# Patient Record
Sex: Female | Born: 1980
Health system: Southern US, Community
[De-identification: ages and names within clinical notes are randomized; demographics above are authoritative.]

## PROBLEM LIST (undated history)

## (undated) ENCOUNTER — Ambulatory Visit: Admission: EM

## (undated) ENCOUNTER — Emergency Department (HOSPITAL_COMMUNITY): Payer: Medicaid Other

## (undated) DIAGNOSIS — Z9889 Other specified postprocedural states: Secondary | ICD-10-CM

## (undated) DIAGNOSIS — F99 Mental disorder, not otherwise specified: Secondary | ICD-10-CM

## (undated) DIAGNOSIS — R002 Palpitations: Secondary | ICD-10-CM

## (undated) DIAGNOSIS — F419 Anxiety disorder, unspecified: Secondary | ICD-10-CM

## (undated) DIAGNOSIS — O139 Gestational [pregnancy-induced] hypertension without significant proteinuria, unspecified trimester: Secondary | ICD-10-CM

## (undated) DIAGNOSIS — R011 Cardiac murmur, unspecified: Secondary | ICD-10-CM

## (undated) DIAGNOSIS — Z9289 Personal history of other medical treatment: Secondary | ICD-10-CM

## (undated) DIAGNOSIS — K219 Gastro-esophageal reflux disease without esophagitis: Secondary | ICD-10-CM

## (undated) HISTORY — DX: Cardiac murmur, unspecified: R01.1

## (undated) HISTORY — DX: Gestational (pregnancy-induced) hypertension without significant proteinuria, unspecified trimester: O13.9

## (undated) HISTORY — DX: Palpitations: R00.2

## (undated) HISTORY — PX: DENTAL SURGERY: SHX609

## (undated) HISTORY — PX: INDUCED ABORTION: SHX677

---

## 2001-10-02 ENCOUNTER — Emergency Department (HOSPITAL_COMMUNITY): Admission: EM | Admit: 2001-10-02 | Discharge: 2001-10-02 | Payer: Self-pay | Admitting: Emergency Medicine

## 2001-10-02 ENCOUNTER — Encounter: Payer: Self-pay | Admitting: Emergency Medicine

## 2003-06-15 ENCOUNTER — Emergency Department (HOSPITAL_COMMUNITY): Admission: EM | Admit: 2003-06-15 | Discharge: 2003-06-15 | Payer: Self-pay | Admitting: *Deleted

## 2004-05-29 ENCOUNTER — Emergency Department (HOSPITAL_COMMUNITY): Admission: EM | Admit: 2004-05-29 | Discharge: 2004-05-29 | Payer: Self-pay | Admitting: *Deleted

## 2006-07-19 ENCOUNTER — Inpatient Hospital Stay (HOSPITAL_COMMUNITY): Admission: EM | Admit: 2006-07-19 | Discharge: 2006-07-23 | Payer: Self-pay | Admitting: Obstetrics and Gynecology

## 2006-10-06 ENCOUNTER — Emergency Department (HOSPITAL_COMMUNITY): Admission: EM | Admit: 2006-10-06 | Discharge: 2006-10-06 | Payer: Self-pay | Admitting: Emergency Medicine

## 2007-05-31 ENCOUNTER — Emergency Department (HOSPITAL_COMMUNITY): Admission: EM | Admit: 2007-05-31 | Discharge: 2007-05-31 | Payer: Self-pay | Admitting: Emergency Medicine

## 2007-12-13 ENCOUNTER — Emergency Department (HOSPITAL_COMMUNITY): Admission: EM | Admit: 2007-12-13 | Discharge: 2007-12-13 | Payer: Self-pay | Admitting: Emergency Medicine

## 2008-02-04 ENCOUNTER — Other Ambulatory Visit: Admission: RE | Admit: 2008-02-04 | Discharge: 2008-02-04 | Payer: Self-pay | Admitting: Obstetrics and Gynecology

## 2008-03-13 ENCOUNTER — Emergency Department (HOSPITAL_COMMUNITY): Admission: EM | Admit: 2008-03-13 | Discharge: 2008-03-13 | Payer: Self-pay | Admitting: Emergency Medicine

## 2008-11-30 ENCOUNTER — Emergency Department (HOSPITAL_COMMUNITY): Admission: EM | Admit: 2008-11-30 | Discharge: 2008-11-30 | Payer: Self-pay | Admitting: Emergency Medicine

## 2009-06-18 ENCOUNTER — Other Ambulatory Visit: Admission: RE | Admit: 2009-06-18 | Discharge: 2009-06-18 | Payer: Self-pay | Admitting: Obstetrics and Gynecology

## 2010-04-10 ENCOUNTER — Emergency Department (HOSPITAL_COMMUNITY): Admission: EM | Admit: 2010-04-10 | Discharge: 2010-04-10 | Payer: Self-pay | Admitting: Emergency Medicine

## 2010-04-12 ENCOUNTER — Emergency Department (HOSPITAL_COMMUNITY): Admission: EM | Admit: 2010-04-12 | Discharge: 2010-04-12 | Payer: Self-pay | Admitting: Emergency Medicine

## 2010-04-23 ENCOUNTER — Emergency Department (HOSPITAL_COMMUNITY): Admission: EM | Admit: 2010-04-23 | Discharge: 2010-04-23 | Payer: Self-pay | Admitting: Emergency Medicine

## 2010-06-15 DIAGNOSIS — Z9289 Personal history of other medical treatment: Secondary | ICD-10-CM

## 2010-06-15 HISTORY — DX: Personal history of other medical treatment: Z92.89

## 2010-06-20 ENCOUNTER — Ambulatory Visit: Payer: Self-pay | Admitting: Cardiology

## 2010-06-27 ENCOUNTER — Encounter: Payer: Self-pay | Admitting: Cardiology

## 2010-06-27 ENCOUNTER — Ambulatory Visit (HOSPITAL_COMMUNITY): Admission: RE | Admit: 2010-06-27 | Discharge: 2010-06-27 | Payer: Self-pay | Admitting: Cardiology

## 2010-06-27 ENCOUNTER — Ambulatory Visit: Payer: Self-pay | Admitting: Cardiology

## 2010-06-27 ENCOUNTER — Ambulatory Visit: Payer: Self-pay

## 2010-07-01 ENCOUNTER — Emergency Department (HOSPITAL_COMMUNITY): Admission: EM | Admit: 2010-07-01 | Discharge: 2010-07-01 | Payer: Self-pay | Admitting: Emergency Medicine

## 2010-07-08 ENCOUNTER — Ambulatory Visit: Payer: Self-pay | Admitting: Cardiology

## 2010-08-09 ENCOUNTER — Emergency Department (HOSPITAL_COMMUNITY): Admission: EM | Admit: 2010-08-09 | Discharge: 2010-08-10 | Payer: Self-pay | Admitting: Emergency Medicine

## 2010-10-12 ENCOUNTER — Inpatient Hospital Stay (HOSPITAL_COMMUNITY)
Admission: AD | Admit: 2010-10-12 | Discharge: 2010-10-12 | Payer: Self-pay | Source: Home / Self Care | Attending: Obstetrics & Gynecology | Admitting: Obstetrics & Gynecology

## 2010-10-12 LAB — URINALYSIS, ROUTINE W REFLEX MICROSCOPIC
Bilirubin Urine: NEGATIVE
Ketones, ur: NEGATIVE mg/dL
Nitrite: NEGATIVE
Protein, ur: NEGATIVE mg/dL
Specific Gravity, Urine: 1.015 (ref 1.005–1.030)
Urine Glucose, Fasting: NEGATIVE mg/dL
Urobilinogen, UA: 0.2 mg/dL (ref 0.0–1.0)
pH: 7 (ref 5.0–8.0)

## 2010-10-12 LAB — WET PREP, GENITAL
Clue Cells Wet Prep HPF POC: NONE SEEN
Trich, Wet Prep: NONE SEEN
Yeast Wet Prep HPF POC: NONE SEEN

## 2010-10-12 LAB — URINE MICROSCOPIC-ADD ON

## 2010-10-13 LAB — URINE CULTURE
Colony Count: 25000
Culture  Setup Time: 201201281740

## 2010-10-14 LAB — GC/CHLAMYDIA PROBE AMP, GENITAL
Chlamydia, DNA Probe: NEGATIVE
GC Probe Amp, Genital: NEGATIVE

## 2010-10-15 ENCOUNTER — Ambulatory Visit (HOSPITAL_COMMUNITY)
Admission: RE | Admit: 2010-10-15 | Discharge: 2010-10-15 | Payer: Self-pay | Source: Home / Self Care | Attending: Obstetrics and Gynecology | Admitting: Obstetrics and Gynecology

## 2010-11-26 LAB — CBC
HCT: 39.8 % (ref 36.0–46.0)
Hemoglobin: 14.1 g/dL (ref 12.0–15.0)
MCH: 32.1 pg (ref 26.0–34.0)
MCHC: 35.4 g/dL (ref 30.0–36.0)
MCV: 90.7 fL (ref 78.0–100.0)
Platelets: 273 10*3/uL (ref 150–400)
RBC: 4.39 MIL/uL (ref 3.87–5.11)
RDW: 12.5 % (ref 11.5–15.5)
WBC: 6.9 10*3/uL (ref 4.0–10.5)

## 2010-11-26 LAB — DIFFERENTIAL
Basophils Absolute: 0 10*3/uL (ref 0.0–0.1)
Basophils Relative: 0 % (ref 0–1)
Eosinophils Absolute: 0 10*3/uL (ref 0.0–0.7)
Eosinophils Relative: 0 % (ref 0–5)
Lymphocytes Relative: 33 % (ref 12–46)
Lymphs Abs: 2.3 10*3/uL (ref 0.7–4.0)
Monocytes Absolute: 0.4 10*3/uL (ref 0.1–1.0)
Monocytes Relative: 6 % (ref 3–12)
Neutro Abs: 4.2 10*3/uL (ref 1.7–7.7)
Neutrophils Relative %: 61 % (ref 43–77)

## 2010-11-26 LAB — GC/CHLAMYDIA PROBE AMP, GENITAL
Chlamydia, DNA Probe: NEGATIVE
GC Probe Amp, Genital: NEGATIVE

## 2010-11-26 LAB — HCG, QUANTITATIVE, PREGNANCY: hCG, Beta Chain, Quant, S: 95713 m[IU]/mL — ABNORMAL HIGH (ref <5)

## 2010-11-26 LAB — RPR: RPR Ser Ql: NONREACTIVE

## 2010-11-26 LAB — WET PREP, GENITAL
Clue Cells Wet Prep HPF POC: NONE SEEN
Trich, Wet Prep: NONE SEEN
Yeast Wet Prep HPF POC: NONE SEEN

## 2010-11-26 LAB — POCT PREGNANCY, URINE: Preg Test, Ur: POSITIVE

## 2010-11-26 LAB — ABO/RH: ABO/RH(D): A POS

## 2010-11-27 LAB — T4, FREE: Free T4: 1.27 ng/dL (ref 0.80–1.80)

## 2010-11-27 LAB — CBC
HCT: 38.7 % (ref 36.0–46.0)
Hemoglobin: 13.3 g/dL (ref 12.0–15.0)
MCH: 32.2 pg (ref 26.0–34.0)
MCHC: 34.3 g/dL (ref 30.0–36.0)
MCV: 93.9 fL (ref 78.0–100.0)
Platelets: 197 10*3/uL (ref 150–400)
RBC: 4.12 MIL/uL (ref 3.87–5.11)
RDW: 13.1 % (ref 11.5–15.5)
WBC: 4.3 10*3/uL (ref 4.0–10.5)

## 2010-11-27 LAB — DIFFERENTIAL
Basophils Absolute: 0 10*3/uL (ref 0.0–0.1)
Basophils Relative: 1 % (ref 0–1)
Eosinophils Absolute: 0 10*3/uL (ref 0.0–0.7)
Eosinophils Relative: 0 % (ref 0–5)
Lymphocytes Relative: 32 % (ref 12–46)
Lymphs Abs: 1.4 10*3/uL (ref 0.7–4.0)
Monocytes Absolute: 0.3 10*3/uL (ref 0.1–1.0)
Monocytes Relative: 7 % (ref 3–12)
Neutro Abs: 2.6 10*3/uL (ref 1.7–7.7)
Neutrophils Relative %: 60 % (ref 43–77)

## 2010-11-27 LAB — TSH: TSH: 1.804 u[IU]/mL (ref 0.350–4.500)

## 2010-11-27 LAB — D-DIMER, QUANTITATIVE: D-Dimer, Quant: 0.27 ug/mL-FEU (ref 0.00–0.48)

## 2010-11-30 LAB — BASIC METABOLIC PANEL
BUN: 7 mg/dL (ref 6–23)
CO2: 23 mEq/L (ref 19–32)
Calcium: 9 mg/dL (ref 8.4–10.5)
Chloride: 104 mEq/L (ref 96–112)
Creatinine, Ser: 0.72 mg/dL (ref 0.4–1.2)
GFR calc Af Amer: >60 mL/min (ref >60)
GFR calc non Af Amer: >60 mL/min (ref >60)
Glucose, Bld: 88 mg/dL (ref 70–99)
Potassium: 3.7 mEq/L (ref 3.5–5.1)
Sodium: 135 mEq/L (ref 135–145)

## 2010-11-30 LAB — DIFFERENTIAL
Basophils Absolute: 0 10*3/uL (ref 0.0–0.1)
Basophils Relative: 0 % (ref 0–1)
Eosinophils Absolute: 0 10*3/uL (ref 0.0–0.7)
Eosinophils Relative: 0 % (ref 0–5)
Lymphocytes Relative: 35 % (ref 12–46)
Lymphs Abs: 1.3 10*3/uL (ref 0.7–4.0)
Monocytes Absolute: 0.2 10*3/uL (ref 0.1–1.0)
Monocytes Relative: 5 % (ref 3–12)
Neutro Abs: 2.1 10*3/uL (ref 1.7–7.7)
Neutrophils Relative %: 59 % (ref 43–77)

## 2010-11-30 LAB — CBC
HCT: 41.8 % (ref 36.0–46.0)
Hemoglobin: 14.1 g/dL (ref 12.0–15.0)
MCH: 32 pg (ref 26.0–34.0)
MCHC: 33.7 g/dL (ref 30.0–36.0)
MCV: 95 fL (ref 78.0–100.0)
Platelets: 224 10*3/uL (ref 150–400)
RBC: 4.4 MIL/uL (ref 3.87–5.11)
RDW: 12.5 % (ref 11.5–15.5)
WBC: 3.6 10*3/uL — ABNORMAL LOW (ref 4.0–10.5)

## 2010-11-30 LAB — URINE CULTURE
Colony Count: NO GROWTH
Culture: NO GROWTH

## 2010-11-30 LAB — URINALYSIS, ROUTINE W REFLEX MICROSCOPIC
Bilirubin Urine: NEGATIVE
Bilirubin Urine: NEGATIVE
Glucose, UA: NEGATIVE mg/dL
Glucose, UA: NEGATIVE mg/dL
Ketones, ur: NEGATIVE mg/dL
Ketones, ur: NEGATIVE mg/dL
Leukocytes, UA: NEGATIVE
Leukocytes, UA: NEGATIVE
Nitrite: NEGATIVE
Nitrite: NEGATIVE
Protein, ur: NEGATIVE mg/dL
Protein, ur: NEGATIVE mg/dL
Specific Gravity, Urine: 1.02 (ref 1.005–1.030)
Specific Gravity, Urine: <1.005 — ABNORMAL LOW (ref 1.005–1.030)
Urobilinogen, UA: 0.2 mg/dL (ref 0.0–1.0)
Urobilinogen, UA: 0.2 mg/dL (ref 0.0–1.0)
pH: 6 (ref 5.0–8.0)
pH: 6 (ref 5.0–8.0)

## 2010-11-30 LAB — URINE MICROSCOPIC-ADD ON

## 2010-11-30 LAB — GC/CHLAMYDIA PROBE AMP, GENITAL
Chlamydia, DNA Probe: NEGATIVE
GC Probe Amp, Genital: NEGATIVE

## 2010-11-30 LAB — WET PREP, GENITAL
Trich, Wet Prep: NONE SEEN
Yeast Wet Prep HPF POC: NONE SEEN

## 2010-11-30 LAB — POCT PREGNANCY, URINE
Preg Test, Ur: NEGATIVE
Preg Test, Ur: NEGATIVE

## 2011-01-20 ENCOUNTER — Emergency Department (HOSPITAL_COMMUNITY)
Admission: EM | Admit: 2011-01-20 | Discharge: 2011-01-20 | Disposition: A | Discharge status: Home / Self Care | Payer: Medicaid Other | Attending: Emergency Medicine | Admitting: Emergency Medicine

## 2011-01-20 DIAGNOSIS — O210 Mild hyperemesis gravidarum: Secondary | ICD-10-CM | POA: Insufficient documentation

## 2011-01-20 LAB — CBC
HCT: 32.1 % — ABNORMAL LOW (ref 36.0–46.0)
Hemoglobin: 10.8 g/dL — ABNORMAL LOW (ref 12.0–15.0)
MCH: 31 pg (ref 26.0–34.0)
MCHC: 33.6 g/dL (ref 30.0–36.0)
MCV: 92.2 fL (ref 78.0–100.0)
Platelets: 223 10*3/uL (ref 150–400)
RBC: 3.48 MIL/uL — ABNORMAL LOW (ref 3.87–5.11)
RDW: 13.4 % (ref 11.5–15.5)
WBC: 6.5 10*3/uL (ref 4.0–10.5)

## 2011-01-20 LAB — DIFFERENTIAL
Band Neutrophils: 0 % (ref 0–10)
Basophils Absolute: 0 10*3/uL (ref 0.0–0.1)
Basophils Relative: 0 % (ref 0–1)
Blasts: 0 %
Eosinophils Absolute: 0 10*3/uL (ref 0.0–0.7)
Eosinophils Relative: 0 % (ref 0–5)
Lymphocytes Relative: 13 % (ref 12–46)
Lymphs Abs: 0.8 10*3/uL (ref 0.7–4.0)
Metamyelocytes Relative: 0 %
Monocytes Absolute: 0.4 10*3/uL (ref 0.1–1.0)
Monocytes Relative: 6 % (ref 3–12)
Myelocytes: 0 %
Neutro Abs: 5.3 10*3/uL (ref 1.7–7.7)
Neutrophils Relative %: 81 % — ABNORMAL HIGH (ref 43–77)
Promyelocytes Absolute: 0 %

## 2011-01-20 LAB — URINALYSIS, ROUTINE W REFLEX MICROSCOPIC
Glucose, UA: 100 mg/dL — AB
Hgb urine dipstick: NEGATIVE
Ketones, ur: >80 mg/dL — AB
Nitrite: NEGATIVE
Specific Gravity, Urine: 1.02 (ref 1.005–1.030)
Urobilinogen, UA: 2 mg/dL — ABNORMAL HIGH (ref 0.0–1.0)
pH: 6.5 (ref 5.0–8.0)

## 2011-01-20 LAB — BASIC METABOLIC PANEL
BUN: 7 mg/dL (ref 6–23)
CO2: 25 mEq/L (ref 19–32)
Calcium: 9.3 mg/dL (ref 8.4–10.5)
Chloride: 100 mEq/L (ref 96–112)
Creatinine, Ser: <0.47 mg/dL (ref 0.4–1.2)
Glucose, Bld: 77 mg/dL (ref 70–99)
Potassium: 3.4 mEq/L — ABNORMAL LOW (ref 3.5–5.1)
Sodium: 136 mEq/L (ref 135–145)

## 2011-01-20 LAB — URINE MICROSCOPIC-ADD ON

## 2011-01-31 NOTE — Op Note (Signed)
NAMECAILAH, REACH NO.:  [id number]   MEDICAL RECORD NO.:  [id number]          PATIENT TYPE:  INP   LOCATION:  LDR1                          FACILITY:  [facility]   PHYSICIAN:  Tilda Burrow, M.D. DATE OF BIRTH:  03-21-1981   DATE OF PROCEDURE:  DATE OF DISCHARGE:                                 OPERATIVE REPORT   ONSET OF LABOR:  07/20/2006 at 7:00 a.m.   DATE OF DELIVERY:  07/20/2006 at 1330.   LENGTH OF FIRST STAGE LABOR:  4 hours and 50 minutes.   LENGTH OF SECOND STAGE LABOR:  1 hour and 50 minutes.   LENGTH OF THIRD STAGE LABOR:  10 minutes.   DELIVERY NOTE:  Wynona had a normal spontaneous delivery of a viable female  infant.  Upon delivery of head, there was an occult arm which was reduced  and posterior shoulder delivered first with spontaneous delivery of rest of  infant without difficulty (under epidural anesthesia).  Third stage of labor  was actively managed with 20 units of Pitocin and 7 mL of D5LR at a rapid  rate.  Placenta was delivered spontaneously via Tomasa Blase mechanism.  A three-  vessel cord is noted.  Cord blood gas and cord blood was obtained. There was  a 1 degree right labial laceration that did not repair a first degree  perineal laceration which required several stitches to tie off the bleeder.  After that, good  hemostasis was obtained.  Epidural catheter was removed with blue tip intact  and infant and mother transferred up to the postpartum unit in stable  condition.  Estimated blood loss approximately 450 mL.   Epidural Catheter removed , tip  intact.      Zerita Boers, Lanier Clam      Tilda Burrow, M.D.  Electronically Signed    DL/MEDQ  D:  25/[id number]/[id number]  T:  07/20/2006  Job:  [id number]   cc:   Francoise Schaumann. Raynelle Highland  Fax: [phone]   Tilda Burrow, M.D.  Fax: [phone]

## 2011-01-31 NOTE — H&P (Signed)
NAMEJONNIE, KUBLY NO.:  [id number]   MEDICAL RECORD NO.:  [id number]            PATIENT TYPE:   LOCATION:                                 FACILITY:   PHYSICIAN:  Tilda Burrow, M.D. DATE OF BIRTH:  Nov 20, 1980   DATE OF ADMISSION:  07/19/2006  DATE OF DISCHARGE:  LH                                HISTORY & PHYSICAL   CHIEF COMPLAINT:  Induction of labor secondary to post dates.  She will be a  patient in the hospital on Sunday November 4th, 2007.   HISTORY OF PRESENT ILLNESS:  Chessa is a 30 year old gravida 1, para 0 with  an EDC of 07/14/2006 based on second trimester ultrasound which did not  correlate with a last menstrual period of July 01, 2006, placing her at  40 weeks 5 days gestation.  She began prenatal care at approximately 18  weeks and has had regular visits since then.  Prenatal course has basically  been uneventful.  Blood type is A+, rubella immune.  HB SAG, HIV, RPR,  Gonorrhea, Chlamydia and Group B strep are all negative.  Sickle cell was  negative.  One-hour GTT was normal at 78.  Blood pressures have been 100 to  130s/60s to 80s.  Total weight gain has been 30 pounds with appropriate  fundal height growth.   PAST MEDICAL HISTORY:  Positive for heart murmur as a child.   SURGICAL HISTORY:  None.   ALLERGIES:  NO KNOWN DRUG ALLERGIES.   MEDICATIONS:  Prenatal vitamins.   SOCIAL HISTORY:  Single, lives with mom, works at [employer].  Father of the  baby is estranged and lives in [place] but is supportive of the pregnancy  and plans to be here for the delivery.   FAMILY HISTORY:  Positive for hypertension, diabetes and CAD.   PHYSICAL EXAM:  HEENT:  Within normal limits.  HEART:  Regular rate and rhythm.  No murmur auscultated.  LUNGS:  Were clear.  ABDOMEN:  Soft and nontender.  Fundal height 40 cm.  Estimated fetal weight  7-1/2 pounds.  CERVICAL:  Exam on July 14, 2006 is closed, short, anterior, zero  station, vertex  presentation.  LEGS:  Negative.   IMPRESSION:  IUP at 40 week,s 5 days, post dates, induction of labor.  Risk  and benefits of induction of labor were discussed versus spontaneous labor  including the same possibility for cesarean section, in addition to the  possibility of a C-section due to failed induction.  She readily accepts  this risk and feel the benefits outweigh.   PLAN:  Admit to the hospital on the evening of Sunday November 3.  If her  cervix is still closed, we will do a Cytotec.  However, if it is dilated a  little, will do a Foley bulb pre-induction.      Jacklyn Shell, C.N.M.      Tilda Burrow, M.D.  Electronically Signed    FC/MEDQ  D:  07/14/2006  T:  07/15/2006  Job:  [id number]

## 2011-01-31 NOTE — Op Note (Signed)
NAMEROSANN, GORUM NO.:  [id number]   MEDICAL RECORD NO.:  [id number]          PATIENT TYPE:  INP   LOCATION:  A413                          FACILITY:  [facility]   PHYSICIAN:  Tilda Burrow, M.D. DATE OF BIRTH:  [birth year]   DATE OF PROCEDURE:  07/20/2006  DATE OF DISCHARGE:  07/23/2006                               OPERATIVE REPORT   PROCEDURE:  Continuous lumbar epidural catheter placement performed at  approximately 8:30 a.m. using a loss of resistance technique at the L2-3  interspace.   INFORMED CONSENT:  The patient signed for pre-consents.   SURGEON:  Tilda Burrow, M.D.   DESCRIPTION OF PROCEDURE:  The patient had identification of the  epidural space after prepping and draping by using a loss of resistance  technique.  Xylocaine 1.5% with epinephrine, 5 mL, was infused, followed  by insertion of the epidural catheter 3 cm into the epidural space,  followed by a bolus of 70 mL of 0.125% Marcaine, followed by 14 mL per  hour of continuous infusion.  The catheter was taped to the back and  functioning appropriately upon completion.      Tilda Burrow, M.D.  Electronically Signed     JVF/MEDQ  D:  09/01/2006  T:  09/02/2006  Job:  [id number]

## 2011-03-10 ENCOUNTER — Inpatient Hospital Stay (HOSPITAL_COMMUNITY)
Admission: EM | Admit: 2011-03-10 | Discharge: 2011-03-12 | DRG: 775 | Disposition: A | Discharge status: Home / Self Care | Payer: Medicaid Other | Source: Ambulatory Visit | Attending: Obstetrics & Gynecology | Admitting: Obstetrics & Gynecology

## 2011-03-10 ENCOUNTER — Emergency Department (HOSPITAL_COMMUNITY)
Admission: EM | Admit: 2011-03-10 | Discharge: 2011-03-10 | Disposition: A | Discharge status: Nursing Home (Includes ICF) | Payer: Medicaid Other | Source: Home / Self Care | Attending: Emergency Medicine | Admitting: Emergency Medicine

## 2011-03-10 LAB — URINALYSIS, ROUTINE W REFLEX MICROSCOPIC
Bilirubin Urine: NEGATIVE
Glucose, UA: NEGATIVE mg/dL
Hgb urine dipstick: NEGATIVE
Ketones, ur: NEGATIVE mg/dL
Leukocytes, UA: NEGATIVE
Nitrite: NEGATIVE
Protein, ur: NEGATIVE mg/dL
Specific Gravity, Urine: 1.015 (ref 1.005–1.030)
Urobilinogen, UA: 0.2 mg/dL (ref 0.0–1.0)
pH: 7 (ref 5.0–8.0)

## 2011-03-10 LAB — RAPID URINE DRUG SCREEN, HOSP PERFORMED
Amphetamines: NOT DETECTED
Barbiturates: NOT DETECTED
Benzodiazepines: NOT DETECTED
Cocaine: NOT DETECTED
Opiates: NOT DETECTED
Tetrahydrocannabinol: NOT DETECTED

## 2011-03-10 LAB — CBC
HCT: 33.2 % — ABNORMAL LOW (ref 36.0–46.0)
Hemoglobin: 10.6 g/dL — ABNORMAL LOW (ref 12.0–15.0)
MCH: 29.9 pg (ref 26.0–34.0)
MCHC: 31.9 g/dL (ref 30.0–36.0)
MCV: 93.8 fL (ref 78.0–100.0)
Platelets: 242 10*3/uL (ref 150–400)
RBC: 3.54 MIL/uL — ABNORMAL LOW (ref 3.87–5.11)
RDW: 15.1 % (ref 11.5–15.5)
WBC: 11.4 10*3/uL — ABNORMAL HIGH (ref 4.0–10.5)

## 2011-03-10 LAB — RPR: RPR Ser Ql: NONREACTIVE

## 2011-03-10 LAB — PREGNANCY, URINE: Preg Test, Ur: POSITIVE

## 2011-03-11 LAB — CBC
HCT: 27.2 % — ABNORMAL LOW (ref 36.0–46.0)
Hemoglobin: 8.7 g/dL — ABNORMAL LOW (ref 12.0–15.0)
MCH: 30.2 pg (ref 26.0–34.0)
MCHC: 32 g/dL (ref 30.0–36.0)
MCV: 94.4 fL (ref 78.0–100.0)
Platelets: 237 10*3/uL (ref 150–400)
RBC: 2.88 MIL/uL — ABNORMAL LOW (ref 3.87–5.11)
RDW: 15.3 % (ref 11.5–15.5)
WBC: 13.4 10*3/uL — ABNORMAL HIGH (ref 4.0–10.5)

## 2011-06-12 LAB — DIFFERENTIAL
Basophils Absolute: 0
Basophils Relative: 1
Eosinophils Absolute: 0
Eosinophils Relative: 0
Lymphocytes Relative: 41
Lymphs Abs: 2.2
Monocytes Absolute: 0.2
Monocytes Relative: 4
Neutro Abs: 2.9
Neutrophils Relative %: 54

## 2011-06-12 LAB — CBC
HCT: 40.9
Hemoglobin: 14
MCHC: 34.3
MCV: 93.4
Platelets: 299
RBC: 4.38
RDW: 13.1
WBC: 5.3

## 2011-06-12 LAB — BASIC METABOLIC PANEL
BUN: 13
CO2: 27
Calcium: 9.5
Chloride: 105
Creatinine, Ser: 0.78
GFR calc Af Amer: >60
GFR calc non Af Amer: >60
Glucose, Bld: 84
Potassium: 3.4 — ABNORMAL LOW
Sodium: 138

## 2011-07-07 ENCOUNTER — Emergency Department (HOSPITAL_COMMUNITY)
Admission: EM | Admit: 2011-07-07 | Discharge: 2011-07-07 | Disposition: A | Discharge status: Home / Self Care | Payer: Medicaid Other | Attending: Emergency Medicine | Admitting: Emergency Medicine

## 2011-07-07 ENCOUNTER — Encounter: Payer: Self-pay | Admitting: *Deleted

## 2011-07-07 DIAGNOSIS — R1013 Epigastric pain: Secondary | ICD-10-CM | POA: Insufficient documentation

## 2011-07-07 DIAGNOSIS — R112 Nausea with vomiting, unspecified: Secondary | ICD-10-CM | POA: Insufficient documentation

## 2011-07-07 DIAGNOSIS — R10816 Epigastric abdominal tenderness: Secondary | ICD-10-CM | POA: Insufficient documentation

## 2011-07-07 DIAGNOSIS — R000 Tachycardia, unspecified: Secondary | ICD-10-CM | POA: Insufficient documentation

## 2011-07-07 DIAGNOSIS — R1110 Vomiting, unspecified: Secondary | ICD-10-CM | POA: Insufficient documentation

## 2011-07-07 LAB — DIFFERENTIAL
Basophils Absolute: 0 10*3/uL (ref 0.0–0.1)
Basophils Relative: 0 % (ref 0–1)
Eosinophils Absolute: 0 10*3/uL (ref 0.0–0.7)
Eosinophils Relative: 0 % (ref 0–5)
Lymphocytes Relative: 22 % (ref 12–46)
Lymphs Abs: 1.8 10*3/uL (ref 0.7–4.0)
Monocytes Absolute: 0.4 10*3/uL (ref 0.1–1.0)
Monocytes Relative: 5 % (ref 3–12)
Neutro Abs: 6.1 10*3/uL (ref 1.7–7.7)
Neutrophils Relative %: 73 % (ref 43–77)

## 2011-07-07 LAB — URINALYSIS, ROUTINE W REFLEX MICROSCOPIC
Bilirubin Urine: NEGATIVE
Glucose, UA: NEGATIVE mg/dL
Ketones, ur: NEGATIVE mg/dL
Nitrite: NEGATIVE
Protein, ur: NEGATIVE mg/dL
Specific Gravity, Urine: 1.02 (ref 1.005–1.030)
Urobilinogen, UA: 0.2 mg/dL (ref 0.0–1.0)
pH: 5.5 (ref 5.0–8.0)

## 2011-07-07 LAB — CBC
HCT: 42.6 % (ref 36.0–46.0)
Hemoglobin: 14 g/dL (ref 12.0–15.0)
MCH: 31.5 pg (ref 26.0–34.0)
MCHC: 32.9 g/dL (ref 30.0–36.0)
MCV: 95.7 fL (ref 78.0–100.0)
Platelets: 271 10*3/uL (ref 150–400)
RBC: 4.45 MIL/uL (ref 3.87–5.11)
RDW: 13.9 % (ref 11.5–15.5)
WBC: 8.4 10*3/uL (ref 4.0–10.5)

## 2011-07-07 LAB — BASIC METABOLIC PANEL
BUN: 9 mg/dL (ref 6–23)
CO2: 25 mEq/L (ref 19–32)
Calcium: 9.8 mg/dL (ref 8.4–10.5)
Chloride: 102 mEq/L (ref 96–112)
Creatinine, Ser: 0.71 mg/dL (ref 0.50–1.10)
GFR calc Af Amer: >90 mL/min (ref >90)
GFR calc non Af Amer: >90 mL/min (ref >90)
Glucose, Bld: 88 mg/dL (ref 70–99)
Potassium: 3.8 mEq/L (ref 3.5–5.1)
Sodium: 137 mEq/L (ref 135–145)

## 2011-07-07 LAB — PREGNANCY, URINE: Preg Test, Ur: NEGATIVE

## 2011-07-07 LAB — URINE MICROSCOPIC-ADD ON

## 2011-07-07 MED ORDER — PROMETHAZINE HCL 25 MG PO TABS: 25.0000 mg | ORAL_TABLET | Freq: Four times a day (QID) | ORAL | Status: DC | PRN

## 2011-07-07 MED ORDER — ONDANSETRON HCL 4 MG/2ML IJ SOLN: 4.0000 mg | Freq: Once | INTRAMUSCULAR | Status: DC

## 2011-07-07 MED ORDER — PANTOPRAZOLE SODIUM 40 MG IV SOLR: 40.0000 mg | Freq: Once | INTRAVENOUS | Status: DC

## 2011-07-07 MED ORDER — RANITIDINE HCL 150 MG PO CAPS: 150.0000 mg | ORAL_CAPSULE | Freq: Two times a day (BID) | ORAL | Status: DC

## 2011-07-07 MED ORDER — SODIUM CHLORIDE 0.9 % IV BOLUS (SEPSIS): 1000.0000 mL | Freq: Once | INTRAVENOUS | Status: DC

## 2011-07-07 NOTE — ED Notes (Signed)
Pt states nausea and vomiting. Burning sensation to upper abdomen at times.

## 2011-07-07 NOTE — ED Provider Notes (Signed)
History   This chart was scribed for Benny Lennert, MD by Clarita Crane. The patient was seen in room [facility] and the patient's care was started at 1:41PM.   CSN: [id number] Arrival date & time: 07/07/2011 12:54 PM   First MD Initiated Contact with Patient 07/07/11 1325      Chief Complaint  Patient presents with  . Emesis   HPI Paula Riley is a 30 y.o. female who presents to the Emergency Department complaining of constant moderate nausea and vomiting with associated epigastric abdominal pain described as burning and decreased appetite onset several days ago and persistent since. Patient reports having 1 episode of vomiting today. Denies diarrhea, fever, chest pain, HA.   HPI ELEMENTS:   Onset: several days ago Duration: persistent since onset  Timing: constant   Severity: moderate  Context:  as above  Associated symptoms: +abdominal pain, decreased appetite.   Denies diarrhea, fever, chest pain, HA.   History reviewed. No pertinent past medical history.  Past Surgical History  Procedure Date  . Dental surgery     No family history on file.  History  Substance Use Topics  . Smoking status: Never Smoker   . Smokeless tobacco: Not on file  . Alcohol Use: No    OB History    Grav Para Term Preterm Abortions TAB SAB Ect Mult Living                  Review of Systems  Constitutional: Negative for fatigue.  HENT: Negative for congestion, sinus pressure and ear discharge.   Eyes: Negative for discharge.  Respiratory: Negative for cough.   Cardiovascular: Negative for chest pain.  Gastrointestinal: Positive for nausea, vomiting and abdominal pain. Negative for diarrhea and blood in stool.  Genitourinary: Negative for frequency and hematuria.  Musculoskeletal: Negative for back pain.  Skin: Negative for rash.  Neurological: Negative for seizures and headaches.  Hematological: Negative.   Psychiatric/Behavioral: Negative for hallucinations.    Allergies    Review of patient's allergies indicates no known allergies.  Home Medications   Current Outpatient Rx  Name Route Sig Dispense Refill  . MAGNESIUM 30 MG PO TABS Oral Take 30 mg by mouth daily.      . MULTIVITAMINS PO TABS Oral Take 1 tablet by mouth daily.      Marland Kitchen PROMETHAZINE HCL 25 MG PO TABS Oral Take 1 tablet (25 mg total) by mouth every 6 (six) hours as needed for nausea. 15 tablet 0  . RANITIDINE HCL 150 MG PO CAPS Oral Take 1 capsule (150 mg total) by mouth 2 (two) times daily. 30 capsule 0    BP 149/82  Pulse 107  Temp(Src) 97.7 F (36.5 C) (Oral)  Resp 18  Ht 5\' 5"  (1.651 m)  Wt 118 lb (53.524 kg)  BMI 19.64 kg/m2  SpO2 100%  Physical Exam  Nursing note and vitals reviewed. Constitutional: She is oriented to person, place, and time. She appears well-developed and well-nourished. No distress.  HENT:  Head: Normocephalic and atraumatic.  Eyes: EOM are normal. No scleral icterus.  Neck: Neck supple.  Cardiovascular: Regular rhythm and intact distal pulses.  Tachycardia present.  Exam reveals no gallop and no friction rub.   No murmur heard. Pulmonary/Chest: Effort normal. No stridor. No respiratory distress. She has no wheezes. She has no rales.  Abdominal: Soft. She exhibits no distension. There is tenderness in the epigastric area. There is no rebound.  Musculoskeletal: Normal range of motion. She exhibits  no edema.  Neurological: She is alert and oriented to person, place, and time. Coordination normal.  Skin: Skin is warm and dry.  Psychiatric: She has a normal mood and affect. Her behavior is normal.    ED Course  Procedures (including critical care time)  DIAGNOSTIC STUDIES: Oxygen Saturation is 100% on room air, normal by my interpretation.    COORDINATION OF CARE: 3:09Pm- Patient informed of lab results and intent to d/c home. Recommend follow up with PCP.   Results for orders placed during the hospital encounter of 07/07/11  CBC      Component Value  Range   WBC 8.4  4.0 - 10.5 (K/uL)   RBC 4.45  3.87 - 5.11 (MIL/uL)   Hemoglobin 14.0  12.0 - 15.0 (g/dL)   HCT 40.9  81.1 - 91.4 (%)   MCV 95.7  78.0 - 100.0 (fL)   MCH 31.5  26.0 - 34.0 (pg)   MCHC 32.9  30.0 - 36.0 (g/dL)   RDW 78.2  95.6 - 21.3 (%)   Platelets 271  150 - 400 (K/uL)  DIFFERENTIAL      Component Value Range   Neutrophils Relative 73  43 - 77 (%)   Neutro Abs 6.1  1.7 - 7.7 (K/uL)   Lymphocytes Relative 22  12 - 46 (%)   Lymphs Abs 1.8  0.7 - 4.0 (K/uL)   Monocytes Relative 5  3 - 12 (%)   Monocytes Absolute 0.4  0.1 - 1.0 (K/uL)   Eosinophils Relative 0  0 - 5 (%)   Eosinophils Absolute 0.0  0.0 - 0.7 (K/uL)   Basophils Relative 0  0 - 1 (%)   Basophils Absolute 0.0  0.0 - 0.1 (K/uL)  BASIC METABOLIC PANEL      Component Value Range   Sodium 137  135 - 145 (mEq/L)   Potassium 3.8  3.5 - 5.1 (mEq/L)   Chloride 102  96 - 112 (mEq/L)   CO2 25  19 - 32 (mEq/L)   Glucose, Bld 88  70 - 99 (mg/dL)   BUN 9  6 - 23 (mg/dL)   Creatinine, Ser 0.86  0.50 - 1.10 (mg/dL)   Calcium 9.8  8.4 - 57.8 (mg/dL)   GFR calc non Af Amer >90  >90 (mL/min)   GFR calc Af Amer >90  >90 (mL/min)  URINALYSIS, ROUTINE W REFLEX MICROSCOPIC      Component Value Range   Color, Urine YELLOW  YELLOW    Appearance HAZY (*) CLEAR    Specific Gravity, Urine 1.020  1.005 - 1.030    pH 5.5  5.0 - 8.0    Glucose, UA NEGATIVE  NEGATIVE (mg/dL)   Hgb urine dipstick MODERATE (*) NEGATIVE    Bilirubin Urine NEGATIVE  NEGATIVE    Ketones, ur NEGATIVE  NEGATIVE (mg/dL)   Protein, ur NEGATIVE  NEGATIVE (mg/dL)   Urobilinogen, UA 0.2  0.0 - 1.0 (mg/dL)   Nitrite NEGATIVE  NEGATIVE    Leukocytes, UA TRACE (*) NEGATIVE   PREGNANCY, URINE      Component Value Range   Preg Test, Ur NEGATIVE    URINE MICROSCOPIC-ADD ON      Component Value Range   Squamous Epithelial / LPF FEW (*) RARE    WBC, UA 0-2  <3 (WBC/hpf)   RBC / HPF 0-2  <3 (RBC/hpf)   Bacteria, UA RARE  RARE    No results  found.   1. Vomiting  MDM  Vomiting.  Gastritis,  gastroenteritis   The chart was scribed for me under my direct supervision.  I personally performed the history, physical, and medical decision making and all procedures in the evaluation of this patient.Benny Lennert, MD 07/07/11 [phone]

## 2011-07-12 ENCOUNTER — Encounter (HOSPITAL_COMMUNITY): Payer: Self-pay | Admitting: *Deleted

## 2011-07-12 ENCOUNTER — Emergency Department (HOSPITAL_COMMUNITY)
Admission: EM | Admit: 2011-07-12 | Discharge: 2011-07-12 | Disposition: A | Discharge status: Home / Self Care | Payer: Medicaid Other | Attending: Emergency Medicine | Admitting: Emergency Medicine

## 2011-07-12 DIAGNOSIS — R109 Unspecified abdominal pain: Secondary | ICD-10-CM | POA: Insufficient documentation

## 2011-07-12 DIAGNOSIS — K219 Gastro-esophageal reflux disease without esophagitis: Secondary | ICD-10-CM

## 2011-07-12 DIAGNOSIS — R110 Nausea: Secondary | ICD-10-CM | POA: Insufficient documentation

## 2011-07-12 DIAGNOSIS — R10816 Epigastric abdominal tenderness: Secondary | ICD-10-CM | POA: Insufficient documentation

## 2011-07-12 LAB — URINALYSIS, ROUTINE W REFLEX MICROSCOPIC
Bilirubin Urine: NEGATIVE
Glucose, UA: NEGATIVE mg/dL
Ketones, ur: NEGATIVE mg/dL
Leukocytes, UA: NEGATIVE
Nitrite: NEGATIVE
Protein, ur: NEGATIVE mg/dL
Specific Gravity, Urine: 1.02 (ref 1.005–1.030)
Urobilinogen, UA: 0.2 mg/dL (ref 0.0–1.0)
pH: 6 (ref 5.0–8.0)

## 2011-07-12 LAB — URINE MICROSCOPIC-ADD ON

## 2011-07-12 LAB — POCT PREGNANCY, URINE: Preg Test, Ur: NEGATIVE

## 2011-07-12 MED ORDER — METOCLOPRAMIDE HCL 10 MG PO TABS
10.0000 mg | ORAL_TABLET | Freq: Once | ORAL | Status: AC
  Administered 2011-07-12: 10 mg via ORAL
  Filled 2011-07-12: qty 1

## 2011-07-12 MED ORDER — PANTOPRAZOLE SODIUM 40 MG PO TBEC: 40.0000 mg | DELAYED_RELEASE_TABLET | Freq: Every day | ORAL | Status: DC

## 2011-07-12 MED ORDER — METOCLOPRAMIDE HCL 10 MG PO TABS: 10.0000 mg | ORAL_TABLET | Freq: Four times a day (QID) | ORAL | Status: AC

## 2011-07-12 NOTE — ED Provider Notes (Signed)
Medical screening examination/treatment/procedure(s) were performed by non-physician practitioner and as supervising physician I was immediately available for consultation/collaboration.  Doug Sou, MD 07/12/11 [phone]

## 2011-07-12 NOTE — ED Provider Notes (Signed)
History     CSN: [id number] Arrival date & time: 07/12/2011  8:55 AM   First MD Initiated Contact with Patient 07/12/11 [phone]      Chief Complaint  Patient presents with  . Nausea  . Flank Pain  . Gastrophageal Reflux    (Consider location/radiation/quality/duration/timing/severity/associated sxs/prior treatment) HPI Comments: Patient c/o persistent nausea this week.  She was seen in this ED 5 days ago with similar symptoms.  Had vomiting then that has since resolved but still c/o nausea and burning epigastric pain that radiates from her stomach to her chest.  She also c/o left flank pain intermittently.  She denies fever, diarrhea, dysuria, vaginal bleeding or discharge.    Patient is a 30 y.o. female presenting with GERD. The history is provided by the patient.  Gastrophageal Reflux This is a recurrent problem. The current episode started in the past 7 days. The problem occurs constantly. The problem has been unchanged. Associated symptoms include abdominal pain and nausea. Pertinent negatives include no anorexia, arthralgias, chest pain, chills, congestion, coughing, fatigue, fever, headaches, myalgias, neck pain, numbness, rash, sore throat, swollen glands, urinary symptoms, vomiting or weakness. The symptoms are aggravated by nothing. Treatments tried: antiacids. The treatment provided no relief.    History reviewed. No pertinent past medical history.  Past Surgical History  Procedure Date  . Dental surgery     History reviewed. No pertinent family history.  History  Substance Use Topics  . Smoking status: Never Smoker   . Smokeless tobacco: Not on file  . Alcohol Use: No    OB History    Grav Para Term Preterm Abortions TAB SAB Ect Mult Living                  Review of Systems  Constitutional: Negative for fever, chills, activity change, appetite change and fatigue.  HENT: Negative for congestion, sore throat, trouble swallowing, neck pain and neck stiffness.     Respiratory: Negative for cough, shortness of breath and wheezing.   Cardiovascular: Negative for chest pain and palpitations.  Gastrointestinal: Positive for nausea and abdominal pain. Negative for vomiting, diarrhea, constipation, blood in stool and anorexia.  Genitourinary: Negative for dysuria, frequency, hematuria, flank pain, decreased urine volume, vaginal bleeding, vaginal discharge and difficulty urinating.  Musculoskeletal: Negative for myalgias, back pain and arthralgias.  Skin: Negative for rash.  Neurological: Negative for dizziness, weakness, numbness and headaches.  Hematological: Does not bruise/bleed easily.  All other systems reviewed and are negative.    Allergies  Review of patient's allergies indicates no known allergies.  Home Medications   Current Outpatient Rx  Name Route Sig Dispense Refill  . MAGNESIUM 30 MG PO TABS Oral Take 30 mg by mouth daily.      . MULTIVITAMINS PO TABS Oral Take 1 tablet by mouth daily.      Marland Kitchen PROMETHAZINE HCL 25 MG PO TABS Oral Take 1 tablet (25 mg total) by mouth every 6 (six) hours as needed for nausea. 15 tablet 0  . RANITIDINE HCL 150 MG PO CAPS Oral Take 1 capsule (150 mg total) by mouth 2 (two) times daily. 30 capsule 0    BP 135/83  Pulse 110  Temp(Src) 98.5 F (36.9 C) (Oral)  Resp 18  Ht 5\' 5"  (1.651 m)  Wt 118 lb (53.524 kg)  BMI 19.64 kg/m2  SpO2 100%  Physical Exam  Nursing note and vitals reviewed. Constitutional: She is oriented to person, place, and time. She appears well-developed and well-nourished.  Non-toxic appearance. She does not have a sickly appearance. She does not appear ill. No distress.  HENT:  Head: Normocephalic and atraumatic.  Mouth/Throat: Oropharynx is clear and moist.  Neck: Normal range of motion. Neck supple.  Cardiovascular: Normal rate, regular rhythm and normal heart sounds.   Pulmonary/Chest: Effort normal and breath sounds normal. No respiratory distress. She exhibits no  tenderness.  Abdominal: Soft. Normal appearance and bowel sounds are normal. She exhibits no distension and no mass. There is no hepatosplenomegaly. There is tenderness in the epigastric area. There is no rigidity, no rebound, no guarding, no CVA tenderness and no tenderness at McBurney's point.  Musculoskeletal: Normal range of motion. She exhibits no tenderness.  Lymphadenopathy:    She has no cervical adenopathy.  Neurological: She is alert and oriented to person, place, and time. No cranial nerve deficit. She exhibits normal muscle tone. Coordination normal.  Skin: Skin is warm and dry.    ED Course  Procedures (including critical care time)  Results for orders placed during the hospital encounter of 07/12/11  URINALYSIS, ROUTINE W REFLEX MICROSCOPIC      Component Value Range   Color, Urine YELLOW  YELLOW    Appearance CLEAR  CLEAR    Specific Gravity, Urine 1.020  1.005 - 1.030    pH 6.0  5.0 - 8.0    Glucose, UA NEGATIVE  NEGATIVE (mg/dL)   Hgb urine dipstick SMALL (*) NEGATIVE    Bilirubin Urine NEGATIVE  NEGATIVE    Ketones, ur NEGATIVE  NEGATIVE (mg/dL)   Protein, ur NEGATIVE  NEGATIVE (mg/dL)   Urobilinogen, UA 0.2  0.0 - 1.0 (mg/dL)   Nitrite NEGATIVE  NEGATIVE    Leukocytes, UA NEGATIVE  NEGATIVE   POCT PREGNANCY, URINE      Component Value Range   Preg Test, Ur NEGATIVE    URINE MICROSCOPIC-ADD ON      Component Value Range   RBC / HPF 0-2  <3 (RBC/hpf)       MDM     9:16 AM patient is sitting on the stretcher, NAD.  Vitals are stable, non-toxic appearing.  Abd is soft, NT.  No peritoneal signs.  No CVA tenderness or McBurney's pt tenderness.  I have reviewed patient's previous ED chart and labs.  Sx's are likely related to GERD.   10:47 AM patient resting, sx's have resolved.  Patient is feeling better.  I will d/c her zantac and start a PPI and short course of reglan for the nausea.  I have advised her to f/u with GI if the sx's persist and also given her  recommendations for diet modifications.  She verbalized understanding and agrees to the care plan.    Pt feels improved after observation and/or treatment in ED.    OUTPATIENT MEDICATIONS PRESCRIBED FROM THE ED:   New Prescriptions   METOCLOPRAMIDE (REGLAN) 10 MG TABLET    Take 1 tablet (10 mg total) by mouth every 6 (six) hours. Prn nausea   PANTOPRAZOLE (PROTONIX) 40 MG TABLET    Take 1 tablet (40 mg total) by mouth daily.        Yoseline Andersson L. Brayson Livesey, [place] 07/12/11 1056

## 2011-07-12 NOTE — ED Notes (Signed)
Pt c/o nausea w/o vomiting, left side flank pain, and acid reflux. Pt states she was seen in ed earlier this week and treated with no relief.

## 2011-08-22 ENCOUNTER — Other Ambulatory Visit: Payer: Self-pay

## 2011-08-22 ENCOUNTER — Emergency Department (HOSPITAL_COMMUNITY)
Admission: EM | Admit: 2011-08-22 | Discharge: 2011-08-22 | Disposition: A | Discharge status: Home / Self Care | Payer: Medicaid Other | Attending: Emergency Medicine | Admitting: Emergency Medicine

## 2011-08-22 ENCOUNTER — Encounter (HOSPITAL_COMMUNITY): Payer: Self-pay | Admitting: Emergency Medicine

## 2011-08-22 DIAGNOSIS — R05 Cough: Secondary | ICD-10-CM | POA: Insufficient documentation

## 2011-08-22 DIAGNOSIS — R079 Chest pain, unspecified: Secondary | ICD-10-CM | POA: Insufficient documentation

## 2011-08-22 DIAGNOSIS — R55 Syncope and collapse: Secondary | ICD-10-CM | POA: Insufficient documentation

## 2011-08-22 DIAGNOSIS — R059 Cough, unspecified: Secondary | ICD-10-CM | POA: Insufficient documentation

## 2011-08-22 LAB — URINALYSIS, ROUTINE W REFLEX MICROSCOPIC
Bilirubin Urine: NEGATIVE
Glucose, UA: NEGATIVE mg/dL
Hgb urine dipstick: NEGATIVE
Ketones, ur: NEGATIVE mg/dL
Nitrite: NEGATIVE
Protein, ur: NEGATIVE mg/dL
Specific Gravity, Urine: 1.01 (ref 1.005–1.030)
Urobilinogen, UA: 0.2 mg/dL (ref 0.0–1.0)
pH: 6 (ref 5.0–8.0)

## 2011-08-22 LAB — CBC
HCT: 40.5 % (ref 36.0–46.0)
Hemoglobin: 13.8 g/dL (ref 12.0–15.0)
MCH: 31.8 pg (ref 26.0–34.0)
MCHC: 34.1 g/dL (ref 30.0–36.0)
MCV: 93.3 fL (ref 78.0–100.0)
Platelets: 343 10*3/uL (ref 150–400)
RBC: 4.34 MIL/uL (ref 3.87–5.11)
RDW: 12.9 % (ref 11.5–15.5)
WBC: 5.9 10*3/uL (ref 4.0–10.5)

## 2011-08-22 LAB — DIFFERENTIAL
Basophils Absolute: 0 10*3/uL (ref 0.0–0.1)
Basophils Relative: 0 % (ref 0–1)
Eosinophils Absolute: 0 10*3/uL (ref 0.0–0.7)
Eosinophils Relative: 0 % (ref 0–5)
Lymphocytes Relative: 22 % (ref 12–46)
Lymphs Abs: 1.3 10*3/uL (ref 0.7–4.0)
Monocytes Absolute: 0.2 10*3/uL (ref 0.1–1.0)
Monocytes Relative: 4 % (ref 3–12)
Neutro Abs: 4.3 10*3/uL (ref 1.7–7.7)
Neutrophils Relative %: 74 % (ref 43–77)

## 2011-08-22 LAB — BASIC METABOLIC PANEL
BUN: 11 mg/dL (ref 6–23)
CO2: 24 mEq/L (ref 19–32)
Calcium: 9.6 mg/dL (ref 8.4–10.5)
Chloride: 107 mEq/L (ref 96–112)
Creatinine, Ser: 0.67 mg/dL (ref 0.50–1.10)
GFR calc Af Amer: >90 mL/min (ref >90)
GFR calc non Af Amer: >90 mL/min (ref >90)
Glucose, Bld: 84 mg/dL (ref 70–99)
Potassium: 3.7 mEq/L (ref 3.5–5.1)
Sodium: 141 mEq/L (ref 135–145)

## 2011-08-22 LAB — PREGNANCY, URINE: Preg Test, Ur: NEGATIVE

## 2011-08-22 LAB — URINE MICROSCOPIC-ADD ON

## 2011-08-22 LAB — POCT PREGNANCY, URINE: Preg Test, Ur: NEGATIVE

## 2011-08-22 NOTE — ED Provider Notes (Addendum)
History    Scribed for Donnetta Hutching, MD, the patient was seen in room APA04/APA04. This chart was scribed by Katha Cabal.   CSN: [id number] Arrival date & time: 08/22/2011 10:39 AM   First MD Initiated Contact with Patient 08/22/11 1105      Chief Complaint  Patient presents with  . Chest Pain  . Near Syncope    (Consider location/radiation/quality/duration/timing/severity/associated sxs/prior treatment) Patient is a 30 y.o. female presenting with chest pain. The history is provided by the patient. No language interpreter was used.  Chest Pain The chest pain began less than 1 hour ago. Chest pain occurs intermittently. The pain is associated with breathing. The severity of the pain is moderate. The quality of the pain is described as pleuritic. Chest pain is worsened by deep breathing. Primary symptoms include cough. Primary symptoms comment: near syncope   Associated symptoms include near-syncope.  Pertinent negatives for associated symptoms include no lower extremity edema.    Patient was walking around in [place] and felt like she was going to pass out and sudden onset of upper chest pain. Patient reports similar sx previously while at work.  Patient was baseline this AM when she woke up.  Patient has no hisotry of chronic medical conditions.  Pateint using Depo Provera.    History reviewed. No pertinent past medical history.  Past Surgical History  Procedure Date  . Dental surgery     History reviewed. No pertinent family history.  History  Substance Use Topics  . Smoking status: Never Smoker   . Smokeless tobacco: Not on file  . Alcohol Use: No    OB History    Grav Para Term Preterm Abortions TAB SAB Ect Mult Living                  Review of Systems  Respiratory: Positive for cough.   Cardiovascular: Positive for chest pain and near-syncope.  All other systems reviewed and are negative.    Allergies  Review of patient's allergies indicates no known  allergies.  Home Medications   Current Outpatient Rx  Name Route Sig Dispense Refill  . ACETAMINOPHEN 500 MG PO TABS Oral Take 1,000 mg by mouth every 6 (six) hours as needed. For pain     . MEDROXYPROGESTERONE ACETATE 150 MG/ML IM SUSP Intramuscular Inject 150 mg into the muscle every 3 (three) months. Next dose is due on 09/14/11    . MULTIVITAMINS PO TABS Oral Take 1 tablet by mouth daily.     Marland Kitchen PANTOPRAZOLE SODIUM 40 MG PO TBEC Oral Take 1 tablet (40 mg total) by mouth daily. 20 tablet 0    BP 130/74  Pulse 110  Temp(Src) 98.7 F (37.1 C) (Oral)  Resp 18  Ht 5\' 5"  (1.651 m)  Wt 110 lb (49.896 kg)  BMI 18.31 kg/m2  SpO2 100%  Physical Exam  Constitutional: She is oriented to person, place, and time. She appears well-developed and well-nourished.  Non-toxic appearance. She does not have a sickly appearance.  HENT:  Head: Normocephalic and atraumatic.  Eyes: Conjunctivae, EOM and lids are normal. Pupils are equal, round, and reactive to light. No scleral icterus.  Neck: Trachea normal and normal range of motion. Neck supple.  Cardiovascular: Normal rate, regular rhythm and normal heart sounds.   Pulmonary/Chest: Effort normal and breath sounds normal.  Abdominal: Soft. Normal appearance. There is no tenderness. There is no rebound, no guarding and no CVA tenderness.  Musculoskeletal: Normal range of motion. She exhibits no  edema.  Neurological: She is alert and oriented to person, place, and time. She has normal strength.  Skin: Skin is warm, dry and intact. No rash noted.  Psychiatric: She has a normal mood and affect. Her behavior is normal.    ED Course  Procedures (including critical care time)   DIAGNOSTIC STUDIES: Oxygen Saturation is 100% on room air, normal by my interpretation.    EKG:  Date: 08/22/2011  Rate:96  Rhythm: normal sinus rhythm  QRS Axis: normal  Intervals: normal  ST/T Wave abnormalities: normal  Conduction Disutrbances:none  Narrative  Interpretation:  Old EKG Reviewed: none available   COORDINATION OF CARE: 11:33 AM  Physical exam complete.  Will order labs.    1:54 PM  Discussed lab results with patient. Plan to discharge patient.  Patient agrees with plan.     LABS / RADIOLOGY:    Labs Reviewed  URINALYSIS, ROUTINE W REFLEX MICROSCOPIC - Abnormal; Notable for the following:    Leukocytes, UA SMALL (*)    All other components within normal limits  URINE MICROSCOPIC-ADD ON - Abnormal; Notable for the following:    Squamous Epithelial / LPF FEW (*)    Bacteria, UA FEW (*)    All other components within normal limits  CBC  DIFFERENTIAL  BASIC METABOLIC PANEL  PREGNANCY, URINE  POCT PREGNANCY, URINE   Results for orders placed during the hospital encounter of 08/22/11  CBC      Component Value Range   WBC 5.9  4.0 - 10.5 (K/uL)   RBC 4.34  3.87 - 5.11 (MIL/uL)   Hemoglobin 13.8  12.0 - 15.0 (g/dL)   HCT 69.6  29.5 - 28.4 (%)   MCV 93.3  78.0 - 100.0 (fL)   MCH 31.8  26.0 - 34.0 (pg)   MCHC 34.1  30.0 - 36.0 (g/dL)   RDW 13.2  44.0 - 10.2 (%)   Platelets 343  150 - 400 (K/uL)  DIFFERENTIAL      Component Value Range   Neutrophils Relative 74  43 - 77 (%)   Neutro Abs 4.3  1.7 - 7.7 (K/uL)   Lymphocytes Relative 22  12 - 46 (%)   Lymphs Abs 1.3  0.7 - 4.0 (K/uL)   Monocytes Relative 4  3 - 12 (%)   Monocytes Absolute 0.2  0.1 - 1.0 (K/uL)   Eosinophils Relative 0  0 - 5 (%)   Eosinophils Absolute 0.0  0.0 - 0.7 (K/uL)   Basophils Relative 0  0 - 1 (%)   Basophils Absolute 0.0  0.0 - 0.1 (K/uL)  BASIC METABOLIC PANEL      Component Value Range   Sodium 141  135 - 145 (mEq/L)   Potassium 3.7  3.5 - 5.1 (mEq/L)   Chloride 107  96 - 112 (mEq/L)   CO2 24  19 - 32 (mEq/L)   Glucose, Bld 84  70 - 99 (mg/dL)   BUN 11  6 - 23 (mg/dL)   Creatinine, Ser 7.25  0.50 - 1.10 (mg/dL)   Calcium 9.6  8.4 - 36.6 (mg/dL)   GFR calc non Af Amer >90  >90 (mL/min)   GFR calc Af Amer >90  >90 (mL/min)  URINALYSIS,  ROUTINE W REFLEX MICROSCOPIC      Component Value Range   Color, Urine YELLOW  YELLOW    APPearance CLEAR  CLEAR    Specific Gravity, Urine 1.010  1.005 - 1.030    pH 6.0  5.0 - 8.0  Glucose, UA NEGATIVE  NEGATIVE (mg/dL)   Hgb urine dipstick NEGATIVE  NEGATIVE    Bilirubin Urine NEGATIVE  NEGATIVE    Ketones, ur NEGATIVE  NEGATIVE (mg/dL)   Protein, ur NEGATIVE  NEGATIVE (mg/dL)   Urobilinogen, UA 0.2  0.0 - 1.0 (mg/dL)   Nitrite NEGATIVE  NEGATIVE    Leukocytes, UA SMALL (*) NEGATIVE   PREGNANCY, URINE      Component Value Range   Preg Test, Ur NEGATIVE    POCT PREGNANCY, URINE      Component Value Range   Preg Test, Ur NEGATIVE    URINE MICROSCOPIC-ADD ON      Component Value Range   Squamous Epithelial / LPF FEW (*) RARE    WBC, UA 3-6  <3 (WBC/hpf)   Bacteria, UA FEW (*) RARE     No results found.    IMPRESSION: No diagnosis found.    MDM  Patient had near syncopal event at [facility].  Normal physical exam. Screening labs and EKG normal.      I personally performed the services described in this documentation, which was scribed in my presence. The recorded information has been reviewed and considered.         Donnetta Hutching, MD 08/22/11 1428  Donnetta Hutching, MD 08/22/11 [phone]

## 2011-08-22 NOTE — ED Notes (Signed)
Per ems pt blood sugar was 88 on there arrival.

## 2011-08-22 NOTE — ED Notes (Signed)
Pt was at [employer] and had an almost syncopal moment. Pt states she has been having chest pain intermittently the last couple of days. Pt c/o upper chest pain that gets worse with a deep breath. Pt also c/o cough.

## 2011-09-26 ENCOUNTER — Emergency Department (HOSPITAL_COMMUNITY): Payer: Medicaid Other

## 2011-09-26 ENCOUNTER — Emergency Department (HOSPITAL_COMMUNITY)
Admission: EM | Admit: 2011-09-26 | Discharge: 2011-09-26 | Disposition: A | Discharge status: Home / Self Care | Payer: Medicaid Other | Attending: Emergency Medicine | Admitting: Emergency Medicine

## 2011-09-26 ENCOUNTER — Encounter (HOSPITAL_COMMUNITY): Payer: Self-pay | Admitting: *Deleted

## 2011-09-26 DIAGNOSIS — R110 Nausea: Secondary | ICD-10-CM | POA: Insufficient documentation

## 2011-09-26 DIAGNOSIS — R197 Diarrhea, unspecified: Secondary | ICD-10-CM | POA: Insufficient documentation

## 2011-09-26 DIAGNOSIS — R630 Anorexia: Secondary | ICD-10-CM | POA: Insufficient documentation

## 2011-09-26 DIAGNOSIS — K219 Gastro-esophageal reflux disease without esophagitis: Secondary | ICD-10-CM | POA: Insufficient documentation

## 2011-09-26 DIAGNOSIS — R10815 Periumbilic abdominal tenderness: Secondary | ICD-10-CM | POA: Insufficient documentation

## 2011-09-26 DIAGNOSIS — R109 Unspecified abdominal pain: Secondary | ICD-10-CM | POA: Insufficient documentation

## 2011-09-26 HISTORY — DX: Gastro-esophageal reflux disease without esophagitis: K21.9

## 2011-09-26 LAB — URINALYSIS, ROUTINE W REFLEX MICROSCOPIC
Bilirubin Urine: NEGATIVE
Glucose, UA: NEGATIVE mg/dL
Ketones, ur: NEGATIVE mg/dL
Leukocytes, UA: NEGATIVE
Nitrite: NEGATIVE
Protein, ur: NEGATIVE mg/dL
Specific Gravity, Urine: 1.015 (ref 1.005–1.030)
Urobilinogen, UA: 0.2 mg/dL (ref 0.0–1.0)
pH: 6.5 (ref 5.0–8.0)

## 2011-09-26 LAB — URINE MICROSCOPIC-ADD ON

## 2011-09-26 LAB — POCT PREGNANCY, URINE: Preg Test, Ur: NEGATIVE

## 2011-09-26 MED ORDER — GI COCKTAIL ~~LOC~~
30.0000 mL | Freq: Once | ORAL | Status: AC
  Administered 2011-09-26: 30 mL via ORAL
  Filled 2011-09-26: qty 30

## 2011-09-26 MED ORDER — ONDANSETRON 4 MG PO TBDP
4.0000 mg | ORAL_TABLET | Freq: Once | ORAL | Status: AC
  Administered 2011-09-26: 4 mg via ORAL
  Filled 2011-09-26: qty 1

## 2011-09-26 MED ORDER — PANTOPRAZOLE SODIUM 40 MG PO TBEC
40.0000 mg | DELAYED_RELEASE_TABLET | Freq: Every day | ORAL | Status: DC
  Administered 2011-09-26: 40 mg via ORAL
  Filled 2011-09-26: qty 1

## 2011-09-26 NOTE — ED Notes (Signed)
Pt c/o left flank pain since yesterday. Pt also c/o nausea. Denies vomiting, diarrhea, fever, or urinary symptoms.

## 2011-09-26 NOTE — ED Provider Notes (Signed)
History     CSN: [id number]  Arrival date & time 09/26/11  4098   First MD Initiated Contact with Patient 09/26/11 [phone]      Chief Complaint  Patient presents with  . Flank Pain    (Consider location/radiation/quality/duration/timing/severity/associated sxs/prior treatment) HPI Comments: No trauma.  No abdominal surgeries.  No vaginal d/c.  No UTI sxs.  Nausea but no vomiting.  One last stool last PM.  Decreased appetite.  Has known GERD.  Patient is a 31 y.o. female presenting with flank pain. The history is provided by the patient. No language interpreter was used.  Flank Pain This is a new problem. The current episode started yesterday. The problem occurs intermittently. The problem has been unchanged. Associated symptoms include abdominal pain, anorexia and nausea. Pertinent negatives include no fever or vomiting. The symptoms are aggravated by eating. Treatments tried: OTC prilosec 20 mg. The treatment provided mild relief.    Past Medical History  Diagnosis Date  . GERD (gastroesophageal reflux disease)     Past Surgical History  Procedure Date  . Dental surgery     History reviewed. No pertinent family history.  History  Substance Use Topics  . Smoking status: Never Smoker   . Smokeless tobacco: Not on file  . Alcohol Use: No    OB History    Grav Para Term Preterm Abortions TAB SAB Ect Mult Living                  Review of Systems  Constitutional: Positive for appetite change. Negative for fever.  Gastrointestinal: Positive for nausea, abdominal pain, diarrhea and anorexia. Negative for vomiting.  Genitourinary: Positive for flank pain. Negative for dysuria, urgency, frequency, hematuria, vaginal bleeding, vaginal discharge, vaginal pain, menstrual problem and pelvic pain.  All other systems reviewed and are negative.    Allergies  Review of patient's allergies indicates no known allergies.  Home Medications   Current Outpatient Rx  Name Route Sig  Dispense Refill  . ACETAMINOPHEN 500 MG PO TABS Oral Take 1,000 mg by mouth every 6 (six) hours as needed. For pain     . MEDROXYPROGESTERONE ACETATE 150 MG/ML IM SUSP Intramuscular Inject 150 mg into the muscle every 3 (three) months. Next dose is due on 09/14/11    . MULTIVITAMINS PO TABS Oral Take 1 tablet by mouth daily.     Marland Kitchen PANTOPRAZOLE SODIUM 40 MG PO TBEC Oral Take 1 tablet (40 mg total) by mouth daily. 20 tablet 0    BP 130/74  Pulse 125  Temp(Src) 98.6 F (37 C) (Oral)  Resp 18  Ht 5\' 5"  (1.651 m)  Wt 110 lb (49.896 kg)  BMI 18.31 kg/m2  SpO2 100%  Physical Exam  Nursing note and vitals reviewed. Constitutional: She is oriented to person, place, and time. She appears well-developed and well-nourished. No distress.  HENT:  Head: Normocephalic and atraumatic.  Eyes: EOM are normal.  Neck: Normal range of motion.  Cardiovascular: Normal rate, regular rhythm and normal heart sounds.   Pulmonary/Chest: Effort normal and breath sounds normal.  Abdominal: Soft. She exhibits no distension and no mass. There is no hepatosplenomegaly. There is tenderness in the periumbilical area. There is no rigidity, no rebound, no guarding, no CVA tenderness, no tenderness at McBurney's point and negative Murphy's sign.    Musculoskeletal: Normal range of motion.  Neurological: She is alert and oriented to person, place, and time.  Skin: Skin is warm and dry.  Psychiatric: She has a  normal mood and affect. Judgment normal.    ED Course  Procedures (including critical care time)   Labs Reviewed  POCT PREGNANCY, URINE  URINALYSIS, ROUTINE W REFLEX MICROSCOPIC  POCT PREGNANCY, URINE  PREGNANCY, URINE   No results found.   No diagnosis found.    MDM  Discussed lab and x-ray finding with the pt.  We  Discussed the possibility of this being an early appendicitis.  The pt is OK with a wait and see approach and will return if she develops worsening pain or  fever.        Worthy Rancher, PA 09/26/11 1150

## 2011-09-27 NOTE — ED Provider Notes (Signed)
Medical screening examination/treatment/procedure(s) were performed by non-physician practitioner and as supervising physician I was immediately available for consultation/collaboration.  Sady Monaco S. Woodard Perrell, MD 09/27/11 2232 

## 2011-10-20 ENCOUNTER — Other Ambulatory Visit (HOSPITAL_COMMUNITY): Payer: Self-pay | Admitting: Family Medicine

## 2011-10-20 DIAGNOSIS — R1013 Epigastric pain: Secondary | ICD-10-CM

## 2011-10-23 ENCOUNTER — Ambulatory Visit (HOSPITAL_COMMUNITY): Payer: Medicaid Other

## 2011-10-23 ENCOUNTER — Ambulatory Visit (HOSPITAL_COMMUNITY)
Admission: RE | Admit: 2011-10-23 | Discharge: 2011-10-23 | Disposition: A | Discharge status: Home / Self Care | Payer: Medicaid Other | Source: Ambulatory Visit | Attending: Family Medicine | Admitting: Family Medicine

## 2011-10-23 DIAGNOSIS — R109 Unspecified abdominal pain: Secondary | ICD-10-CM | POA: Insufficient documentation

## 2011-10-23 DIAGNOSIS — R1013 Epigastric pain: Secondary | ICD-10-CM

## 2011-10-31 ENCOUNTER — Encounter (HOSPITAL_COMMUNITY): Payer: Self-pay

## 2011-10-31 ENCOUNTER — Emergency Department (HOSPITAL_COMMUNITY)
Admission: EM | Admit: 2011-10-31 | Discharge: 2011-11-01 | Disposition: A | Discharge status: Home / Self Care | Payer: Medicaid Other | Attending: Emergency Medicine | Admitting: Emergency Medicine

## 2011-10-31 DIAGNOSIS — R011 Cardiac murmur, unspecified: Secondary | ICD-10-CM | POA: Insufficient documentation

## 2011-10-31 DIAGNOSIS — K219 Gastro-esophageal reflux disease without esophagitis: Secondary | ICD-10-CM

## 2011-10-31 DIAGNOSIS — R109 Unspecified abdominal pain: Secondary | ICD-10-CM | POA: Insufficient documentation

## 2011-10-31 DIAGNOSIS — K529 Noninfective gastroenteritis and colitis, unspecified: Secondary | ICD-10-CM

## 2011-10-31 DIAGNOSIS — K5289 Other specified noninfective gastroenteritis and colitis: Secondary | ICD-10-CM | POA: Insufficient documentation

## 2011-10-31 LAB — BASIC METABOLIC PANEL
BUN: 11 mg/dL (ref 6–23)
CO2: 24 mEq/L (ref 19–32)
Calcium: 9.9 mg/dL (ref 8.4–10.5)
Chloride: 102 mEq/L (ref 96–112)
Creatinine, Ser: 0.73 mg/dL (ref 0.50–1.10)
GFR calc Af Amer: >90 mL/min (ref >90)
GFR calc non Af Amer: >90 mL/min (ref >90)
Glucose, Bld: 79 mg/dL (ref 70–99)
Potassium: 3.3 mEq/L — ABNORMAL LOW (ref 3.5–5.1)
Sodium: 138 mEq/L (ref 135–145)

## 2011-10-31 LAB — URINALYSIS, ROUTINE W REFLEX MICROSCOPIC
Bilirubin Urine: NEGATIVE
Glucose, UA: NEGATIVE mg/dL
Leukocytes, UA: NEGATIVE
Nitrite: NEGATIVE
Protein, ur: NEGATIVE mg/dL
Specific Gravity, Urine: <1.005 — ABNORMAL LOW (ref 1.005–1.030)
Urobilinogen, UA: 0.2 mg/dL (ref 0.0–1.0)
pH: 5 (ref 5.0–8.0)

## 2011-10-31 LAB — DIFFERENTIAL
Basophils Absolute: 0 10*3/uL (ref 0.0–0.1)
Basophils Relative: 0 % (ref 0–1)
Eosinophils Absolute: 0 10*3/uL (ref 0.0–0.7)
Eosinophils Relative: 1 % (ref 0–5)
Lymphocytes Relative: 34 % (ref 12–46)
Lymphs Abs: 1.3 10*3/uL (ref 0.7–4.0)
Monocytes Absolute: 0.4 10*3/uL (ref 0.1–1.0)
Monocytes Relative: 10 % (ref 3–12)
Neutro Abs: 2.1 10*3/uL (ref 1.7–7.7)
Neutrophils Relative %: 55 % (ref 43–77)

## 2011-10-31 LAB — CBC
HCT: 42.6 % (ref 36.0–46.0)
Hemoglobin: 14.6 g/dL (ref 12.0–15.0)
MCH: 31.4 pg (ref 26.0–34.0)
MCHC: 34.3 g/dL (ref 30.0–36.0)
MCV: 91.6 fL (ref 78.0–100.0)
Platelets: 186 10*3/uL (ref 150–400)
RBC: 4.65 MIL/uL (ref 3.87–5.11)
RDW: 12.7 % (ref 11.5–15.5)
WBC: 3.7 10*3/uL — ABNORMAL LOW (ref 4.0–10.5)

## 2011-10-31 LAB — HEPATIC FUNCTION PANEL
ALT: 24 U/L (ref 0–35)
AST: 28 U/L (ref 0–37)
Albumin: 3.8 g/dL (ref 3.5–5.2)
Alkaline Phosphatase: 40 U/L (ref 39–117)
Bilirubin, Direct: 0.1 mg/dL (ref 0.0–0.3)
Indirect Bilirubin: 0.4 mg/dL (ref 0.3–0.9)
Total Bilirubin: 0.5 mg/dL (ref 0.3–1.2)
Total Protein: 7 g/dL (ref 6.0–8.3)

## 2011-10-31 LAB — URINE MICROSCOPIC-ADD ON

## 2011-10-31 LAB — LIPASE, BLOOD: Lipase: 35 U/L (ref 11–59)

## 2011-10-31 LAB — POCT PREGNANCY, URINE: Preg Test, Ur: NEGATIVE

## 2011-10-31 MED ORDER — ONDANSETRON HCL 4 MG/2ML IJ SOLN
4.0000 mg | Freq: Once | INTRAMUSCULAR | Status: AC
  Administered 2011-10-31: 4 mg via INTRAVENOUS
  Filled 2011-10-31: qty 2

## 2011-10-31 MED ORDER — SODIUM CHLORIDE 0.9 % IV BOLUS (SEPSIS)
1000.0000 mL | Freq: Once | INTRAVENOUS | Status: AC
  Administered 2011-10-31: 1000 mL via INTRAVENOUS

## 2011-10-31 MED ORDER — SODIUM CHLORIDE 0.9 % IV SOLN: Freq: Once | INTRAVENOUS | Status: DC

## 2011-10-31 MED ORDER — MORPHINE SULFATE 2 MG/ML IJ SOLN
2.0000 mg | Freq: Once | INTRAMUSCULAR | Status: AC
  Administered 2011-10-31: 2 mg via INTRAVENOUS
  Filled 2011-10-31: qty 1

## 2011-10-31 NOTE — ED Notes (Signed)
edpa in with pt 

## 2011-10-31 NOTE — ED Provider Notes (Signed)
History     CSN: [id number]  Arrival date & time 10/31/11  1952   None     Chief Complaint  Patient presents with  . Emesis  . Diarrhea  . Sore Throat    (Consider location/radiation/quality/duration/timing/severity/associated sxs/prior treatment) HPI Comments: Patient states she has had 2-3 days of nausea, vomiting, and diarrhea. The patient states she is having 2-3 episodes of vomiting and 2-3 episodes of diarrhea per day. She has had chills, she has not measured a temperature at home. Her appetite has significantly decreased. She complains of feeling weak and tired. She has not had any change in medications, diet, she has not been out of the country. No hematemesis.  Patient is a 31 y.o. female presenting with vomiting, diarrhea, and pharyngitis. The history is provided by the patient.  Emesis  Associated symptoms include chills and diarrhea. Pertinent negatives include no abdominal pain, no arthralgias and no cough.  Diarrhea The primary symptoms include nausea, vomiting and diarrhea. Primary symptoms do not include abdominal pain, dysuria or arthralgias.  The illness is also significant for chills. The illness does not include back pain.  Sore Throat Associated symptoms include chills, nausea, a sore throat and vomiting. Pertinent negatives include no abdominal pain, arthralgias, chest pain, coughing or neck pain.    Past Medical History  Diagnosis Date  . GERD (gastroesophageal reflux disease)     Past Surgical History  Procedure Date  . Dental surgery     No family history on file.  History  Substance Use Topics  . Smoking status: Never Smoker   . Smokeless tobacco: Not on file  . Alcohol Use: No    OB History    Grav Para Term Preterm Abortions TAB SAB Ect Mult Living                  Review of Systems  Constitutional: Positive for chills. Negative for activity change.       All ROS Neg except as noted in HPI  HENT: Positive for sore throat. Negative  for nosebleeds and neck pain.   Eyes: Negative for photophobia and discharge.  Respiratory: Negative for cough, shortness of breath and wheezing.   Cardiovascular: Negative for chest pain and palpitations.  Gastrointestinal: Positive for nausea, vomiting and diarrhea. Negative for abdominal pain and blood in stool.  Genitourinary: Negative for dysuria, frequency and hematuria.  Musculoskeletal: Negative for back pain and arthralgias.  Skin: Negative.   Neurological: Negative for dizziness, seizures and speech difficulty.  Psychiatric/Behavioral: Negative for hallucinations and confusion.    Allergies  Review of patient's allergies indicates no known allergies.  Home Medications   Current Outpatient Rx  Name Route Sig Dispense Refill  . ACETAMINOPHEN 500 MG PO TABS Oral Take 1,000 mg by mouth every 6 (six) hours as needed. For pain     . CALCIUM CARBONATE ANTACID 500 MG PO CHEW Oral Chew 1 tablet by mouth as needed.    . ADULT MULTIVITAMIN W/MINERALS CH Oral Take 1 tablet by mouth daily.    Marland Kitchen MEDROXYPROGESTERONE ACETATE 150 MG/ML IM SUSP Intramuscular Inject 150 mg into the muscle every 3 (three) months. Next dose is due on 09/14/11      BP 121/80  Pulse 97  Temp(Src) 98.8 F (37.1 C) (Oral)  Resp 20  Ht 5\' 5"  (1.651 m)  Wt 110 lb (49.896 kg)  BMI 18.31 kg/m2  SpO2 100%  Physical Exam  Nursing note and vitals reviewed. Constitutional: She is oriented to person,  place, and time. She appears well-developed and well-nourished.  Non-toxic appearance.  HENT:  Head: Normocephalic.  Right Ear: Tympanic membrane and external ear normal.  Left Ear: Tympanic membrane and external ear normal.  Eyes: EOM and lids are normal. Pupils are equal, round, and reactive to light.  Neck: Normal range of motion. Neck supple. Carotid bruit is not present.  Cardiovascular: Normal rate, regular rhythm, intact distal pulses and normal pulses.   Murmur heard. Pulmonary/Chest: Breath sounds normal.  No respiratory distress.  Abdominal: Soft. Bowel sounds are normal. There is no tenderness. There is no guarding.  Musculoskeletal: Normal range of motion.  Lymphadenopathy:       Head (right side): No submandibular adenopathy present.       Head (left side): No submandibular adenopathy present.    She has no cervical adenopathy.  Neurological: She is alert and oriented to person, place, and time. She has normal strength. No cranial nerve deficit or sensory deficit.  Skin: Skin is warm and dry.  Psychiatric: She has a normal mood and affect. Her speech is normal.    ED Course  Procedures (including critical care time)   Labs Reviewed  URINALYSIS, ROUTINE W REFLEX MICROSCOPIC  CBC  DIFFERENTIAL  BASIC METABOLIC PANEL  HEPATIC FUNCTION PANEL  LIPASE, BLOOD   No results found.   Dx: 1. Gastroenteritis 2. GERD   MDM  I have reviewed nursing notes, vital signs, and all appropriate lab and imaging results for this patient. Patient presents to the emergency department with 2-3 days of nausea, vomiting, diarrhea, and sore throat. She is unsure of temperature elevations, but has been having problems with chills. She denies any hematemesis or hematochezia. No nausea or vomiting after IV fluids and anti-emetics. No diarrhea reported while being in the emergency department. Patient continues to have sensation of having to" burp" she states that this has been going on for the last 3-4 days accompanied by" sore throat" there's been no shortness of breath, jaw pain, or loss of consciousness associated with this pressure and burp sensation. Suspect that this is related to esophageal reflux. The plan at this time is to treat the patient with Pepcid twice a day, Nexium daily, prescription for Zofran every 6 hours given for nausea. Patient is patient is an invited to return to the emergency department if any changes, problems, or concerns. I will Results for orders placed during the hospital encounter  of 10/31/11  URINALYSIS, ROUTINE W REFLEX MICROSCOPIC      Component Value Range   Color, Urine YELLOW  YELLOW    APPearance CLEAR  CLEAR    Specific Gravity, Urine <1.005 (*) 1.005 - 1.030    pH 5.0  5.0 - 8.0    Glucose, UA NEGATIVE  NEGATIVE (mg/dL)   Hgb urine dipstick SMALL (*) NEGATIVE    Bilirubin Urine NEGATIVE  NEGATIVE    Ketones, ur TRACE (*) NEGATIVE (mg/dL)   Protein, ur NEGATIVE  NEGATIVE (mg/dL)   Urobilinogen, UA 0.2  0.0 - 1.0 (mg/dL)   Nitrite NEGATIVE  NEGATIVE    Leukocytes, UA NEGATIVE  NEGATIVE   CBC      Component Value Range   WBC 3.7 (*) 4.0 - 10.5 (K/uL)   RBC 4.65  3.87 - 5.11 (MIL/uL)   Hemoglobin 14.6  12.0 - 15.0 (g/dL)   HCT 16.1  09.6 - 04.5 (%)   MCV 91.6  78.0 - 100.0 (fL)   MCH 31.4  26.0 - 34.0 (pg)   MCHC  34.3  30.0 - 36.0 (g/dL)   RDW 64.3  32.9 - 51.8 (%)   Platelets 186  150 - 400 (K/uL)  DIFFERENTIAL      Component Value Range   Neutrophils Relative 55  43 - 77 (%)   Neutro Abs 2.1  1.7 - 7.7 (K/uL)   Lymphocytes Relative 34  12 - 46 (%)   Lymphs Abs 1.3  0.7 - 4.0 (K/uL)   Monocytes Relative 10  3 - 12 (%)   Monocytes Absolute 0.4  0.1 - 1.0 (K/uL)   Eosinophils Relative 1  0 - 5 (%)   Eosinophils Absolute 0.0  0.0 - 0.7 (K/uL)   Basophils Relative 0  0 - 1 (%)   Basophils Absolute 0.0  0.0 - 0.1 (K/uL)  BASIC METABOLIC PANEL      Component Value Range   Sodium 138  135 - 145 (mEq/L)   Potassium 3.3 (*) 3.5 - 5.1 (mEq/L)   Chloride 102  96 - 112 (mEq/L)   CO2 24  19 - 32 (mEq/L)   Glucose, Bld 79  70 - 99 (mg/dL)   BUN 11  6 - 23 (mg/dL)   Creatinine, Ser 8.41  0.50 - 1.10 (mg/dL)   Calcium 9.9  8.4 - 66.0 (mg/dL)   GFR calc non Af Amer >90  >90 (mL/min)   GFR calc Af Amer >90  >90 (mL/min)  HEPATIC FUNCTION PANEL      Component Value Range   Total Protein 7.0  6.0 - 8.3 (g/dL)   Albumin 3.8  3.5 - 5.2 (g/dL)   AST 28  0 - 37 (U/L)   ALT 24  0 - 35 (U/L)   Alkaline Phosphatase 40  39 - 117 (U/L)   Total Bilirubin 0.5   0.3 - 1.2 (mg/dL)   Bilirubin, Direct 0.1  0.0 - 0.3 (mg/dL)   Indirect Bilirubin 0.4  0.3 - 0.9 (mg/dL)  LIPASE, BLOOD      Component Value Range   Lipase 35  11 - 59 (U/L)  POCT PREGNANCY, URINE      Component Value Range   Preg Test, Ur NEGATIVE  NEGATIVE   URINE MICROSCOPIC-ADD ON      Component Value Range   Squamous Epithelial / LPF FEW (*) RARE    WBC, UA 3-6  <3 (WBC/hpf)   RBC / HPF 3-6  <3 (RBC/hpf)   Bacteria, UA RARE  RARE    US Abdomen Complete  10/23/2011  *RADIOLOGY REPORT*  Clinical Data:  Abdominal pain  COMPLETE ABDOMINAL ULTRASOUND  Comparison:  CT scan 04/12/2010  Findings:  Gallbladder:  No gallstones, gallbladder wall thickening, or pericholecystic fluid. No sonographic Murphy's sign.  Common bile duct:  Measures 2.4 mm in diameter within normal limits.  Liver:  No focal lesion identified.  Within normal limits in parenchymal echogenicity.  IVC:  Appears normal.  Pancreas:  No focal abnormality seen.  Spleen:  Measures 5.5 cm in length.  Normal echogenicity.  Right Kidney:  Measures 10.9 cm in length.  No mass, hydronephrosis or diagnostic renal calculus  Left Kidney:  Measures 9.6 cm in length.  No mass, hydronephrosis or diagnostic renal calculus  Abdominal aorta:  No aneurysm identified. Measures up to 1.8 cm in diameter.  IMPRESSION: Negative abdominal ultrasound.  Original Report Authenticated By: Natasha Mead, M.D.         Kathie Dike, [place] 11/01/11 [phone]

## 2011-10-31 NOTE — ED Notes (Signed)
Pt presents with n/v/d and sore throat since Wednesday.

## 2011-11-01 MED ORDER — FAMOTIDINE 20 MG PO TABS: 20.0000 mg | ORAL_TABLET | Freq: Two times a day (BID) | ORAL | Status: DC

## 2011-11-01 MED ORDER — ONDANSETRON HCL 4 MG PO TABS: 4.0000 mg | ORAL_TABLET | Freq: Four times a day (QID) | ORAL | Status: AC

## 2011-11-01 MED ORDER — FAMOTIDINE 20 MG PO TABS
20.0000 mg | ORAL_TABLET | Freq: Once | ORAL | Status: AC
  Administered 2011-11-01: 20 mg via ORAL
  Filled 2011-11-01: qty 1

## 2011-11-01 MED ORDER — POTASSIUM CHLORIDE CRYS ER 20 MEQ PO TBCR
40.0000 meq | EXTENDED_RELEASE_TABLET | Freq: Once | ORAL | Status: AC
  Administered 2011-11-01: 40 meq via ORAL
  Filled 2011-11-01: qty 2

## 2011-11-01 MED ORDER — ESOMEPRAZOLE MAGNESIUM 40 MG PO CPDR: 40.0000 mg | DELAYED_RELEASE_CAPSULE | Freq: Every day | ORAL | Status: DC

## 2011-11-01 MED ORDER — GI COCKTAIL ~~LOC~~
30.0000 mL | Freq: Once | ORAL | Status: AC
  Administered 2011-11-01: 30 mL via ORAL
  Filled 2011-11-01: qty 30

## 2011-11-01 NOTE — Discharge Instructions (Signed)
You tested negative for acute changes tonight. Please use Pepcid 20 mg 2 times daily, and Nexium 40 mg daily for the heartburn and belching sensation. Please use Zofran 4 mg every 6 hours as needed for nausea. Please return if any changes, problems, or concerns.Gastroesophageal Reflux Disease, Adult Gastroesophageal reflux disease (GERD) happens when acid from your stomach goes into your food pipe (esophagus). The acid can cause a burning feeling in your chest. Over time, the acid can make small holes (ulcers) in your food pipe.  HOME CARE  Ask your doctor for advice about:   Losing weight.   Quitting smoking.   Alcohol use.   Avoid foods and drinks that make your problems worse. You may want to avoid:   Caffeine and alcohol.   Chocolate.   Mints.   Garlic and onions.   Spicy foods.   Citrus fruits, such as oranges, lemons, or limes.   Foods that contain tomato, such as sauce, chili, salsa, and pizza.   Fried and fatty foods.   Avoid lying down for 3 hours before you go to bed or before you take a nap.   Eat small meals often, instead of large meals.   Wear loose-fitting clothing. Do not wear anything tight around your waist.   Raise (elevate) the head of your bed 6 to 8 inches with wood blocks. Using extra pillows does not help.   Only take medicines as told by your doctor.   Do not take aspirin or ibuprofen.  GET HELP RIGHT AWAY IF:   You have pain in your arms, neck, jaw, teeth, or back.   Your pain gets worse or changes.   You feel sick to your stomach (nauseous), throw up (vomit), or sweat (diaphoresis).   You feel short of breath, or you pass out (faint).   Your throw up is green, yellow, black, or looks like coffee grounds or blood.   Your poop (stool) is red, bloody, or black.  MAKE SURE YOU:   Understand these instructions.   Will watch your condition.   Will get help right away if you are not doing well or get worse.  Document Released:  02/18/2008 Document Revised: 05/14/2011 Document Reviewed: 03/21/2011 [facility] Patient Information 2012 [place], [place].Viral Gastroenteritis Viral gastroenteritis is also known as stomach flu. This condition affects the stomach and intestinal tract. The illness typically lasts 3 to 8 days. Most people develop an immune response. This eventually gets rid of the virus. While this natural response develops, the virus can make you quite ill.  CAUSES  Diarrhea and vomiting are often caused by a virus. Medicines (antibiotics) that kill germs will not help unless there is also a germ (bacterial) infection. SYMPTOMS  The most common symptom is diarrhea. This can cause severe loss of fluids (dehydration) and body salt (electrolyte) imbalance. TREATMENT  Treatments for this illness are aimed at rehydration. Antidiarrheal medicines are not recommended. They do not decrease diarrhea volume and may be harmful. Usually, home treatment is all that is needed. The most serious cases involve vomiting so severely that you are not able to keep down fluids taken by mouth (orally). In these cases, intravenous (IV) fluids are needed. Vomiting with viral gastroenteritis is common, but it will usually go away with treatment. HOME CARE INSTRUCTIONS  Small amounts of fluids should be taken frequently. Large amounts at one time may not be tolerated. Plain water may be harmful in infants and the elderly. Oral rehydration solutions (ORS) are available at pharmacies and  grocery stores. ORS replace water and important electrolytes in proper proportions. Sports drinks are not as effective as ORS and may be harmful due to sugars worsening diarrhea.  As a general guideline for children, replace any new fluid losses from diarrhea or vomiting with ORS as follows:   If your child weighs 22 pounds or under (10 kg or less), give 60-120 mL (1/4 - 1/2 cup or 2 - 4 ounces) of ORS for each diarrheal stool or vomiting episode.   If your child  weighs more than 22 pounds (more than 10 kgs), give 120-240 mL (1/2 - 1 cup or 4 - 8 ounces) of ORS for each diarrheal stool or vomiting episode.   In a child with vomiting, it may be helpful to give the above ORS replacement in 5 mL (1 teaspoon) amounts every 5 minutes, then increase as tolerated.   While correcting for dehydration, children should eat normally. However, foods high in sugar should be avoided because this may worsen diarrhea. Large amounts of carbonated soft drinks, juice, gelatin desserts, and other highly sugared drinks should be avoided.   After correction of dehydration, other liquids that are appealing to the child may be added. Children should drink small amounts of fluids frequently and fluids should be increased as tolerated.   Adults should eat normally while drinking more fluids than usual. Drink small amounts of fluids frequently and increase as tolerated. Drink enough water and fluids to keep your urine clear or pale yellow. Broths, weak decaffeinated tea, lemon-lime soft drinks (allowed to go flat), and ORS replace fluids and electrolytes.   Avoid:   Carbonated drinks.   Juice.   Extremely hot or cold fluids.   Caffeine drinks.   Fatty, greasy foods.   Alcohol.   Tobacco.   Too much intake of anything at one time.   Gelatin desserts.   Probiotics are active cultures of beneficial bacteria. They may lessen the amount and number of diarrheal stools in adults. Probiotics can be found in yogurt with active cultures and in supplements.   Wash your hands well to avoid spreading bacteria and viruses.   Antidiarrheal medicines are not recommended for infants and children.   Only take over-the-counter or prescription medicines for pain, discomfort, or fever as directed by your caregiver. Do not give aspirin to children.   For adults with dehydration, ask your caregiver if you should continue all prescribed and over-the-counter medicines.   If your  caregiver has given you a follow-up appointment, it is very important to keep that appointment. Not keeping the appointment could result in a lasting (chronic) or permanent injury and disability. If there is any problem keeping the appointment, you must call to reschedule.  SEEK IMMEDIATE MEDICAL CARE IF:   You are unable to keep fluids down.   There is no urine output in 6 to 8 hours or there is only a small amount of very dark urine.   You develop shortness of breath.   There is blood in the vomit (may look like coffee grounds) or stool.   Belly (abdominal) pain develops, increases, or localizes.   There is persistent vomiting or diarrhea.   You have a fever.   Your baby is older than 3 months with a rectal temperature of 102 F (38.9 C) or higher.   Your baby is 67 months old or younger with a rectal temperature of 100.4 F (38 C) or higher.  MAKE SURE YOU:   Understand these instructions.  Will watch your condition.   Will get help right away if you are not doing well or get worse.  Document Released: 09/01/2005 Document Revised: 05/14/2011 Document Reviewed: 01/13/2007 [facility] Patient Information 2012 [place], [place].

## 2011-11-01 NOTE — ED Provider Notes (Signed)
Medical screening examination/treatment/procedure(s) were performed by non-physician practitioner and as supervising physician I was immediately available for consultation/collaboration.   Joya Gaskins, MD 11/01/11 [phone]

## 2011-11-04 ENCOUNTER — Encounter (INDEPENDENT_AMBULATORY_CARE_PROVIDER_SITE_OTHER): Payer: Self-pay | Admitting: *Deleted

## 2011-11-10 ENCOUNTER — Ambulatory Visit (INDEPENDENT_AMBULATORY_CARE_PROVIDER_SITE_OTHER): Payer: Medicaid Other | Admitting: Internal Medicine

## 2011-11-13 ENCOUNTER — Ambulatory Visit (INDEPENDENT_AMBULATORY_CARE_PROVIDER_SITE_OTHER): Payer: Medicaid Other | Admitting: Internal Medicine

## 2011-11-13 ENCOUNTER — Encounter (INDEPENDENT_AMBULATORY_CARE_PROVIDER_SITE_OTHER): Payer: Self-pay | Admitting: Internal Medicine

## 2011-11-13 VITALS — BP 112/68 | HR 92 | Temp 98.6°F | Ht 65.0 in | Wt 107.0 lb

## 2011-11-13 DIAGNOSIS — K219 Gastro-esophageal reflux disease without esophagitis: Secondary | ICD-10-CM

## 2011-11-13 DIAGNOSIS — R1013 Epigastric pain: Secondary | ICD-10-CM

## 2011-11-13 DIAGNOSIS — R109 Unspecified abdominal pain: Secondary | ICD-10-CM | POA: Insufficient documentation

## 2011-11-13 NOTE — Patient Instructions (Signed)
Avoid spicy foods. OV 3 months. PR in 2 weeks.  If not better will discuss with Dr. Karilyn Cota

## 2011-11-13 NOTE — Progress Notes (Signed)
Subjective:     Patient ID: Paula Riley, female   DOB: [birth year], 31 y.o.   MRN: [id number]  HPI  Referred by Terie Purser, PAC. She is c/o of a lot of reflux problems.  She has a lot of burping. She has epigastric burning. She is having acid reflux every day. The Pepcid helps with her acid reflux.  She still has the burning with the Pepcid. Appetite is good.  Hot, spicy foods bother her.  No unintentional weight loss. She has a BM every day or every other day. No melena or bright red rectal bleeding. She rates her pain at a 5/10.  10/16/2011 H. Pylori negative. H and H 13.8 and 40.5 10/23/2011 US abdomen:Negative  Review of Systems see hpi Current Outpatient Prescriptions  Medication Sig Dispense Refill  . acetaminophen (TYLENOL) 500 MG tablet Take 1,000 mg by mouth every 6 (six) hours as needed. For pain       . calcium carbonate (TUMS - DOSED IN MG ELEMENTAL CALCIUM) 500 MG chewable tablet Chew 1 tablet by mouth as needed.      . famotidine (PEPCID) 20 MG tablet Take 1 tablet (20 mg total) by mouth 2 (two) times daily.  30 tablet  0  . medroxyPROGESTERone (DEPO-PROVERA) 150 MG/ML injection Inject 150 mg into the muscle every 3 (three) months. Next dose is due on 09/14/11      . Multiple Vitamin (MULITIVITAMIN WITH MINERALS) TABS Take 1 tablet by mouth daily.       Past Medical History  Diagnosis Date  . GERD (gastroesophageal reflux disease)   . Heart murmur    Past Surgical History  Procedure Date  . Dental surgery    History   Social History  . Marital Status: Single    Spouse Name: N/A    Number of Children: N/A  . Years of Education: N/A   Occupational History  . Not on file.   Social History Main Topics  . Smoking status: Never Smoker   . Smokeless tobacco: Not on file  . Alcohol Use: No  . Drug Use: No  . Sexually Active: Yes    Birth Control/ Protection: Injection   Other Topics Concern  . Not on file   Social History Narrative  . No narrative on file    Family Status  Relation Status Death Age  . Mother Alive     good health  . Father Alive     good health  . Sister Alive     good health  . Brother Alive     good health   No Known Allergies     Objective:   Physical Exam  Filed Vitals:   11/13/11 0957  Height: 5\' 5"  (1.651 m)  Weight: 107 lb (48.535 kg)   Alert and oriented. Skin warm and dry. Oral mucosa is moist.   . Sclera anicteric, conjunctivae is pink. Thyroid not enlarged. No cervical lymphadenopathy. Lungs clear. Heart regular rate and rhythm.  Abdomen is soft. Bowel sounds are positive. No hepatomegaly. No abdominal masses felt. No tenderness.  No edema to lower extremities. Patient is alert and oriented.      Assessment:    GERD, Some relief with Pepcid    Plan:   Protonix 40mg  po daily.  OV 3 months.  PR  In 2 weeks

## 2011-11-22 ENCOUNTER — Encounter (HOSPITAL_COMMUNITY): Payer: Self-pay

## 2011-11-22 ENCOUNTER — Emergency Department (HOSPITAL_COMMUNITY)
Admission: EM | Admit: 2011-11-22 | Discharge: 2011-11-22 | Disposition: A | Discharge status: Home Health | Payer: Medicaid Other | Attending: Emergency Medicine | Admitting: Emergency Medicine

## 2011-11-22 DIAGNOSIS — R112 Nausea with vomiting, unspecified: Secondary | ICD-10-CM | POA: Insufficient documentation

## 2011-11-22 DIAGNOSIS — K219 Gastro-esophageal reflux disease without esophagitis: Secondary | ICD-10-CM | POA: Insufficient documentation

## 2011-11-22 DIAGNOSIS — Z79899 Other long term (current) drug therapy: Secondary | ICD-10-CM | POA: Insufficient documentation

## 2011-11-22 DIAGNOSIS — R109 Unspecified abdominal pain: Secondary | ICD-10-CM | POA: Insufficient documentation

## 2011-11-22 LAB — URINALYSIS, ROUTINE W REFLEX MICROSCOPIC
Bilirubin Urine: NEGATIVE
Glucose, UA: NEGATIVE mg/dL
Leukocytes, UA: NEGATIVE
Nitrite: NEGATIVE
Protein, ur: NEGATIVE mg/dL
Specific Gravity, Urine: 1.025 (ref 1.005–1.030)
Urobilinogen, UA: 0.2 mg/dL (ref 0.0–1.0)
pH: 6 (ref 5.0–8.0)

## 2011-11-22 LAB — URINE MICROSCOPIC-ADD ON

## 2011-11-22 LAB — POCT PREGNANCY, URINE: Preg Test, Ur: NEGATIVE

## 2011-11-22 MED ORDER — MORPHINE SULFATE 2 MG/ML IJ SOLN
2.0000 mg | Freq: Once | INTRAMUSCULAR | Status: AC
  Administered 2011-11-22: 2 mg via INTRAVENOUS
  Filled 2011-11-22: qty 1

## 2011-11-22 MED ORDER — SODIUM CHLORIDE 0.9 % IV SOLN
Freq: Once | INTRAVENOUS | Status: AC
  Administered 2011-11-22: 21:00:00 via INTRAVENOUS

## 2011-11-22 MED ORDER — PROMETHAZINE HCL 25 MG PO TABS: 12.5000 mg | ORAL_TABLET | Freq: Four times a day (QID) | ORAL | Status: DC | PRN

## 2011-11-22 MED ORDER — ONDANSETRON HCL 4 MG/2ML IJ SOLN
4.0000 mg | Freq: Once | INTRAMUSCULAR | Status: AC
  Administered 2011-11-22: 4 mg via INTRAVENOUS
  Filled 2011-11-22: qty 2

## 2011-11-22 MED ORDER — SODIUM CHLORIDE 0.9 % IV BOLUS (SEPSIS)
1000.0000 mL | Freq: Once | INTRAVENOUS | Status: AC
  Administered 2011-11-22: 1000 mL via INTRAVENOUS

## 2011-11-22 MED ORDER — PANTOPRAZOLE SODIUM 40 MG IV SOLR
40.0000 mg | Freq: Once | INTRAVENOUS | Status: AC
  Administered 2011-11-22: 40 mg via INTRAVENOUS
  Filled 2011-11-22: qty 40

## 2011-11-22 NOTE — ED Notes (Signed)
Pt presents with n/v that started today. Pt tried taking Zofran but was unable to keep it down. Pt denies pain and all other symptoms.

## 2011-11-22 NOTE — ED Notes (Signed)
Patient reports feeling a little bit better, but that she is still kind of queasy.

## 2011-11-22 NOTE — Discharge Instructions (Signed)
Drink fluids. Bland diet for the next 6-8 hours then progress as you can tolerate. Use the nausea medicine as directed. Use BRAT if you develop diarrhea.   B.R.A.T. Diet Your doctor has recommended the B.R.A.T. diet for you or your child until the condition improves. This is often used to help control diarrhea and vomiting symptoms. If you or your child can tolerate clear liquids, you may have:  Bananas.   Rice.   Applesauce.   Toast (and other simple starches such as crackers, potatoes, noodles).  Be sure to avoid dairy products, meats, and fatty foods until symptoms are better. Fruit juices such as apple, grape, and prune juice can make diarrhea worse. Avoid these. Continue this diet for 2 days or as instructed by your caregiver. Document Released: 09/01/2005 Document Revised: 08/21/2011 Document Reviewed: 02/18/2007 [name] Patient Information 2012 [place], [place].

## 2011-11-22 NOTE — ED Provider Notes (Signed)
History     CSN: [id number]  Arrival date & time 11/22/11  1948   First MD Initiated Contact with Patient 11/22/11 2001      Chief Complaint  Patient presents with  . Emesis    (Consider location/radiation/quality/duration/timing/severity/associated sxs/prior treatment) HPI Paula Riley is a 31 y.o. female with a h/o of GERD, who presents to the Emergency Department complaining of nausea and vomiting that began this morning. It is not associated with diarrhea. She has had mild abdominal pain. Denies fever, chills, chest pain, shortness of breath.  Past Medical History  Diagnosis Date  . GERD (gastroesophageal reflux disease)   . Heart murmur     Past Surgical History  Procedure Date  . Dental surgery     History reviewed. No pertinent family history.  History  Substance Use Topics  . Smoking status: Never Smoker   . Smokeless tobacco: Not on file  . Alcohol Use: No    OB History    Grav Para Term Preterm Abortions TAB SAB Ect Mult Living                  Review of Systems A 10 review of systems reviewed and are negative for acute change except as noted in the HPI. Allergies  Review of patient's allergies indicates no known allergies.  Home Medications   Current Outpatient Rx  Name Route Sig Dispense Refill  . ACETAMINOPHEN 500 MG PO TABS Oral Take 1,000 mg by mouth every 6 (six) hours as needed. For pain     . CALCIUM CARBONATE ANTACID 500 MG PO CHEW Oral Chew 1 tablet by mouth as needed.    Marland Kitchen FAMOTIDINE 20 MG PO TABS Oral Take 1 tablet (20 mg total) by mouth 2 (two) times daily. 30 tablet 0  . MEDROXYPROGESTERONE ACETATE 150 MG/ML IM SUSP Intramuscular Inject 150 mg into the muscle every 3 (three) months. Next dose is due on 09/14/11    . ADULT MULTIVITAMIN W/MINERALS CH Oral Take 1 tablet by mouth daily.      BP 125/69  Pulse 129  Temp(Src) 97.8 F (36.6 C) (Oral)  Resp 18  Ht 5\' 5"  (1.651 m)  Wt 110 lb (49.896 kg)  BMI 18.31 kg/m2  SpO2  98%  Physical Exam Physical examination:  Nursing notes reviewed; Vital signs and O2 SAT reviewed;  Constitutional: Well developed, Well nourished, Well hydrated, In no acute distress; Head:  Normocephalic, atraumatic; Eyes: EOMI, PERRL, No scleral icterus; ENMT: Mouth and pharynx normal, Mucous membranes moist; Neck: Supple, Full range of motion, No lymphadenopathy; Cardiovascular: tachycardic,  murmur,  No rub, or gallop; Respiratory: Breath sounds clear & equal bilaterally, No rales, rhonchi, wheezes, or rub, Normal respiratory effort/excursion; Chest: Nontender, Movement normal; Abdomen: Soft, Nontender, Nondistended, Normal bowel sounds; Genitourinary: No CVA tenderness; Extremities: Pulses normal, No tenderness, No edema, No calf edema or asymmetry.; Neuro: AA&Ox3, Major CN grossly intact.  No gross focal motor or sensory deficits in extremities.; Skin: Color normal, Warm, Dry  ED Course  Procedures (including critical care time)   Labs Reviewed  URINALYSIS, ROUTINE W REFLEX MICROSCOPIC     MDM  Patient with nausea, vomiting illness than began this morning. Given IVF, antiemetic, analgesic, PPI with improvement. Patient able to take PO fluids.  Pt feels improved after observation and/or treatment in ED.Pt stable in ED with no significant deterioration in condition.The patient appears reasonably screened and/or stabilized for discharge and I doubt any other medical condition or other [facility] requiring further  screening, evaluation, or treatment in the ED at this time prior to discharge.  MDM Reviewed: nursing note and vitals Interpretation: labs           Nicoletta Dress. Colon Branch, MD 11/22/11 2146

## 2011-11-26 ENCOUNTER — Ambulatory Visit (INDEPENDENT_AMBULATORY_CARE_PROVIDER_SITE_OTHER): Payer: Medicaid Other | Admitting: Internal Medicine

## 2011-11-26 ENCOUNTER — Encounter (INDEPENDENT_AMBULATORY_CARE_PROVIDER_SITE_OTHER): Payer: Self-pay | Admitting: Internal Medicine

## 2011-11-26 VITALS — BP 98/60 | HR 80 | Temp 98.2°F | Ht 65.0 in | Wt 107.2 lb

## 2011-11-26 DIAGNOSIS — B349 Viral infection, unspecified: Secondary | ICD-10-CM

## 2011-11-26 DIAGNOSIS — B9789 Other viral agents as the cause of diseases classified elsewhere: Secondary | ICD-10-CM

## 2011-11-26 LAB — COMPREHENSIVE METABOLIC PANEL
ALT: 16 U/L (ref 0–35)
AST: 23 U/L (ref 0–37)
Albumin: 4.4 g/dL (ref 3.5–5.2)
Alkaline Phosphatase: 34 U/L — ABNORMAL LOW (ref 39–117)
BUN: 10 mg/dL (ref 6–23)
CO2: 25 mEq/L (ref 19–32)
Calcium: 9.4 mg/dL (ref 8.4–10.5)
Chloride: 106 mEq/L (ref 96–112)
Creat: 0.71 mg/dL (ref 0.50–1.10)
Glucose, Bld: 86 mg/dL (ref 70–99)
Potassium: 4.2 mEq/L (ref 3.5–5.3)
Sodium: 141 mEq/L (ref 135–145)
Total Bilirubin: 0.5 mg/dL (ref 0.3–1.2)
Total Protein: 6.7 g/dL (ref 6.0–8.3)

## 2011-11-26 LAB — CBC WITH DIFFERENTIAL/PLATELET
Basophils Absolute: 0 10*3/uL (ref 0.0–0.1)
Basophils Relative: 1 % (ref 0–1)
Eosinophils Absolute: 0 10*3/uL (ref 0.0–0.7)
Eosinophils Relative: 0 % (ref 0–5)
HCT: 40.9 % (ref 36.0–46.0)
Hemoglobin: 13.5 g/dL (ref 12.0–15.0)
Lymphocytes Relative: 49 % — ABNORMAL HIGH (ref 12–46)
Lymphs Abs: 1.4 10*3/uL (ref 0.7–4.0)
MCH: 30.7 pg (ref 26.0–34.0)
MCHC: 33 g/dL (ref 30.0–36.0)
MCV: 93 fL (ref 78.0–100.0)
Monocytes Absolute: 0.2 10*3/uL (ref 0.1–1.0)
Monocytes Relative: 8 % (ref 3–12)
Neutro Abs: 1.2 10*3/uL — ABNORMAL LOW (ref 1.7–7.7)
Neutrophils Relative %: 42 % — ABNORMAL LOW (ref 43–77)
Platelets: 252 10*3/uL (ref 150–400)
RBC: 4.4 MIL/uL (ref 3.87–5.11)
RDW: 12.8 % (ref 11.5–15.5)
WBC: 2.8 10*3/uL — ABNORMAL LOW (ref 4.0–10.5)

## 2011-11-26 NOTE — Patient Instructions (Signed)
Bland diet. Take Zofran as needed for nausea. CBC and CMet today.

## 2011-11-26 NOTE — Progress Notes (Signed)
Subjective:     Patient ID: Paula Riley, female   DOB: 1981-04-19, 31 y.o.   MRN: [id number]  HPI  Paula Riley is a 31 yr old female presenting today with c/o that she was seen in the ED for nausea and vomiting.  She was seen in the ED and given IV fluids. She was told she had a virus.  She says she still has nausea. Nausea occurs after eating.  No fever. She says she feels better but still not 100%.  Appetite is okay. No weight loss.  She is having a BM one every other day.  No abdominal pain.    Acid reflux is controlled with the Protonix. No urinary symptoms.  Review of Systems see hpi Current Outpatient Prescriptions  Medication Sig Dispense Refill  . acetaminophen (TYLENOL) 500 MG tablet Take 1,000 mg by mouth every 6 (six) hours as needed. For pain       . medroxyPROGESTERone (DEPO-PROVERA) 150 MG/ML injection Inject 150 mg into the muscle every 3 (three) months. Next dose is due on 09/14/11      . Multiple Vitamin (MULITIVITAMIN WITH MINERALS) TABS Take 1 tablet by mouth daily.      . Ondansetron HCl (ZOFRAN PO) Take 1 tablet by mouth as needed. For nausea      . pantoprazole (PROTONIX) 40 MG tablet Take 40 mg by mouth daily.      . promethazine (PHENERGAN) 25 MG tablet Take 0.5 tablets (12.5 mg total) by mouth every 6 (six) hours as needed for nausea.  10 tablet  0   Past Surgical History  Procedure Date  . Dental surgery    Past Medical History  Diagnosis Date  . GERD (gastroesophageal reflux disease)   . Heart murmur    History   Social History  . Marital Status: Single    Spouse Name: N/A    Number of Children: N/A  . Years of Education: N/A   Occupational History  . Not on file.   Social History Main Topics  . Smoking status: Never Smoker   . Smokeless tobacco: Not on file  . Alcohol Use: No  . Drug Use: No  . Sexually Active: Yes    Birth Control/ Protection: Injection   Other Topics Concern  . Not on file   Social History Narrative  . No narrative on file     Family Status  Relation Status Death Age  . Mother Alive     good health  . Father Alive     good health  . Sister Alive     good health  . Brother Alive     good health   No Known Allergies     Objective:   Physical Exam Filed Vitals:   11/26/11 1104  Height: 5\' 5"  (1.651 m)  Weight: 107 lb 3.2 oz (48.626 kg)  Alert and oriented. Skin warm and dry. Oral mucosa is moist.   . Sclera anicteric, conjunctivae is pink. Thyroid not enlarged. No cervical lymphadenopathy. Lungs clear. Heart regular rate and rhythm.  Abdomen is soft. Bowel sounds are positive. No hepatomegaly. No abdominal masses felt. No tenderness.  No edema to lower extremities. Patient is alert and oriented.       Assessment:    Probable viral syndrome which is better now.     Plan:    CBC, cmet.  Further recommendations once we have the labs back.

## 2011-11-27 ENCOUNTER — Ambulatory Visit (INDEPENDENT_AMBULATORY_CARE_PROVIDER_SITE_OTHER): Payer: Medicaid Other | Admitting: Internal Medicine

## 2011-12-02 ENCOUNTER — Encounter: Payer: Self-pay | Admitting: Cardiology

## 2011-12-02 ENCOUNTER — Ambulatory Visit (INDEPENDENT_AMBULATORY_CARE_PROVIDER_SITE_OTHER): Payer: Medicaid Other | Admitting: Cardiology

## 2011-12-02 ENCOUNTER — Telehealth (INDEPENDENT_AMBULATORY_CARE_PROVIDER_SITE_OTHER): Payer: Self-pay | Admitting: Internal Medicine

## 2011-12-02 VITALS — BP 129/87 | HR 93 | Ht 65.0 in | Wt 109.0 lb

## 2011-12-02 DIAGNOSIS — B349 Viral infection, unspecified: Secondary | ICD-10-CM

## 2011-12-02 DIAGNOSIS — K219 Gastro-esophageal reflux disease without esophagitis: Secondary | ICD-10-CM

## 2011-12-02 DIAGNOSIS — R002 Palpitations: Secondary | ICD-10-CM

## 2011-12-02 DIAGNOSIS — N390 Urinary tract infection, site not specified: Secondary | ICD-10-CM

## 2011-12-02 NOTE — Patient Instructions (Signed)
Your physician recommends that you schedule a follow-up appointment in: 6-8 WEEKS  Your physician has recommended that you wear an event monitor. Event monitors are medical devices that record the heart's electrical activity. Doctors most often [place] these monitors to diagnose arrhythmias. Arrhythmias are problems with the speed or rhythm of the heartbeat. The monitor is a small, portable device. You can wear one while you do your normal daily activities. This is usually used to diagnose what is causing palpitations/syncope (passing out).

## 2011-12-02 NOTE — Progress Notes (Signed)
  HPI: 31 year old female previously seen by Dr. [place] for evaluation of palpitations. Patient had an echocardiogram in October of 2011. Her ejection fraction was 65-70% and there was grade 1 diastolic dysfunction. There was no significant Doppler abnormality. Previous monitor in October of 2011 showed no significant arrhythmias. Palpitations previously attributed anxiety. Patient presents with complaints of palpitations described as a skip followed by her heart racing. No associated symptoms. Occurs approximately weekly. Otherwise no dyspnea, chest pain or syncope. Occasional dizziness.  Current Outpatient Prescriptions  Medication Sig Dispense Refill  . acetaminophen (TYLENOL) 500 MG tablet Take 1,000 mg by mouth every 6 (six) hours as needed. For pain       . medroxyPROGESTERone (DEPO-PROVERA) 150 MG/ML injection Inject 150 mg into the muscle every 3 (three) months. Next dose is due on 09/14/11      . Multiple Vitamin (MULITIVITAMIN WITH MINERALS) TABS Take 1 tablet by mouth daily.      . Ondansetron HCl (ZOFRAN PO) Take 1 tablet by mouth as needed. For nausea      . pantoprazole (PROTONIX) 40 MG tablet Take 40 mg by mouth daily.        No Known Allergies  Past Medical History  Diagnosis Date  . GERD (gastroesophageal reflux disease)   . Palpitations     Past Surgical History  Procedure Date  . Dental surgery     History   Social History  . Marital Status: Single    Spouse Name: N/A    Number of Children: N/A  . Years of Education: N/A   Occupational History  . Not on file.   Social History Main Topics  . Smoking status: Never Smoker   . Smokeless tobacco: Not on file  . Alcohol Use: No  . Drug Use: No  . Sexually Active: Yes    Birth Control/ Protection: Injection   Other Topics Concern  . Not on file   Social History Narrative  . No narrative on file    No family history on file.  ROS:  no fevers or chills, productive cough, hemoptysis, dysphasia,  odynophagia, melena, hematochezia, dysuria, hematuria, rash, seizure activity, orthopnea, PND, pedal edema, claudication. Remaining systems are negative.  Physical Exam:   Blood pressure 129/87, pulse 93, height 5\' 5"  (1.651 m), weight 109 lb (49.442 kg).  General:  Well developed/well nourished in NAD Skin warm/dry Patient not depressed No peripheral clubbing Back-normal HEENT-normal/normal eyelids Neck supple/normal carotid upstroke bilaterally; no bruits; no JVD; no thyromegaly chest - CTA/ normal expansion CV - RRR/normal S1 and S2; no rubs or gallops;  PMI nondisplaced, 2/6 systolic murmur left sternal border Abdomen -NT/ND, no HSM, no mass, + bowel sounds, no bruit 2+ femoral pulses, no bruits Ext-no edema, chords, 2+ DP Neuro-grossly nonfocal  ECG normal sinus rhythm at a rate of 93. Axis normal. No significant ST changes. No delta wave noted.

## 2011-12-02 NOTE — Assessment & Plan Note (Signed)
Plan [employer]. Note she did not have symptoms while wearing her previous monitor. May need to consider a beta blocker in the future. Patient also has a systolic murmur on exam. Previous echocardiogram showed vigorous LV function but no significant valvular abnormality.

## 2011-12-02 NOTE — Telephone Encounter (Signed)
Discussed with Dr. Karilyn Cota. Will repeat a CBC and a Urinalysis. She says she feels much better. Really no urinary symptoms.

## 2011-12-02 NOTE — Assessment & Plan Note (Signed)
Management per primary care. 

## 2011-12-03 ENCOUNTER — Telehealth: Payer: Self-pay

## 2011-12-03 LAB — CBC WITH DIFFERENTIAL/PLATELET
Basophils Absolute: 0 10*3/uL (ref 0.0–0.1)
Basophils Relative: 0 % (ref 0–1)
Eosinophils Absolute: 0.1 10*3/uL (ref 0.0–0.7)
Eosinophils Relative: 1 % (ref 0–5)
HCT: 42.3 % (ref 36.0–46.0)
Hemoglobin: 13.9 g/dL (ref 12.0–15.0)
Lymphocytes Relative: 54 % — ABNORMAL HIGH (ref 12–46)
Lymphs Abs: 3 10*3/uL (ref 0.7–4.0)
MCH: 30.5 pg (ref 26.0–34.0)
MCHC: 32.9 g/dL (ref 30.0–36.0)
MCV: 93 fL (ref 78.0–100.0)
Monocytes Absolute: 0.3 10*3/uL (ref 0.1–1.0)
Monocytes Relative: 6 % (ref 3–12)
Neutro Abs: 2.1 10*3/uL (ref 1.7–7.7)
Neutrophils Relative %: 38 % — ABNORMAL LOW (ref 43–77)
Platelets: 224 10*3/uL (ref 150–400)
RBC: 4.55 MIL/uL (ref 3.87–5.11)
RDW: 13.2 % (ref 11.5–15.5)
WBC: 5.4 10*3/uL (ref 4.0–10.5)

## 2011-12-03 LAB — URINALYSIS
Bilirubin Urine: NEGATIVE
Glucose, UA: NEGATIVE mg/dL
Ketones, ur: NEGATIVE mg/dL
Nitrite: NEGATIVE
Protein, ur: NEGATIVE mg/dL
Specific Gravity, Urine: 1.012 (ref 1.005–1.030)
Urobilinogen, UA: 0.2 mg/dL (ref 0.0–1.0)
pH: 7 (ref 5.0–8.0)

## 2011-12-03 NOTE — Telephone Encounter (Signed)
THE MONITOR WILL BE TO THIS PATIENT BY [employer]

## 2012-01-15 ENCOUNTER — Telehealth: Payer: Self-pay | Admitting: *Deleted

## 2012-01-15 NOTE — Telephone Encounter (Signed)
Left message for pt to call, monitor reviewed by dr [name] shows sinus to sinus tach with PAC's.

## 2012-01-16 NOTE — Telephone Encounter (Signed)
Spoke with pt, .aware of results.  

## 2012-01-21 ENCOUNTER — Emergency Department (HOSPITAL_COMMUNITY)
Admission: EM | Admit: 2012-01-21 | Discharge: 2012-01-21 | Disposition: A | Discharge status: Home / Self Care | Payer: Medicaid Other | Attending: Emergency Medicine | Admitting: Emergency Medicine

## 2012-01-21 ENCOUNTER — Encounter (HOSPITAL_COMMUNITY): Payer: Self-pay | Admitting: *Deleted

## 2012-01-21 DIAGNOSIS — K219 Gastro-esophageal reflux disease without esophagitis: Secondary | ICD-10-CM | POA: Insufficient documentation

## 2012-01-21 DIAGNOSIS — K529 Noninfective gastroenteritis and colitis, unspecified: Secondary | ICD-10-CM

## 2012-01-21 DIAGNOSIS — R002 Palpitations: Secondary | ICD-10-CM | POA: Insufficient documentation

## 2012-01-21 DIAGNOSIS — K5289 Other specified noninfective gastroenteritis and colitis: Secondary | ICD-10-CM | POA: Insufficient documentation

## 2012-01-21 DIAGNOSIS — Z79899 Other long term (current) drug therapy: Secondary | ICD-10-CM | POA: Insufficient documentation

## 2012-01-21 LAB — POCT I-STAT, CHEM 8
BUN: 14 mg/dL (ref 6–23)
Calcium, Ion: 1.13 mmol/L (ref 1.12–1.32)
Chloride: 101 mEq/L (ref 96–112)
Creatinine, Ser: 1 mg/dL (ref 0.50–1.10)
Glucose, Bld: 95 mg/dL (ref 70–99)
HCT: 45 % (ref 36.0–46.0)
Hemoglobin: 15.3 g/dL — ABNORMAL HIGH (ref 12.0–15.0)
Potassium: 3.9 mEq/L (ref 3.5–5.1)
Sodium: 139 mEq/L (ref 135–145)
TCO2: 29 mmol/L (ref 0–100)

## 2012-01-21 LAB — URINALYSIS, MICROSCOPIC ONLY
Glucose, UA: NEGATIVE mg/dL
Hgb urine dipstick: NEGATIVE
Ketones, ur: NEGATIVE mg/dL
Leukocytes, UA: NEGATIVE
Nitrite: NEGATIVE
Protein, ur: NEGATIVE mg/dL
Specific Gravity, Urine: 1.02 (ref 1.005–1.030)
Urobilinogen, UA: 0.2 mg/dL (ref 0.0–1.0)
pH: 6 (ref 5.0–8.0)

## 2012-01-21 LAB — POCT PREGNANCY, URINE: Preg Test, Ur: NEGATIVE

## 2012-01-21 MED ORDER — SODIUM CHLORIDE 0.9 % IV BOLUS (SEPSIS): 1000.0000 mL | Freq: Once | INTRAVENOUS | Status: DC

## 2012-01-21 MED ORDER — PROMETHAZINE HCL 25 MG PO TABS: 25.0000 mg | ORAL_TABLET | Freq: Four times a day (QID) | ORAL | Status: DC | PRN

## 2012-01-21 MED ORDER — DIPHENOXYLATE-ATROPINE 2.5-0.025 MG PO TABS
2.0000 | ORAL_TABLET | ORAL | Status: AC
  Administered 2012-01-21: 2 via ORAL
  Filled 2012-01-21: qty 2

## 2012-01-21 MED ORDER — SODIUM CHLORIDE 0.9 % IV BOLUS (SEPSIS)
1000.0000 mL | Freq: Once | INTRAVENOUS | Status: AC
  Administered 2012-01-21: 1000 mL via INTRAVENOUS

## 2012-01-21 MED ORDER — DIPHENOXYLATE-ATROPINE 2.5-0.025 MG PO TABS: 1.0000 | ORAL_TABLET | Freq: Four times a day (QID) | ORAL | Status: AC | PRN

## 2012-01-21 MED ORDER — ONDANSETRON 4 MG PO TBDP: 4.0000 mg | ORAL_TABLET | Freq: Three times a day (TID) | ORAL | Status: AC | PRN

## 2012-01-21 MED ORDER — ONDANSETRON HCL 4 MG/2ML IJ SOLN
4.0000 mg | Freq: Once | INTRAMUSCULAR | Status: AC
  Administered 2012-01-21: 4 mg via INTRAVENOUS
  Filled 2012-01-21: qty 2

## 2012-01-21 MED ORDER — PROMETHAZINE HCL 25 MG/ML IJ SOLN
25.0000 mg | Freq: Once | INTRAMUSCULAR | Status: AC
  Administered 2012-01-21: 25 mg via INTRAVENOUS
  Filled 2012-01-21: qty 1

## 2012-01-21 NOTE — ED Notes (Signed)
Pt reports n/v/d x 3 days.  

## 2012-01-21 NOTE — ED Notes (Signed)
Into room to attempt iv access. 

## 2012-01-21 NOTE — ED Provider Notes (Addendum)
History     CSN: [id number]  Arrival date & time 01/21/12  0116   First MD Initiated Contact with Patient 01/21/12 0151      Chief Complaint  Patient presents with  . Emesis    (Consider location/radiation/quality/duration/timing/severity/associated sxs/prior treatment) HPI Comments: 3 days of nausea vomiting and watery diarrhea. She states she had 6 episodes of watery diarrhea today, 2 episodes of vomiting and has had a poor appetite. She has been trying to drink fluids but says that every time she has anything to eat or drink she has watery diarrhea and nausea. She denies incontinence, travel, recent antibiotic use, has not been hospitalized setting or around daycare aged children. She has tried oral Zofran which she had left over from a prior illness and states that it has not helped at all. She denies chest pain, shortness of breath, cough, back pain, abdominal pain, rashes, swelling, numbness, tingling, dysuria, pregnancy.  Patient is a 31 y.o. female presenting with vomiting. The history is provided by the patient, medical records and a relative.  Emesis  This is a new problem. Episode onset: 3 days ago. The problem occurs continuously. The problem has not changed since onset.The emesis has an appearance of stomach contents. There has been no fever. Associated symptoms include diarrhea ( watery). Pertinent negatives include no abdominal pain, no chills, no fever and no sweats. Risk factors: no sick contacts, no recent abx use.    Past Medical History  Diagnosis Date  . GERD (gastroesophageal reflux disease)   . Palpitations     Past Surgical History  Procedure Date  . Dental surgery     No family history on file.  History  Substance Use Topics  . Smoking status: Never Smoker   . Smokeless tobacco: Not on file  . Alcohol Use: No    OB History    Grav Para Term Preterm Abortions TAB SAB Ect Mult Living                  Review of Systems  Constitutional: Negative  for fever and chills.  Gastrointestinal: Positive for vomiting and diarrhea ( watery). Negative for abdominal pain.  All other systems reviewed and are negative.    Allergies  Review of patient's allergies indicates no known allergies.  Home Medications   Current Outpatient Rx  Name Route Sig Dispense Refill  . ACETAMINOPHEN 500 MG PO TABS Oral Take 1,000 mg by mouth every 6 (six) hours as needed. For pain     . DIPHENOXYLATE-ATROPINE 2.5-0.025 MG PO TABS Oral Take 1 tablet by mouth 4 (four) times daily as needed for diarrhea or loose stools. 30 tablet 0  . MEDROXYPROGESTERONE ACETATE 150 MG/ML IM SUSP Intramuscular Inject 150 mg into the muscle every 3 (three) months. Next dose is due on 09/14/11    . ADULT MULTIVITAMIN W/MINERALS CH Oral Take 1 tablet by mouth daily.    Marland Kitchen ONDANSETRON 4 MG PO TBDP Oral Take 1 tablet (4 mg total) by mouth every 8 (eight) hours as needed for nausea. 10 tablet 0  . ZOFRAN PO Oral Take 1 tablet by mouth as needed. For nausea    . PANTOPRAZOLE SODIUM 40 MG PO TBEC Oral Take 40 mg by mouth daily.    Marland Kitchen PROMETHAZINE HCL 25 MG PO TABS Oral Take 0.5 tablets (12.5 mg total) by mouth every 6 (six) hours as needed for nausea. 10 tablet 0  . PROMETHAZINE HCL 25 MG PO TABS Oral Take 1 tablet (25  mg total) by mouth every 6 (six) hours as needed for nausea. 12 tablet 0    BP 120/80  Pulse 118  Temp(Src) 98.4 F (36.9 C) (Oral)  Resp 18  Ht 5\' 5"  (1.651 m)  Wt 110 lb (49.896 kg)  BMI 18.31 kg/m2  SpO2 99%  Physical Exam  Nursing note and vitals reviewed. Constitutional: She appears well-developed and well-nourished. No distress.  HENT:  Head: Normocephalic and atraumatic.  Mouth/Throat: Oropharynx is clear and moist. No oropharyngeal exudate.       Mucous membranes are moist  Eyes: Conjunctivae and EOM are normal. Pupils are equal, round, and reactive to light. Right eye exhibits no discharge. Left eye exhibits no discharge. No scleral icterus.  Neck: Normal  range of motion. Neck supple. No JVD present. No thyromegaly present.  Cardiovascular: Regular rhythm, normal heart sounds and intact distal pulses.  Exam reveals no gallop and no friction rub.   No murmur heard.      Borderline tachycardia  Pulmonary/Chest: Effort normal and breath sounds normal. No respiratory distress. She has no wheezes. She has no rales.  Abdominal: Soft. Bowel sounds are normal. She exhibits no distension and no mass. There is no tenderness.       Soft and nontender abdomen with normal bowel sounds  Musculoskeletal: Normal range of motion. She exhibits no edema and no tenderness.  Lymphadenopathy:    She has no cervical adenopathy.  Neurological: She is alert. Coordination normal.  Skin: Skin is warm and dry. No rash noted. No erythema.  Psychiatric: She has a normal mood and affect. Her behavior is normal.    ED Course  Procedures (including critical care time)  Labs Reviewed  URINALYSIS, WITH MICROSCOPIC - Abnormal; Notable for the following:    Bilirubin Urine SMALL (*)    All other components within normal limits  POCT I-STAT, CHEM 8 - Abnormal; Notable for the following:    Hemoglobin 15.3 (*)    All other components within normal limits  POCT PREGNANCY, URINE   No results found.   1. Gastroenteritis       MDM  Overall the patient appears to be mildly dehydrated with a mild tachycardia and a history of nausea vomiting diarrhea for 3 days. She otherwise appears well, is afebrile and has a normal blood pressure of 120/80. Will start treatment with Zofran and IV fluids, check potassium level, urinalysis to gauge dehydration and pregnancy.  Well appearing    Given lomotil, phenergan and nausea - IVF and has been ambulatory without difficulty   Discharge Prescriptions include:  zofran Phenergan Lomotil   Vida Roller, MD 01/21/12 [id number]  Vida Roller, MD 01/21/12 [phone]

## 2012-01-21 NOTE — Discharge Instructions (Signed)
See reading instructions - your tests were normal - take the medicines as prescribed including zofran OR phenergan for nausea - lomotil for diarrhea - drink plenty of fluids.  RESOURCE GUIDE  Dental Problems  Patients with Medicaid: [facility] [id number]                                           [phone] [address] Phone:  [phone]                                                  Phone:  [phone]  If unable to pay or uninsured, contact:  [facility] or [facility]. to become qualified for the adult dental clinic.  Chronic Pain Problems Contact [facility]  [phone] Patients need to be referred by their primary care doctor.  Insufficient Money for Medicine Contact [employer]:  call "211" or [facility] [phone].  No Primary Care Doctor Call [facility]  [phone] Other agencies that provide inexpensive medical care    [facility]  [phone]    [facility]  [phone]    [facility]  [phone]    [facility]  [phone]    [facility]  [phone]    [facility]  [phone]  Psychological Services [facility]  [phone] [facility] Services  [phone] [facility]   [phone] (emergency services [phone])  Substance Abuse Resources [facility]  [phone] [facility] [phone] [facility] [phone] Floydene Flock [phone] Residential & Outpatient Substance Abuse Program  [phone]  Abuse/Neglect [facility] Child Abuse Hotline 418-863-0997 [facility] Child Abuse Hotline 719-262-9348 (After Hours)  Emergency Shelter [facility] [phone]  Maternity Homes [facility] [place] [facility] [phone] [facility] [phone]  MRSA Hotline #:   (551)658-7682    [facility]  Resources  [facility]     [employer]                          [facility] Dept. [address] [place]                       [address]      [address]  [place]                                                Cristobal Goldmann Phone:  [phone]  Phone:  [phone]                 Phone:  [phone]  [facility] Phone:  [phone]  [place] Child Abuse Hotline [phone]) 342-1394 [phone] (After Hours)   

## 2012-01-21 NOTE — ED Notes (Signed)
Into room to assess patient. States she has had nausea, vomiting, diarrhea for the past few days. States she hasn't had any abdominal pain. Last food\ fluid intake was at 2100 this evening and had diarrhea after. Bowel sounds active in all fields. No abdominal tenderness. Abdomen soft to touch. Unknown of last menstrual period due to being on Depo shot. Good skin turgor with moist mucous membranes.

## 2012-01-21 NOTE — ED Notes (Addendum)
States nausea isn't any better. Saline locked to walk to bathroom. Steady gait. MD aware.

## 2012-01-21 NOTE — ED Notes (Signed)
Patient ambulatory to bathroom to obtain urine specimen via clean catch. 

## 2012-01-21 NOTE — ED Notes (Signed)
Ambulatory with steady gait to bathroom. States nausea is better.

## 2012-01-21 NOTE — ED Notes (Signed)
Resting comfortably sitting up in bed. Denies needs. No distress. Call bell within reach. Friend at bedside. Will continue to monitor.

## 2012-01-26 ENCOUNTER — Encounter: Payer: Self-pay | Admitting: *Deleted

## 2012-01-26 ENCOUNTER — Encounter (INDEPENDENT_AMBULATORY_CARE_PROVIDER_SITE_OTHER): Payer: Self-pay

## 2012-01-27 ENCOUNTER — Ambulatory Visit: Payer: Medicaid Other | Admitting: Cardiology

## 2012-02-10 ENCOUNTER — Ambulatory Visit (INDEPENDENT_AMBULATORY_CARE_PROVIDER_SITE_OTHER): Payer: Medicaid Other | Admitting: Internal Medicine

## 2012-02-12 ENCOUNTER — Other Ambulatory Visit: Payer: Self-pay | Admitting: Obstetrics & Gynecology

## 2012-02-12 ENCOUNTER — Encounter (INDEPENDENT_AMBULATORY_CARE_PROVIDER_SITE_OTHER): Payer: Self-pay | Admitting: Internal Medicine

## 2012-02-12 ENCOUNTER — Ambulatory Visit (INDEPENDENT_AMBULATORY_CARE_PROVIDER_SITE_OTHER): Payer: Medicaid Other | Admitting: Internal Medicine

## 2012-02-12 ENCOUNTER — Other Ambulatory Visit (HOSPITAL_COMMUNITY)
Admission: RE | Admit: 2012-02-12 | Discharge: 2012-02-12 | Disposition: A | Discharge status: Home / Self Care | Payer: Medicaid Other | Source: Ambulatory Visit | Attending: Obstetrics & Gynecology | Admitting: Obstetrics & Gynecology

## 2012-02-12 VITALS — BP 100/60 | HR 80 | Temp 98.5°F | Ht 65.0 in | Wt 108.0 lb

## 2012-02-12 DIAGNOSIS — Z01419 Encounter for gynecological examination (general) (routine) without abnormal findings: Secondary | ICD-10-CM | POA: Insufficient documentation

## 2012-02-12 DIAGNOSIS — K219 Gastro-esophageal reflux disease without esophagitis: Secondary | ICD-10-CM

## 2012-02-12 NOTE — Patient Instructions (Signed)
Discharge Instructions  Browse by Alphabet  A B C D E F G H I J K L M N O P Q R S T U V W X Y Z   Browse by Category  All Documents  Allergy and Immunology  Anesthesiology  Bariatrics  Bioterrorism  Cardiology  Critical Care  Dentistry  Dermatology  Diabetes  Dietary  Easy-to-Read  Emergency Medicine  Endocrinology  ENT  Family Medicine  Forms  Gastroenterology  Geriatrics  Hematology  Home Health Care  Infectious Disease  Internal Medicine  Labs and Tests  Neonatology  Nephrology  Neurology  Obstetrics and Gynecology  Oncology  Ophthalmology  Orthopedics  Pediatrics  Pharmacology  Physical Medicine and Rehabilitation  Podiatry  Preventive Medicine  Procedures  Psychiatry  Pulmonary Medicine  Radiology  Rheumatology  Surgery  Urology  Drug Information Sheets  All Drug Information Sheets   Browse by Alphabet  A B C D E F G H I J K L M N O P Q R S T U V W X Y Z

## 2012-02-12 NOTE — Progress Notes (Signed)
Subjective:     Patient ID: Paula Riley, female   DOB: Jun 19, 1981, 31 y.o.   MRN: [id number]  HPI  She tells me she is doing good. She is avoiding spicy foods such a tomatoes. She is avoiding citrus juices. Appetite is good. Small amount of weight loss. She has had no nausea or vomiting. No abdominal pain. She has a BM about 1 a day or every other day. No GI problems.  echocardiogram in October of 2011. Her ejection fraction was 65-70% and there was grade 1 diastolic dysfunction. There was no significant Doppler abnormality. Previous monitor in October of 2011 showed no significant arrhythmias.   Review of Systems see hpi Current Outpatient Prescriptions  Medication Sig Dispense Refill  . acetaminophen (TYLENOL) 500 MG tablet Take 1,000 mg by mouth every 6 (six) hours as needed. For pain       . medroxyPROGESTERone (DEPO-PROVERA) 150 MG/ML injection Inject 150 mg into the muscle every 3 (three) months. Next dose is due on 09/14/11      . Multiple Vitamin (MULITIVITAMIN WITH MINERALS) TABS Take 1 tablet by mouth daily.      . Ondansetron HCl (ZOFRAN PO) Take 1 tablet by mouth as needed. For nausea      . pantoprazole (PROTONIX) 40 MG tablet Take 40 mg by mouth daily.      . promethazine (PHENERGAN) 25 MG tablet Take 0.5 tablets (12.5 mg total) by mouth every 6 (six) hours as needed for nausea.  10 tablet  0  . promethazine (PHENERGAN) 25 MG tablet Take 1 tablet (25 mg total) by mouth every 6 (six) hours as needed for nausea.  12 tablet  0  . DISCONTD: famotidine (PEPCID) 20 MG tablet Take 1 tablet (20 mg total) by mouth 2 (two) times daily.  30 tablet  0   Past Medical History  Diagnosis Date  . GERD (gastroesophageal reflux disease)   . Palpitations   . Abdominal  pain, other specified site 11/13/2011   Past Surgical History  Procedure Date  . Dental surgery    Family Status  Relation Status Death Age  . Mother Alive     good health  . Father Alive     good health  . Sister Alive      good health  . Brother Alive     good health   No Known Allergies      Objective:   Physical Exam Filed Vitals:   02/12/12 1552  Height: 5\' 5"  (1.651 m)  Weight: 108 lb (48.988 kg)   Alert and oriented. Skin warm and dry. Oral mucosa is moist.   . Sclera anicteric, conjunctivae is pink. Thyroid not enlarged. No cervical lymphadenopathy. Lungs clear. Heart regular rate and rhythm.  Abdomen is soft. Bowel sounds are positive. No hepatomegaly. No abdominal masses felt. No tenderness.  No edema to lower extremities. Patient is alert and oriented.  CBC    Component Value Date/Time   WBC 5.4 12/02/2011 1436   RBC 4.55 12/02/2011 1436   HGB 15.3* 01/21/2012 0219   HCT 45.0 01/21/2012 0219   PLT 224 12/02/2011 1436   MCV 93.0 12/02/2011 1436   MCH 30.5 12/02/2011 1436   MCHC 32.9 12/02/2011 1436   RDW 13.2 12/02/2011 1436   LYMPHSABS 3.0 12/02/2011 1436   MONOABS 0.3 12/02/2011 1436   EOSABS 0.1 12/02/2011 1436   BASOSABS 0.0 12/02/2011 1436    CMP     Component Value Date/Time   NA 139 01/21/2012  0219   K 3.9 01/21/2012 0219   CL 101 01/21/2012 0219   CO2 25 11/26/2011 1113   GLUCOSE 95 01/21/2012 0219   BUN 14 01/21/2012 0219   CREATININE 1.00 01/21/2012 0219   CREATININE 0.71 11/26/2011 1113   CALCIUM 9.4 11/26/2011 1113   PROT 6.7 11/26/2011 1113   ALBUMIN 4.4 11/26/2011 1113   AST 23 11/26/2011 1113   ALT 16 11/26/2011 1113   ALKPHOS 34* 11/26/2011 1113   BILITOT 0.5 11/26/2011 1113   GFRNONAA >90 10/31/2011 2230   GFRAA >90 10/31/2011 2230    Urinalysis    Component Value Date/Time   COLORURINE YELLOW 01/21/2012 0205   APPEARANCEUR CLEAR 01/21/2012 0205   LABSPEC 1.020 01/21/2012 0205   PHURINE 6.0 01/21/2012 0205   GLUCOSEU NEGATIVE 01/21/2012 0205   HGBUR NEGATIVE 01/21/2012 0205   BILIRUBINUR SMALL* 01/21/2012 0205   KETONESUR NEGATIVE 01/21/2012 0205   PROTEINUR NEGATIVE 01/21/2012 0205   UROBILINOGEN 0.2 01/21/2012 0205   NITRITE NEGATIVE 01/21/2012 0205   LEUKOCYTESUR NEGATIVE 01/21/2012 0205          Assessment:  GERD.controlled now at this time with diet.    Plan:    Continue GERD diet. If any problems, call our office.  Patient is asymptomatic at t his time.-

## 2012-03-07 IMAGING — CR DG ABDOMEN ACUTE W/ 1V CHEST
3 series · 3 of 3 positions shown · non-contrast
Comparison: 06/18/2011

CLINICAL DATA: Left flank pain.

ACUTE ABDOMEN SERIES (ABDOMEN 2 VIEW & CHEST 1 VIEW)

[view not recorded (1 of 3)]
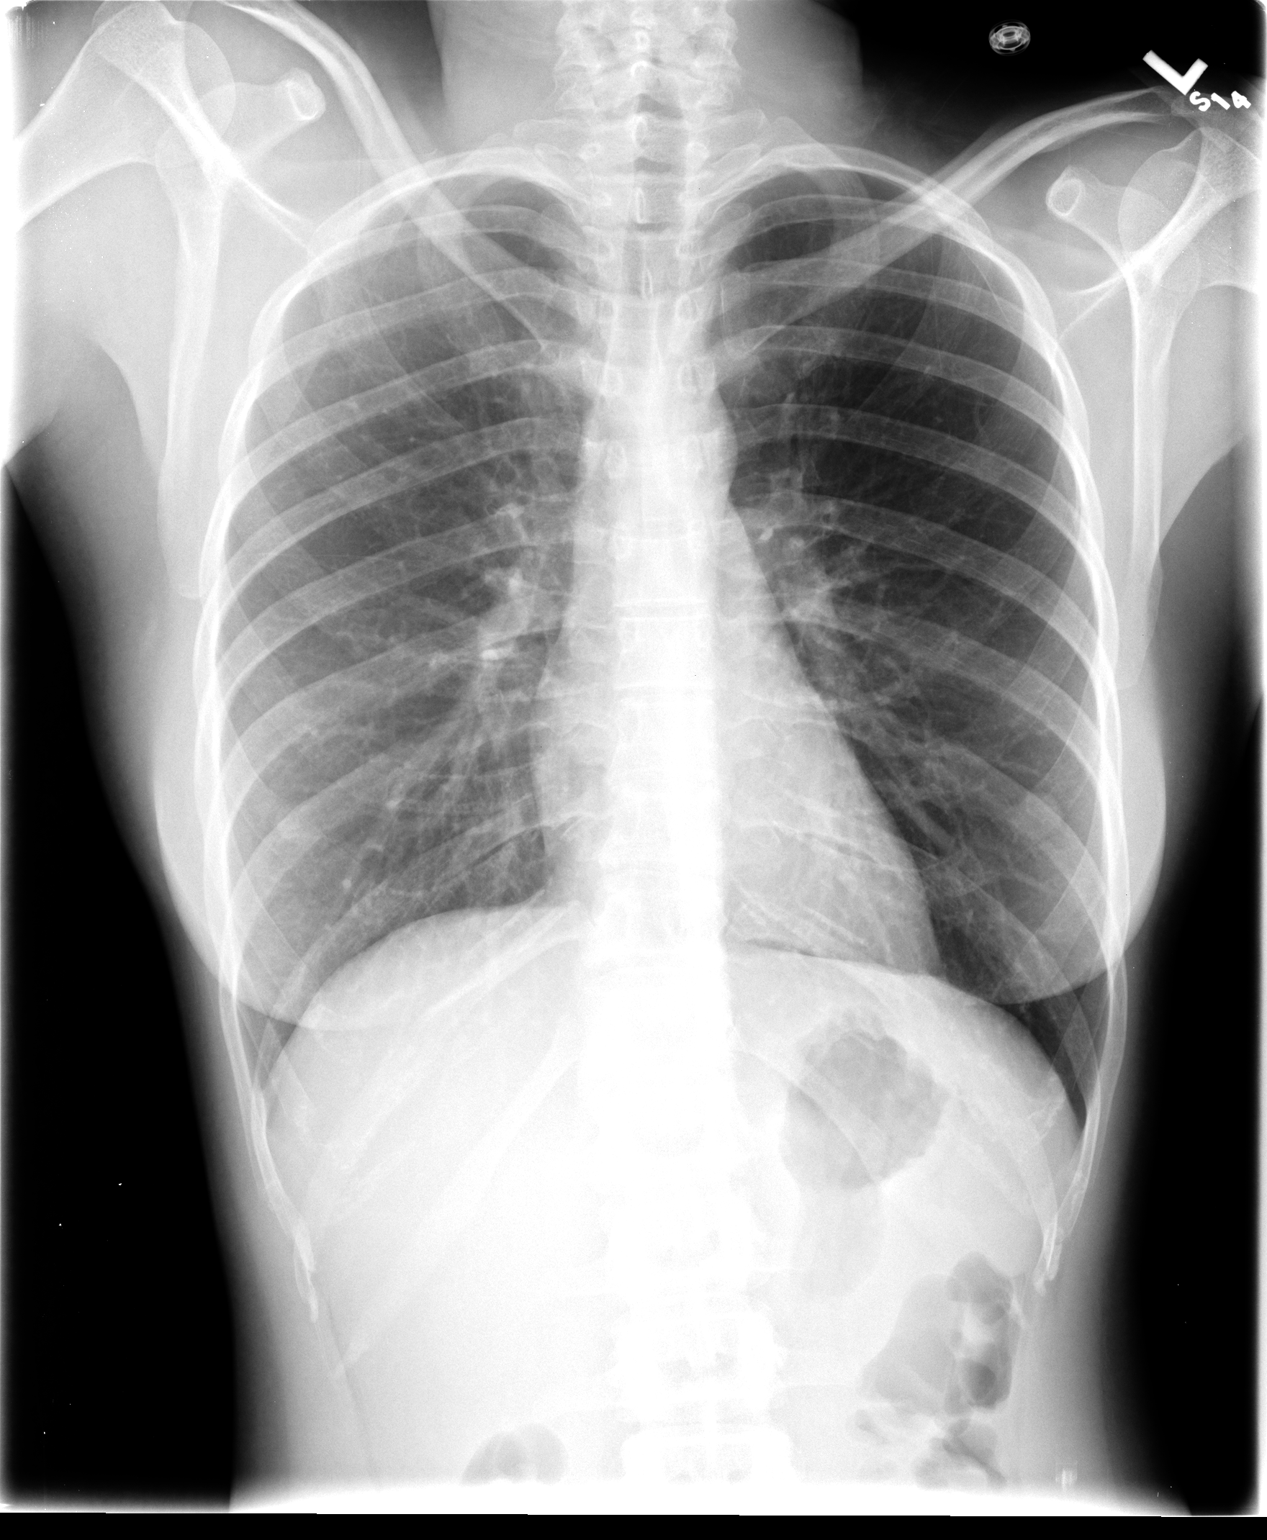

[view not recorded (2 of 3)]
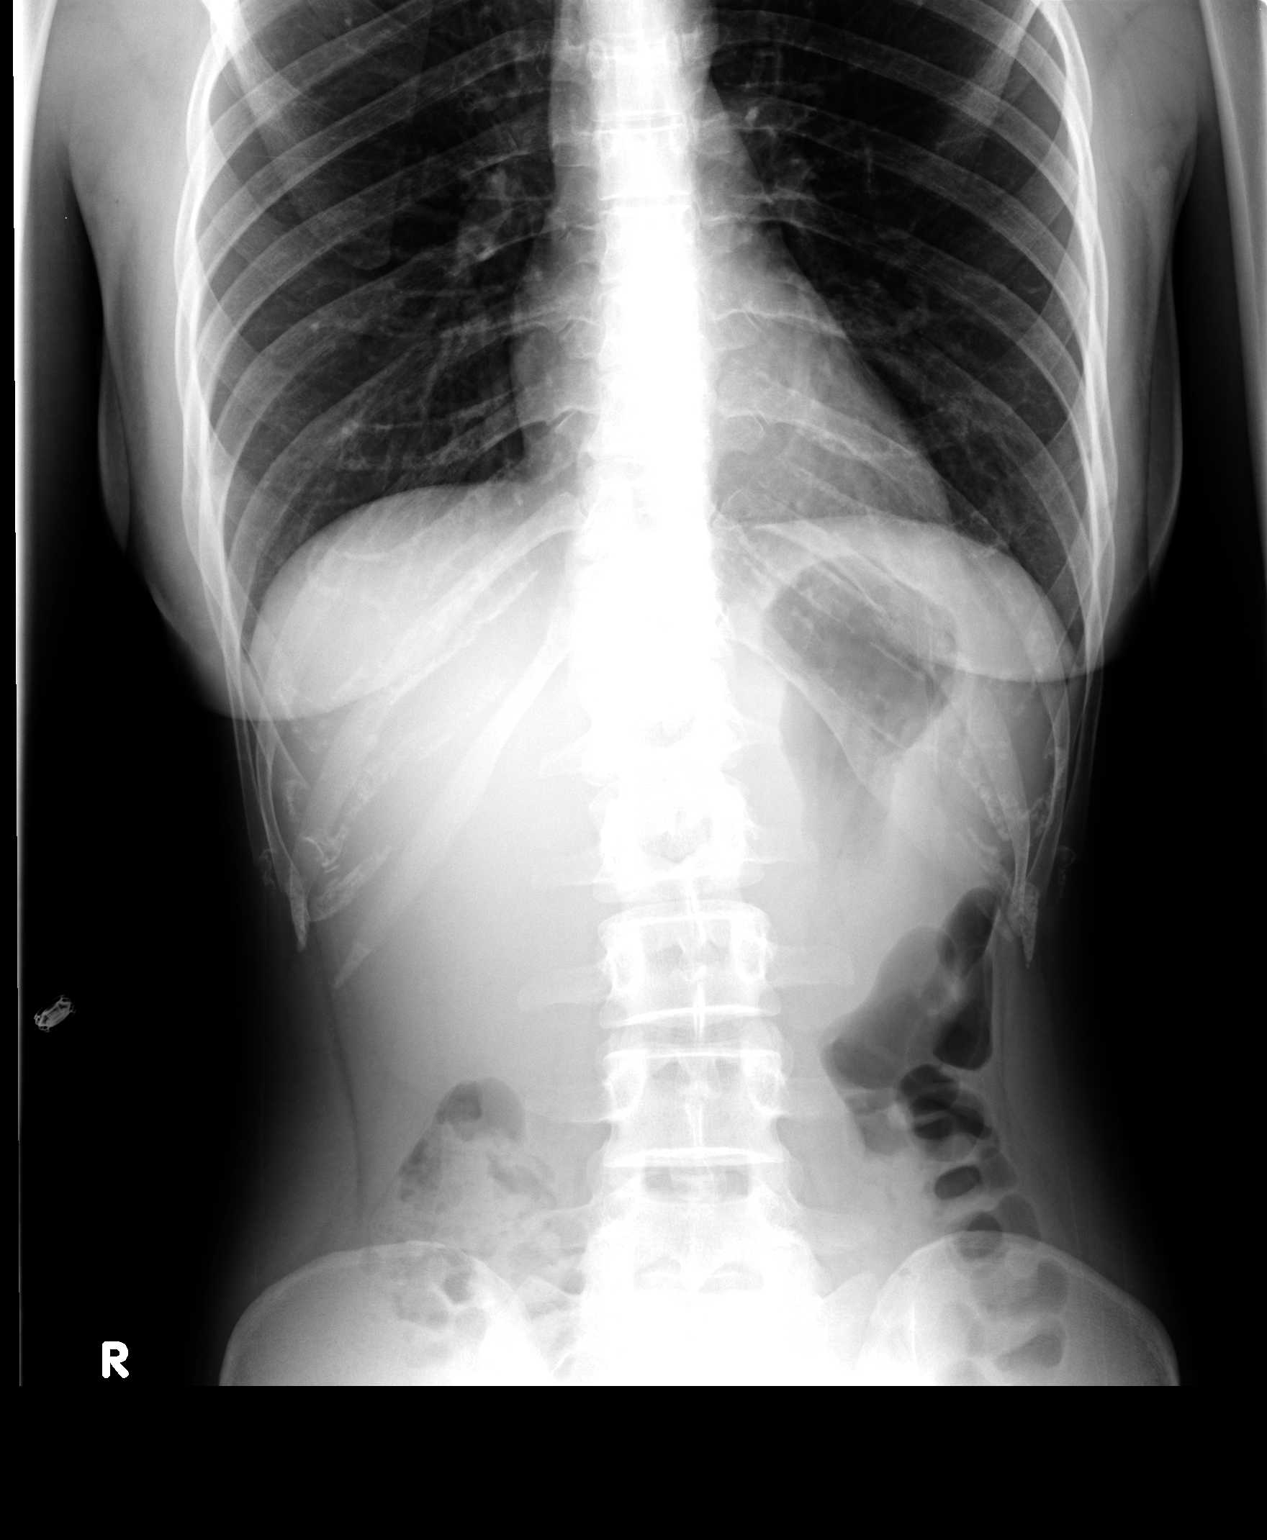

[view not recorded (3 of 3)]
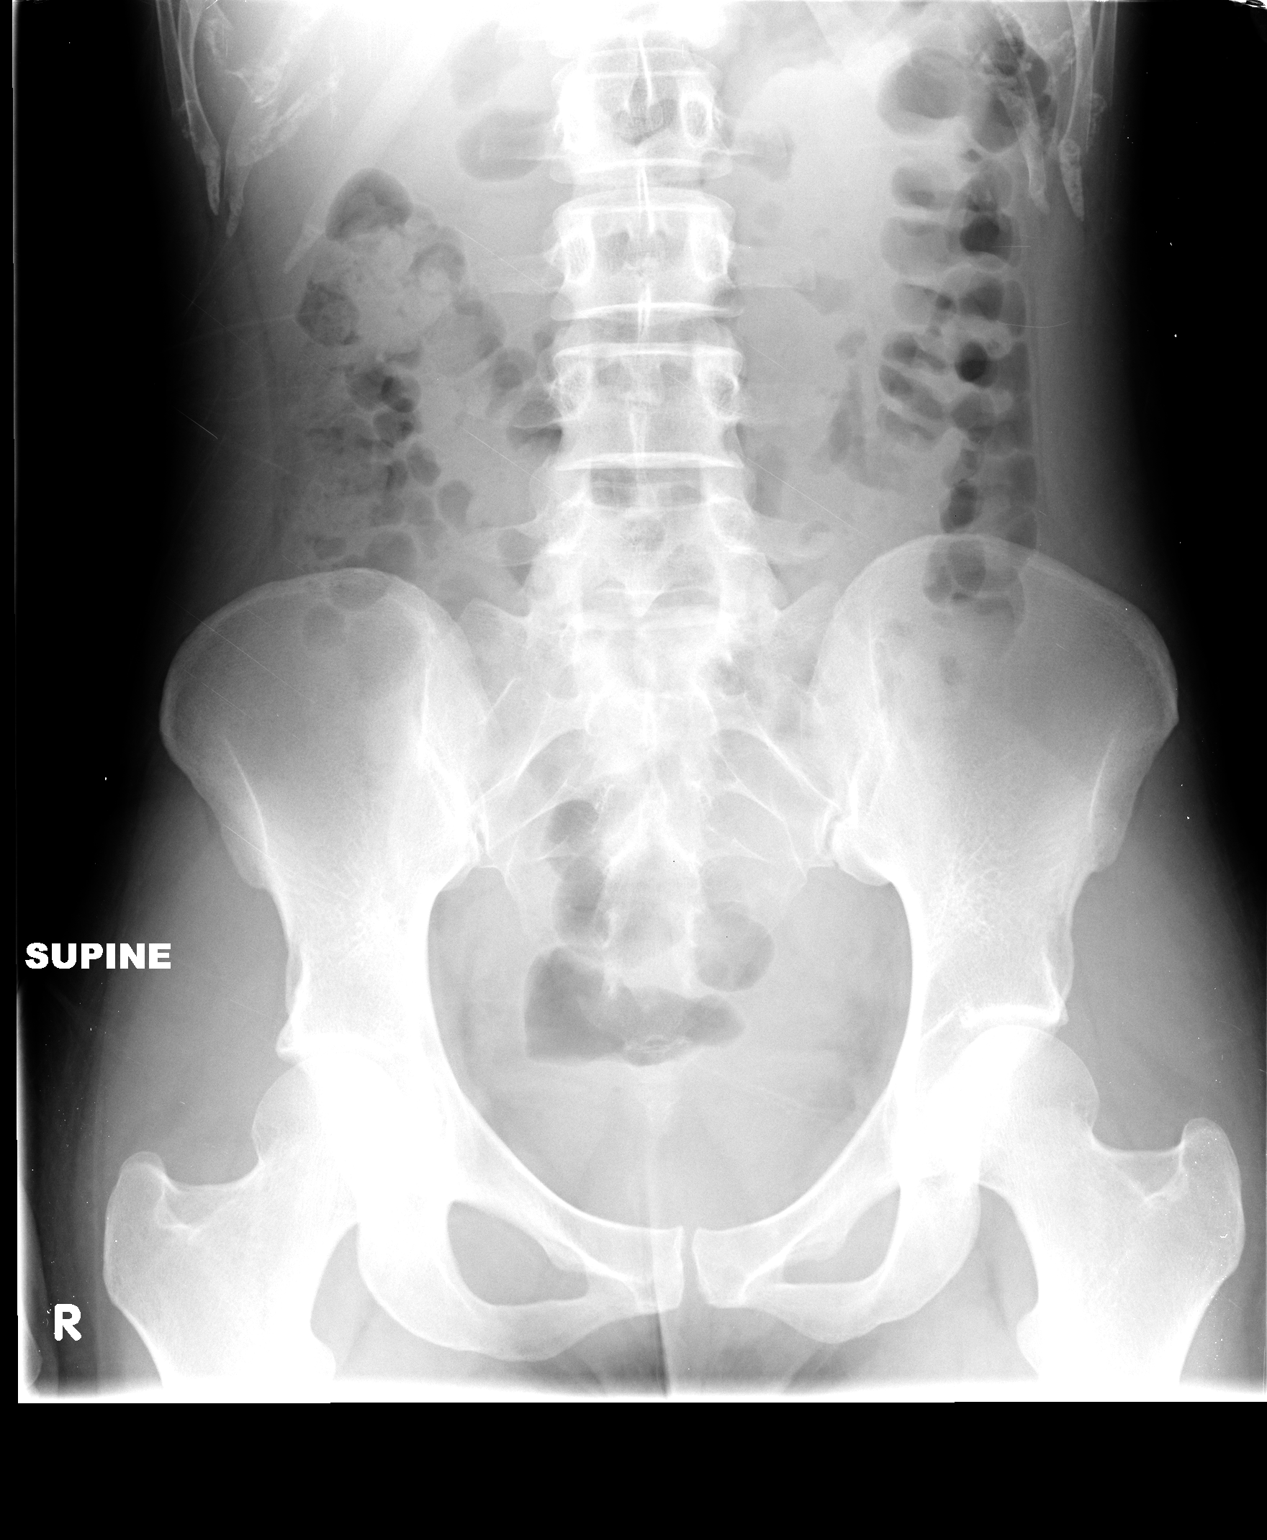

[3 of 3 positions shown; findings below may reference images not displayed]

FINDINGS: The bowel gas pattern is normal.  There is no evidence of
free intraperitoneal air.  No suspicious radio-opaque calculi or
other significant radiographic abnormality is seen. Heart size and
mediastinal contours are within normal limits.  Both lungs are
clear.
IMPRESSION: Normal study.

## 2012-03-11 ENCOUNTER — Encounter: Payer: Self-pay | Admitting: Cardiology

## 2012-03-11 ENCOUNTER — Ambulatory Visit (INDEPENDENT_AMBULATORY_CARE_PROVIDER_SITE_OTHER): Payer: Medicaid Other | Admitting: Cardiology

## 2012-03-11 VITALS — BP 126/80 | HR 111 | Ht 65.0 in | Wt 115.0 lb

## 2012-03-11 DIAGNOSIS — R002 Palpitations: Secondary | ICD-10-CM

## 2012-03-11 NOTE — Progress Notes (Signed)
   HPI: Pleasant female for fu of palpitations. Patient had an echocardiogram in October of 2011. Her ejection fraction was 65-70% and there was grade 1 diastolic dysfunction. There was no significant Doppler abnormality. Previous monitor in October of 2011 showed no significant arrhythmias. When I initially saw her in March of 2013 we scheduled a CardioNet. This revealed sinus to sinus tachycardia with PACs. Since I last saw her, she continues to have an occasional skip but no sustained palpitations. No dyspnea, chest pain or syncope.   Current Outpatient Prescriptions  Medication Sig Dispense Refill  . acetaminophen (TYLENOL) 500 MG tablet Take 1,000 mg by mouth every 6 (six) hours as needed. For pain       . medroxyPROGESTERone (DEPO-PROVERA) 150 MG/ML injection Inject 150 mg into the muscle every 3 (three) months. Next dose is due on 09/14/11      . Multiple Vitamin (MULITIVITAMIN WITH MINERALS) TABS Take 1 tablet by mouth daily.      . Ondansetron HCl (ZOFRAN PO) Take 1 tablet by mouth as needed. For nausea      . pantoprazole (PROTONIX) 40 MG tablet Take 40 mg by mouth daily.      Marland Kitchen DISCONTD: famotidine (PEPCID) 20 MG tablet Take 1 tablet (20 mg total) by mouth 2 (two) times daily.  30 tablet  0     Past Medical History  Diagnosis Date  . GERD (gastroesophageal reflux disease)   . Palpitations     Past Surgical History  Procedure Date  . Dental surgery     History   Social History  . Marital Status: Single    Spouse Name: N/A    Number of Children: N/A  . Years of Education: N/A   Occupational History  . Not on file.   Social History Main Topics  . Smoking status: Never Smoker   . Smokeless tobacco: Not on file  . Alcohol Use: No  . Drug Use: No  . Sexually Active: Yes    Birth Control/ Protection: Injection   Other Topics Concern  . Not on file   Social History Narrative  . No narrative on file    ROS: no fevers or chills, productive cough, hemoptysis,  dysphasia, odynophagia, melena, hematochezia, dysuria, hematuria, rash, seizure activity, orthopnea, PND, pedal edema, claudication. Remaining systems are negative.  Physical Exam: Well-developed well-nourished in no acute distress.  Skin is warm and dry.  HEENT is normal.  Neck is supple.  Chest is clear to auscultation with normal expansion.  Cardiovascular exam is regular rate and rhythm. 2/6 systolic murmur left sternal border. Abdominal exam nontender or distended. No masses palpated. Extremities show no edema. neuro grossly intact  ECG sinus tachycardia at a rate of 111. Left atrial enlargement. No ST changes.

## 2012-03-11 NOTE — Patient Instructions (Addendum)
Your physician recommends that you schedule a follow-up appointment in: as needed  

## 2012-03-11 NOTE — Assessment & Plan Note (Signed)
Monitor shows sinus to sinus tachycardia with PACs. Her LV function is normal. I discussed beta-blockade today but she has not symptomatic enough in her opinion to begin a medication. We will consider this in the future if her symptoms worsen.

## 2012-06-26 ENCOUNTER — Emergency Department (HOSPITAL_COMMUNITY)
Admission: EM | Admit: 2012-06-26 | Discharge: 2012-06-26 | Disposition: A | Discharge status: Home / Self Care | Payer: Medicaid Other | Attending: Emergency Medicine | Admitting: Emergency Medicine

## 2012-06-26 ENCOUNTER — Encounter (HOSPITAL_COMMUNITY): Payer: Self-pay | Admitting: *Deleted

## 2012-06-26 DIAGNOSIS — H6690 Otitis media, unspecified, unspecified ear: Secondary | ICD-10-CM | POA: Insufficient documentation

## 2012-06-26 DIAGNOSIS — H6692 Otitis media, unspecified, left ear: Secondary | ICD-10-CM

## 2012-06-26 DIAGNOSIS — R42 Dizziness and giddiness: Secondary | ICD-10-CM

## 2012-06-26 MED ORDER — AMOXICILLIN 500 MG PO CAPS: 500.0000 mg | ORAL_CAPSULE | Freq: Three times a day (TID) | ORAL | Status: DC

## 2012-06-26 MED ORDER — MECLIZINE HCL 12.5 MG PO TABS
25.0000 mg | ORAL_TABLET | Freq: Once | ORAL | Status: AC
  Administered 2012-06-26: 25 mg via ORAL
  Filled 2012-06-26: qty 2

## 2012-06-26 MED ORDER — AMOXICILLIN 250 MG PO CAPS
500.0000 mg | ORAL_CAPSULE | Freq: Once | ORAL | Status: AC
  Administered 2012-06-26: 500 mg via ORAL
  Filled 2012-06-26: qty 2

## 2012-06-26 MED ORDER — MECLIZINE HCL 25 MG PO TABS: 25.0000 mg | ORAL_TABLET | Freq: Four times a day (QID) | ORAL | Status: DC | PRN

## 2012-06-26 NOTE — ED Notes (Signed)
Pt c/o bilateral ear pressure and dizziness

## 2012-06-26 NOTE — ED Provider Notes (Signed)
History   This chart was scribed for Donnetta Hutching, MD scribed by Magnus Sinning. The patient was seen in room APA19/APA19 at 16:50   CSN: [id number]  Arrival date & time 06/26/12  1619   Chief Complaint  Patient presents with  . Otalgia    both ears  . Dizziness    (Consider location/radiation/quality/duration/timing/severity/associated sxs/prior treatment) The history is provided by the patient. No language interpreter was used.   Paula Riley is a 31 y.o. female who presents to the Emergency Department complaining of constant moderate bilateral ear pressure, worse on the left,onset one week with associated tinnitus, mild neck stiffness and dizziness.  Past Medical History  Diagnosis Date  . GERD (gastroesophageal reflux disease)   . Palpitations     Past Surgical History  Procedure Date  . Dental surgery     History reviewed. No pertinent family history.  History  Substance Use Topics  . Smoking status: Never Smoker   . Smokeless tobacco: Not on file  . Alcohol Use: No   Review of Systems  All other systems reviewed and are negative.   10 Systems reviewed and are negative for acute change except as noted in the HPI. Allergies  Review of patient's allergies indicates no known allergies.  Home Medications   Current Outpatient Rx  Name Route Sig Dispense Refill  . ACETAMINOPHEN 500 MG PO TABS Oral Take 1,000 mg by mouth every 6 (six) hours as needed. For pain     . MEDROXYPROGESTERONE ACETATE 150 MG/ML IM SUSP Intramuscular Inject 150 mg into the muscle every 3 (three) months. Next dose is due on 09/14/11    . ADULT MULTIVITAMIN W/MINERALS CH Oral Take 1 tablet by mouth daily.    Marland Kitchen ZOFRAN PO Oral Take 1 tablet by mouth as needed. For nausea    . PANTOPRAZOLE SODIUM 40 MG PO TBEC Oral Take 40 mg by mouth daily.      BP 139/84  Pulse 112  Temp 98.2 F (36.8 C) (Oral)  Resp 20  Ht 5\' 5"  (1.651 m)  Wt 122 lb (55.339 kg)  BMI 20.30 kg/m2  SpO2  100%  Physical Exam  Nursing note and vitals reviewed. Constitutional: She is oriented to person, place, and time. She appears well-developed and well-nourished.  HENT:  Head: Normocephalic and atraumatic.  Mouth/Throat: Oropharynx is clear and moist.       Left TM mildly erythematous.  Eyes: Conjunctivae normal are normal.  Cardiovascular: Normal rate.   Pulmonary/Chest: Effort normal.  Musculoskeletal: Normal range of motion.  Neurological: She is alert and oriented to person, place, and time.  Skin: Skin is warm and dry.  Psychiatric: She has a normal mood and affect. Her behavior is normal.    ED Course  Procedures (including critical care time) DIAGNOSTIC STUDIES: Oxygen Saturation is 100% on room air, normal by my interpretation.    COORDINATION OF CARE: 16:52: Physical exam performed and pt notified of possible ear infection. Provided intent to prescribe amoxil, Antivert and  meclizine for treatment of infection, dizziness, and vertigo. Pt agreeable.   Labs Reviewed - No data to display No results found.   No diagnosis found.    MDM  History and physical suggest left otitis media. Suspect vertigo secondary to disturbance of semicircular canals. Rx amoxicillin, Antivert .  No meningeal signs I personally performed the services described in this documentation, which was scribed in my presence. The recorded information has been reviewed and considered.  Donnetta Hutching, MD 07/13/12 2149

## 2012-12-24 ENCOUNTER — Encounter (HOSPITAL_COMMUNITY): Payer: Self-pay | Admitting: Emergency Medicine

## 2012-12-24 ENCOUNTER — Emergency Department (HOSPITAL_COMMUNITY)
Admission: EM | Admit: 2012-12-24 | Discharge: 2012-12-25 | Disposition: A | Discharge status: Home / Self Care | Payer: Medicaid Other | Attending: Emergency Medicine | Admitting: Emergency Medicine

## 2012-12-24 DIAGNOSIS — R002 Palpitations: Secondary | ICD-10-CM | POA: Insufficient documentation

## 2012-12-24 DIAGNOSIS — R011 Cardiac murmur, unspecified: Secondary | ICD-10-CM | POA: Insufficient documentation

## 2012-12-24 DIAGNOSIS — Z8719 Personal history of other diseases of the digestive system: Secondary | ICD-10-CM | POA: Insufficient documentation

## 2012-12-24 NOTE — ED Notes (Signed)
Patient complaining of "heart racing" starting approximately 1 hour ago. No other symptoms.

## 2012-12-24 NOTE — ED Provider Notes (Signed)
History     CSN: [id number]  Arrival date & time 12/24/12  2228   First MD Initiated Contact with Patient 12/24/12 2350      Chief Complaint  Patient presents with  . Tachycardia    (Consider location/radiation/quality/duration/timing/severity/associated sxs/prior treatment) HPI Comments: Patient presents with palpitations starting tonight while eating.  She tells me it felt as though she was having extra beats.  No chest pain, sob, or dizziness.  She has had episodes like this in the past and has seen Dr. [name].  He has performed an echo and event monitor, both of which were unremarkable.    Patient is a 32 y.o. female presenting with palpitations.  Palpitations  This is a recurrent problem. The current episode started 1 to 2 hours ago. The problem occurs constantly. The problem has been resolved. The problem is associated with an unknown factor. On average, each episode lasts 1 hour. Pertinent negatives include no diaphoresis, no fever, no chest pain, no chest pressure, no nausea and no shortness of breath. She has tried nothing for the symptoms. There are no known risk factors. Her past medical history does not include anemia, heart disease or hyperthyroidism.    Past Medical History  Diagnosis Date  . GERD (gastroesophageal reflux disease)   . Palpitations   . Heart murmur     Past Surgical History  Procedure Laterality Date  . Dental surgery      History reviewed. No pertinent family history.  History  Substance Use Topics  . Smoking status: Never Smoker   . Smokeless tobacco: Not on file  . Alcohol Use: No    OB History   Grav Para Term Preterm Abortions TAB SAB Ect Mult Living                  Review of Systems  Constitutional: Negative for fever and diaphoresis.  Respiratory: Negative for shortness of breath.   Cardiovascular: Positive for palpitations. Negative for chest pain.  Gastrointestinal: Negative for nausea.  All other systems reviewed and are  negative.    Allergies  Review of patient's allergies indicates no known allergies.  Home Medications   Current Outpatient Rx  Name  Route  Sig  Dispense  Refill  . acetaminophen (TYLENOL) 500 MG tablet   Oral   Take 1,000 mg by mouth every 6 (six) hours as needed. For pain          . amoxicillin (AMOXIL) 500 MG capsule   Oral   Take 1 capsule (500 mg total) by mouth 3 (three) times daily.   30 capsule   0   . meclizine (ANTIVERT) 25 MG tablet   Oral   Take 1 tablet (25 mg total) by mouth every 6 (six) hours as needed for dizziness.   15 tablet   0   . medroxyPROGESTERone (DEPO-PROVERA) 150 MG/ML injection   Intramuscular   Inject 150 mg into the muscle every 3 (three) months. Next dose is due on 09/14/11         . Multiple Vitamin (MULITIVITAMIN WITH MINERALS) TABS   Oral   Take 1 tablet by mouth daily.           BP 142/75  Pulse 127  Temp(Src) 98.2 F (36.8 C) (Oral)  Resp 16  Ht 5\' 5"  (1.651 m)  Wt 125 lb (56.7 kg)  BMI 20.8 kg/m2  SpO2 100%  Physical Exam  Nursing note and vitals reviewed. Constitutional: She is oriented to person, place, and  time. She appears well-developed and well-nourished. No distress.  HENT:  Head: Normocephalic and atraumatic.  Neck: Normal range of motion. Neck supple.  Cardiovascular: Normal rate and regular rhythm.  Exam reveals no gallop and no friction rub.   No murmur heard. Pulmonary/Chest: Effort normal and breath sounds normal. No respiratory distress. She has no wheezes.  Abdominal: Soft. Bowel sounds are normal. She exhibits no distension. There is no tenderness.  Musculoskeletal: Normal range of motion.  Neurological: She is alert and oriented to person, place, and time.  Skin: Skin is warm and dry. She is not diaphoretic.    ED Course  Procedures (including critical care time)  Labs Reviewed  CBC WITH DIFFERENTIAL  COMPREHENSIVE METABOLIC PANEL  HCG, SERUM, QUALITATIVE   No results found.   No  diagnosis found.   Date: 12/24/2012  Rate: 104  Rhythm: sinus tachycardia  QRS Axis: normal  Intervals: normal  ST/T Wave abnormalities: normal  Conduction Disutrbances:none  Narrative Interpretation:   Old EKG Reviewed: unchanged    MDM  The patient presents after a hour duration of feeling extra beats in her heart.  The workup reveals normal blood counts and electrolytes and normal ecg without ectopy.  She was monitored for several hours and no further ectopy or arrhythmia was noted.  She has seen Dr. [name] for this in the past and I have advised her to follow up with him should her symptoms continue.          Sudie Grumbling, MD 12/25/12 [phone]

## 2012-12-25 LAB — CBC WITH DIFFERENTIAL/PLATELET
Basophils Absolute: 0 10*3/uL (ref 0.0–0.1)
Basophils Relative: 0 % (ref 0–1)
Eosinophils Absolute: 0 10*3/uL (ref 0.0–0.7)
Eosinophils Relative: 1 % (ref 0–5)
HCT: 38.1 % (ref 36.0–46.0)
Hemoglobin: 13 g/dL (ref 12.0–15.0)
Lymphocytes Relative: 31 % (ref 12–46)
Lymphs Abs: 1.6 10*3/uL (ref 0.7–4.0)
MCH: 31.4 pg (ref 26.0–34.0)
MCHC: 34.1 g/dL (ref 30.0–36.0)
MCV: 92 fL (ref 78.0–100.0)
Monocytes Absolute: 0.3 10*3/uL (ref 0.1–1.0)
Monocytes Relative: 6 % (ref 3–12)
Neutro Abs: 3.3 10*3/uL (ref 1.7–7.7)
Neutrophils Relative %: 62 % (ref 43–77)
Platelets: 315 10*3/uL (ref 150–400)
RBC: 4.14 MIL/uL (ref 3.87–5.11)
RDW: 13.2 % (ref 11.5–15.5)
WBC: 5.3 10*3/uL (ref 4.0–10.5)

## 2012-12-25 LAB — COMPREHENSIVE METABOLIC PANEL
ALT: 15 U/L (ref 0–35)
AST: 20 U/L (ref 0–37)
Albumin: 3.7 g/dL (ref 3.5–5.2)
Alkaline Phosphatase: 40 U/L (ref 39–117)
BUN: 16 mg/dL (ref 6–23)
CO2: 25 mEq/L (ref 19–32)
Calcium: 9.1 mg/dL (ref 8.4–10.5)
Chloride: 106 mEq/L (ref 96–112)
Creatinine, Ser: 0.71 mg/dL (ref 0.50–1.10)
GFR calc Af Amer: >90 mL/min (ref >90)
GFR calc non Af Amer: >90 mL/min (ref >90)
Glucose, Bld: 106 mg/dL — ABNORMAL HIGH (ref 70–99)
Potassium: 3.8 mEq/L (ref 3.5–5.1)
Sodium: 140 mEq/L (ref 135–145)
Total Bilirubin: 0.2 mg/dL — ABNORMAL LOW (ref 0.3–1.2)
Total Protein: 6.5 g/dL (ref 6.0–8.3)

## 2012-12-25 LAB — HCG, SERUM, QUALITATIVE: Preg, Serum: NEGATIVE

## 2013-01-20 ENCOUNTER — Encounter (INDEPENDENT_AMBULATORY_CARE_PROVIDER_SITE_OTHER): Payer: Self-pay | Admitting: *Deleted

## 2013-02-09 ENCOUNTER — Encounter: Payer: Self-pay | Admitting: *Deleted

## 2013-02-10 ENCOUNTER — Encounter: Payer: Self-pay | Admitting: Obstetrics & Gynecology

## 2013-02-10 ENCOUNTER — Ambulatory Visit (INDEPENDENT_AMBULATORY_CARE_PROVIDER_SITE_OTHER): Payer: BC Managed Care – PPO | Admitting: Obstetrics & Gynecology

## 2013-02-10 VITALS — BP 128/80 | Ht 65.0 in | Wt 126.8 lb

## 2013-02-10 DIAGNOSIS — Z3200 Encounter for pregnancy test, result unknown: Secondary | ICD-10-CM

## 2013-02-10 DIAGNOSIS — Z3049 Encounter for surveillance of other contraceptives: Secondary | ICD-10-CM

## 2013-02-10 DIAGNOSIS — Z309 Encounter for contraceptive management, unspecified: Secondary | ICD-10-CM

## 2013-02-10 DIAGNOSIS — Z3202 Encounter for pregnancy test, result negative: Secondary | ICD-10-CM

## 2013-02-10 LAB — POCT URINE PREGNANCY: Preg Test, Ur: NEGATIVE

## 2013-02-10 MED ORDER — MEDROXYPROGESTERONE ACETATE 150 MG/ML IM SUSP
150.0000 mg | Freq: Once | INTRAMUSCULAR | Status: AC
  Administered 2013-02-10: 150 mg via INTRAMUSCULAR

## 2013-02-14 ENCOUNTER — Ambulatory Visit (INDEPENDENT_AMBULATORY_CARE_PROVIDER_SITE_OTHER): Payer: Medicaid Other | Admitting: Internal Medicine

## 2013-05-05 ENCOUNTER — Ambulatory Visit: Payer: Medicaid Other

## 2013-05-05 ENCOUNTER — Encounter: Payer: Self-pay | Admitting: Obstetrics & Gynecology

## 2013-05-05 ENCOUNTER — Other Ambulatory Visit (HOSPITAL_COMMUNITY)
Admission: RE | Admit: 2013-05-05 | Discharge: 2013-05-05 | Disposition: A | Discharge status: Home / Self Care | Payer: BC Managed Care – PPO | Source: Ambulatory Visit | Attending: Obstetrics & Gynecology | Admitting: Obstetrics & Gynecology

## 2013-05-05 ENCOUNTER — Ambulatory Visit (INDEPENDENT_AMBULATORY_CARE_PROVIDER_SITE_OTHER): Payer: BC Managed Care – PPO | Admitting: Obstetrics & Gynecology

## 2013-05-05 VITALS — BP 130/80 | Ht 63.0 in | Wt 128.0 lb

## 2013-05-05 DIAGNOSIS — Z1151 Encounter for screening for human papillomavirus (HPV): Secondary | ICD-10-CM | POA: Insufficient documentation

## 2013-05-05 DIAGNOSIS — Z01419 Encounter for gynecological examination (general) (routine) without abnormal findings: Secondary | ICD-10-CM

## 2013-05-05 DIAGNOSIS — Z309 Encounter for contraceptive management, unspecified: Secondary | ICD-10-CM

## 2013-05-05 DIAGNOSIS — Z3200 Encounter for pregnancy test, result unknown: Secondary | ICD-10-CM

## 2013-05-05 DIAGNOSIS — R87810 Cervical high risk human papillomavirus (HPV) DNA test positive: Secondary | ICD-10-CM | POA: Insufficient documentation

## 2013-05-05 DIAGNOSIS — Z3202 Encounter for pregnancy test, result negative: Secondary | ICD-10-CM

## 2013-05-05 DIAGNOSIS — Z113 Encounter for screening for infections with a predominantly sexual mode of transmission: Secondary | ICD-10-CM | POA: Insufficient documentation

## 2013-05-05 LAB — HIV ANTIBODY (ROUTINE TESTING W REFLEX): HIV: NONREACTIVE

## 2013-05-05 LAB — RPR

## 2013-05-05 LAB — POCT URINE PREGNANCY: Preg Test, Ur: NEGATIVE

## 2013-05-05 LAB — HEPATITIS B SURFACE ANTIGEN: Hepatitis B Surface Ag: NEGATIVE

## 2013-05-05 LAB — HEPATITIS C ANTIBODY: HCV Ab: NEGATIVE

## 2013-05-05 MED ORDER — MEDROXYPROGESTERONE ACETATE 150 MG/ML IM SUSP
150.0000 mg | Freq: Once | INTRAMUSCULAR | Status: AC
  Administered 2013-05-05: 150 mg via INTRAMUSCULAR

## 2013-05-05 NOTE — Progress Notes (Signed)
Patient ID: Paula Riley, female   DOB: Sep 29, 1980, 32 y.o.   MRN: [id number] Subjective:     Paula Riley is a 32 y.o. female here for a routine exam.  No LMP recorded. Patient has had an injection. G3P1010 Current complaints: none.  Personal health questionnaire reviewed: no.   Gynecologic History No LMP recorded. Patient has had an injection. Contraception: depo provera Last Pap: 2013. Results were: normal Last mammogram: na. Results were: na  Obstetric History OB History  Gravida Para Term Preterm AB SAB TAB Ectopic Multiple Living  3 2 1  1          # Outcome Date GA Lbr Len/2nd Weight Sex Delivery Anes PTL Lv  3 TRM 03/10/11 [redacted]w[redacted]d  5 lb 11 oz (2.58 kg) M SVD     2 PAR 07/20/06    M SVD EPI    1 ABT                The following portions of the patient's history were reviewed and updated as appropriate: allergies, current medications, past family history, past medical history, past social history, past surgical history and problem list.  Review of Systems  Review of Systems  Constitutional: Negative for fever, chills, weight loss, malaise/fatigue and diaphoresis.  HENT: Negative for hearing loss, ear pain, nosebleeds, congestion, sore throat, neck pain, tinnitus and ear discharge.   Eyes: Negative for blurred vision, double vision, photophobia, pain, discharge and redness.  Respiratory: Negative for cough, hemoptysis, sputum production, shortness of breath, wheezing and stridor.   Cardiovascular: Negative for chest pain, palpitations, orthopnea, claudication, leg swelling and PND.  Gastrointestinal: negative for abdominal pain. Negative for heartburn, nausea, vomiting, diarrhea, constipation, blood in stool and melena.  Genitourinary: Negative for dysuria, urgency, frequency, hematuria and flank pain.  Musculoskeletal: Negative for myalgias, back pain, joint pain and falls.  Skin: Negative for itching and rash.  Neurological: Negative for dizziness, tingling, tremors,  sensory change, speech change, focal weakness, seizures, loss of consciousness, weakness and headaches.  Endo/Heme/Allergies: Negative for environmental allergies and polydipsia. Does not bruise/bleed easily.  Psychiatric/Behavioral: Negative for depression, suicidal ideas, hallucinations, memory loss and substance abuse. The patient is not nervous/anxious and does not have insomnia.        Objective:    Physical Exam  Vitals reviewed. Constitutional: She is oriented to person, place, and time. She appears well-developed and well-nourished.  HENT:  Head: Normocephalic and atraumatic.        Right Ear: External ear normal.  Left Ear: External ear normal.  Nose: Nose normal.  Mouth/Throat: Oropharynx is clear and moist.  Eyes: Conjunctivae and EOM are normal. Pupils are equal, round, and reactive to light. Right eye exhibits no discharge. Left eye exhibits no discharge. No scleral icterus.  Neck: Normal range of motion. Neck supple. No tracheal deviation present. No thyromegaly present.  Cardiovascular: Normal rate, regular rhythm, normal heart sounds and intact distal pulses.  Exam reveals no gallop and no friction rub.   No murmur heard. Respiratory: Effort normal and breath sounds normal. No respiratory distress. She has no wheezes. She has no rales. She exhibits no tenderness.  GI: Soft. Bowel sounds are normal. She exhibits no distension and no mass. There is no tenderness. There is no rebound and no guarding.  Genitourinary:       Vulva is normal without lesions Vagina is pink moist without discharge Cervix normal in appearance and pap is done Uterus is normal size shape and contour Adnexa  is negative with normal sized ovaries   Musculoskeletal: Normal range of motion. She exhibits no edema and no tenderness.  Neurological: She is alert and oriented to person, place, and time. She has normal reflexes. She displays normal reflexes. No cranial nerve deficit. She exhibits normal muscle  tone. Coordination normal.  Skin: Skin is warm and dry. No rash noted. No erythema. No pallor.  Psychiatric: She has a normal mood and affect. Her behavior is normal. Judgment and thought content normal.       Assessment:    Healthy female exam.    Plan:    Follow up in: 1 year.   Continue depo provera

## 2013-05-05 NOTE — Patient Instructions (Addendum)

## 2013-05-06 ENCOUNTER — Encounter: Payer: Self-pay | Admitting: Obstetrics & Gynecology

## 2013-05-06 LAB — HSV 2 ANTIBODY, IGG: HSV 2 Glycoprotein G Ab, IgG: <0.1 IV

## 2013-07-28 ENCOUNTER — Ambulatory Visit: Payer: Medicaid Other

## 2013-08-03 ENCOUNTER — Encounter: Payer: Self-pay | Admitting: Adult Health

## 2013-08-03 ENCOUNTER — Ambulatory Visit (INDEPENDENT_AMBULATORY_CARE_PROVIDER_SITE_OTHER): Payer: BC Managed Care – PPO | Admitting: Adult Health

## 2013-08-03 VITALS — BP 130/70 | Ht 65.0 in | Wt 131.0 lb

## 2013-08-03 DIAGNOSIS — Z309 Encounter for contraceptive management, unspecified: Secondary | ICD-10-CM

## 2013-08-03 DIAGNOSIS — Z3202 Encounter for pregnancy test, result negative: Secondary | ICD-10-CM

## 2013-08-03 DIAGNOSIS — O09219 Supervision of pregnancy with history of pre-term labor, unspecified trimester: Secondary | ICD-10-CM

## 2013-08-03 LAB — POCT URINE PREGNANCY: Preg Test, Ur: NEGATIVE

## 2013-08-03 MED ORDER — MEDROXYPROGESTERONE ACETATE 150 MG/ML IM SUSP
150.0000 mg | Freq: Once | INTRAMUSCULAR | Status: AC
  Administered 2013-08-03: 150 mg via INTRAMUSCULAR

## 2013-08-03 NOTE — Progress Notes (Signed)
Patient ID: Paula Riley, female   DOB: 1981/08/27, 32 y.o.   MRN: [id number] Depo provera 150 mg given left deltoid with no complaints, negative pregnancy test

## 2013-10-20 ENCOUNTER — Encounter (HOSPITAL_COMMUNITY): Payer: Self-pay | Admitting: Emergency Medicine

## 2013-10-20 ENCOUNTER — Emergency Department (HOSPITAL_COMMUNITY)
Admission: EM | Admit: 2013-10-20 | Discharge: 2013-10-20 | Disposition: A | Discharge status: Home / Self Care | Payer: BC Managed Care – PPO | Attending: Emergency Medicine | Admitting: Emergency Medicine

## 2013-10-20 DIAGNOSIS — R233 Spontaneous ecchymoses: Secondary | ICD-10-CM | POA: Insufficient documentation

## 2013-10-20 DIAGNOSIS — R011 Cardiac murmur, unspecified: Secondary | ICD-10-CM | POA: Insufficient documentation

## 2013-10-20 DIAGNOSIS — Z8719 Personal history of other diseases of the digestive system: Secondary | ICD-10-CM | POA: Insufficient documentation

## 2013-10-20 DIAGNOSIS — T148XXA Other injury of unspecified body region, initial encounter: Secondary | ICD-10-CM

## 2013-10-20 DIAGNOSIS — R002 Palpitations: Secondary | ICD-10-CM | POA: Insufficient documentation

## 2013-10-20 NOTE — ED Provider Notes (Signed)
CSN: [id number]     Arrival date & time 10/20/13  1752 History   First MD Initiated Contact with Patient 10/20/13 1818     Chief Complaint  Patient presents with  . bruise    (Consider location/radiation/quality/duration/timing/severity/associated sxs/prior Treatment) HPI Comments: Patient presents to the emergency department with complaint of a bruise above her right knee. The patient states that on yesterday after coming from the bathroom she noticed a bruise just above the right knee. She says it is only painful when she palpates along that area. She denies any recent injury. Has not been a change in exercises. No operations or procedures involving the right knee or right upper leg. No history of being on any blood thinning type medications. And no history of bleeding disorders. The patient does not recall an injury in this particular area. She is not on any long-term prednisone or similar medications.  The history is provided by the patient.    Past Medical History  Diagnosis Date  . GERD (gastroesophageal reflux disease)   . Palpitations   . Heart murmur    Past Surgical History  Procedure Laterality Date  . Dental surgery     Family History  Problem Relation Age of Onset  . Hypertension Other   . CAD Other   . Diabetes Other    History  Substance Use Topics  . Smoking status: Never Smoker   . Smokeless tobacco: Never Used  . Alcohol Use: No   OB History   Grav Para Term Preterm Abortions TAB SAB Ect Mult Living   3 2 1  1           Review of Systems  Constitutional: Negative for activity change.       All ROS Neg except as noted in HPI  HENT: Negative for nosebleeds.   Eyes: Negative for photophobia and discharge.  Respiratory: Negative for cough, shortness of breath and wheezing.   Cardiovascular: Positive for palpitations. Negative for chest pain.  Gastrointestinal: Negative for abdominal pain and blood in stool.  Genitourinary: Negative for dysuria, frequency  and hematuria.  Musculoskeletal: Negative for arthralgias, back pain and neck pain.  Skin: Negative.   Neurological: Negative for dizziness, seizures and speech difficulty.  Psychiatric/Behavioral: Negative for hallucinations and confusion.    Allergies  Review of patient's allergies indicates no known allergies.  Home Medications   Current Outpatient Rx  Name  Route  Sig  Dispense  Refill  . acetaminophen (TYLENOL) 500 MG tablet   Oral   Take 1,000 mg by mouth every 6 (six) hours as needed. For pain          . medroxyPROGESTERone (DEPO-PROVERA) 150 MG/ML injection   Intramuscular   Inject 150 mg into the muscle every 3 (three) months. Next dose is due on 09/14/11          BP 137/22  Pulse 120  Temp(Src) 98.8 F (37.1 C) (Oral)  Resp 16  SpO2 100% Physical Exam  Nursing note and vitals reviewed. Constitutional: She is oriented to person, place, and time. She appears well-developed and well-nourished.  Non-toxic appearance.  HENT:  Head: Normocephalic.  Right Ear: Tympanic membrane and external ear normal.  Left Ear: Tympanic membrane and external ear normal.  Eyes: EOM and lids are normal. Pupils are equal, round, and reactive to light.  Neck: Normal range of motion. Neck supple. Carotid bruit is not present.  Cardiovascular: Normal rate, regular rhythm, normal heart sounds, intact distal pulses and normal pulses.  Pulmonary/Chest: Breath sounds normal. No respiratory distress.  Abdominal: Soft. Bowel sounds are normal. There is no tenderness. There is no guarding.  Musculoskeletal: Normal range of motion.       Legs: Lymphadenopathy:       Head (right side): No submandibular adenopathy present.       Head (left side): No submandibular adenopathy present.    She has no cervical adenopathy.  Neurological: She is alert and oriented to person, place, and time. She has normal strength. No cranial nerve deficit or sensory deficit.  Skin: Skin is warm and dry.    Psychiatric: She has a normal mood and affect. Her speech is normal.    ED Course  Procedures (including critical care time) Labs Review Labs Reviewed - No data to display Imaging Review No results found.  EKG Interpretation   None       MDM  No diagnosis found. **I have reviewed nursing notes, vital signs, and all appropriate lab and imaging results for this patient.*  Patient states that on yesterday she noticed a small bruise just above the right knee. She noticed some soreness on yesterday, less soreness today. The patient was concerned because she has pain in her legs when she sits down for a few minutes or when she gets out of a warm bath. She was concerned about possible blood clotting. She called the nurses line and was told come to the emergency apartment for evaluation.  No acute problems noted on the examination. The area is not hot. The area is minimally painful. There is no red streaking noted. No hematoma under the skin. The femoral pulse popliteal pulse and dorsalis pedis pulse are 2+. There is a negative Homans sign. Suspect the patient has bruise to the leg from occult injury.  Kathie Dike, PA-C 10/20/13 [phone]

## 2013-10-20 NOTE — ED Notes (Signed)
HR of 120. Dx with anxiety but pt does not like to take the medication. NAD.

## 2013-10-20 NOTE — ED Notes (Signed)
Pt states she noticed small bruise above right knee. Bruised is only painful when palpated. Denies SOB. States she called the nurse line and was told to come have it checked.

## 2013-10-20 NOTE — Discharge Instructions (Signed)
Your examination is within normal limits with exception that your heart rate is elevated. I find no other bruising other than the one noted on your right thigh/knee area. Please see Dr. Jean RosenthalJackson for additional evaluation of this area, but no acute emergency found tonight.

## 2013-10-25 NOTE — ED Provider Notes (Signed)
Medical screening examination/treatment/procedure(s) were performed by non-physician practitioner and as supervising physician I was immediately available for consultation/collaboration.  EKG Interpretation   None         Gilda Creasehristopher J. Pollina, MD 10/25/13 [phone]

## 2013-10-27 ENCOUNTER — Ambulatory Visit: Payer: BC Managed Care – PPO

## 2013-10-28 ENCOUNTER — Other Ambulatory Visit: Payer: Self-pay | Admitting: Adult Health

## 2013-10-31 ENCOUNTER — Encounter: Payer: Self-pay | Admitting: Obstetrics & Gynecology

## 2013-10-31 ENCOUNTER — Ambulatory Visit (INDEPENDENT_AMBULATORY_CARE_PROVIDER_SITE_OTHER): Payer: 59 | Admitting: Obstetrics & Gynecology

## 2013-10-31 VITALS — BP 120/70 | Ht 65.0 in | Wt 122.5 lb

## 2013-10-31 DIAGNOSIS — Z309 Encounter for contraceptive management, unspecified: Secondary | ICD-10-CM

## 2013-10-31 DIAGNOSIS — Z3049 Encounter for surveillance of other contraceptives: Secondary | ICD-10-CM

## 2013-10-31 DIAGNOSIS — Z3202 Encounter for pregnancy test, result negative: Secondary | ICD-10-CM

## 2013-10-31 LAB — POCT URINE PREGNANCY: Preg Test, Ur: NEGATIVE

## 2013-10-31 MED ORDER — MEDROXYPROGESTERONE ACETATE 150 MG/ML IM SUSP
150.0000 mg | Freq: Once | INTRAMUSCULAR | Status: AC
  Administered 2013-10-31: 150 mg via INTRAMUSCULAR

## 2013-10-31 NOTE — Progress Notes (Signed)
Pt here for Depo shot. Pt reports no complaints at this time. To return in 12 weeks for next shot. JSY

## 2014-01-22 ENCOUNTER — Other Ambulatory Visit: Payer: Self-pay | Admitting: Adult Health

## 2014-01-23 ENCOUNTER — Ambulatory Visit: Payer: 59

## 2014-01-24 ENCOUNTER — Ambulatory Visit (INDEPENDENT_AMBULATORY_CARE_PROVIDER_SITE_OTHER): Payer: 59 | Admitting: Obstetrics & Gynecology

## 2014-01-24 ENCOUNTER — Encounter: Payer: Self-pay | Admitting: Obstetrics & Gynecology

## 2014-01-24 VITALS — BP 130/70 | Ht 65.0 in | Wt 127.5 lb

## 2014-01-24 DIAGNOSIS — Z309 Encounter for contraceptive management, unspecified: Secondary | ICD-10-CM

## 2014-01-24 DIAGNOSIS — Z3049 Encounter for surveillance of other contraceptives: Secondary | ICD-10-CM

## 2014-01-24 DIAGNOSIS — Z3202 Encounter for pregnancy test, result negative: Secondary | ICD-10-CM

## 2014-01-24 LAB — POCT URINE PREGNANCY: Preg Test, Ur: NEGATIVE

## 2014-01-24 MED ORDER — MEDROXYPROGESTERONE ACETATE 150 MG/ML IM SUSP
150.0000 mg | Freq: Once | INTRAMUSCULAR | Status: AC
  Administered 2014-01-24: 150 mg via INTRAMUSCULAR

## 2014-01-24 NOTE — Progress Notes (Signed)
Pt here for Depo shot. No complaints at this time. To return in 12 weeks for next Depo. 

## 2014-04-18 ENCOUNTER — Ambulatory Visit (INDEPENDENT_AMBULATORY_CARE_PROVIDER_SITE_OTHER): Payer: 59 | Admitting: Adult Health

## 2014-04-18 DIAGNOSIS — Z3200 Encounter for pregnancy test, result unknown: Secondary | ICD-10-CM

## 2014-04-18 DIAGNOSIS — Z3042 Encounter for surveillance of injectable contraceptive: Secondary | ICD-10-CM

## 2014-04-18 DIAGNOSIS — Z3049 Encounter for surveillance of other contraceptives: Secondary | ICD-10-CM

## 2014-04-18 LAB — POCT URINE PREGNANCY: Preg Test, Ur: NEGATIVE

## 2014-04-18 MED ORDER — MEDROXYPROGESTERONE ACETATE 150 MG/ML IM SUSP
150.0000 mg | Freq: Once | INTRAMUSCULAR | Status: AC
  Administered 2014-04-18: 150 mg via INTRAMUSCULAR

## 2014-05-09 ENCOUNTER — Other Ambulatory Visit: Payer: 59 | Admitting: Advanced Practice Midwife

## 2014-05-18 ENCOUNTER — Other Ambulatory Visit: Payer: 59 | Admitting: Advanced Practice Midwife

## 2014-05-30 ENCOUNTER — Ambulatory Visit (INDEPENDENT_AMBULATORY_CARE_PROVIDER_SITE_OTHER): Payer: 59 | Admitting: Women's Health

## 2014-05-30 ENCOUNTER — Encounter: Payer: Self-pay | Admitting: Women's Health

## 2014-05-30 ENCOUNTER — Other Ambulatory Visit (HOSPITAL_COMMUNITY)
Admission: RE | Admit: 2014-05-30 | Discharge: 2014-05-30 | Disposition: A | Discharge status: Home / Self Care | Payer: 59 | Source: Ambulatory Visit | Attending: Advanced Practice Midwife | Admitting: Advanced Practice Midwife

## 2014-05-30 VITALS — BP 122/80 | Ht 65.0 in | Wt 135.0 lb

## 2014-05-30 DIAGNOSIS — Z01419 Encounter for gynecological examination (general) (routine) without abnormal findings: Secondary | ICD-10-CM | POA: Insufficient documentation

## 2014-05-30 DIAGNOSIS — Z3049 Encounter for surveillance of other contraceptives: Secondary | ICD-10-CM

## 2014-05-30 DIAGNOSIS — Z113 Encounter for screening for infections with a predominantly sexual mode of transmission: Secondary | ICD-10-CM | POA: Diagnosis present

## 2014-05-30 DIAGNOSIS — Z1151 Encounter for screening for human papillomavirus (HPV): Secondary | ICD-10-CM | POA: Diagnosis present

## 2014-05-30 DIAGNOSIS — R87810 Cervical high risk human papillomavirus (HPV) DNA test positive: Secondary | ICD-10-CM

## 2014-05-30 NOTE — Progress Notes (Signed)
Patient ID: Paula Riley, female   DOB: Dec 12, 1980, 33 y.o.   MRN: [id number] Subjective:   Wynnie Pacetti is a 33 y.o. G49P2012 African American female here for a routine FP Mc[facility] well-woman exam.  No LMP recorded. Patient has had an injection.   Doing well on depo.  Current complaints: none PCP: [place]        Social History: Sexual: heterosexual Marital Status: dating Living situation: alone w/ children Occupation: works at [employer] Tobacco/alcohol: no tobacco, no etoh Illicit drugs: no history of illicit drug use  The following portions of the patient's history were reviewed and updated as appropriate: allergies, current medications, past family history, past medical history, past social history, past surgical history and problem list.  Past Medical History Past Medical History  Diagnosis Date  . GERD (gastroesophageal reflux disease)   . Palpitations   . Heart murmur     Past Surgical History Past Surgical History  Procedure Laterality Date  . Dental surgery      Gynecologic History G3P1010  No LMP recorded. Patient has had an injection. Contraception: Depo-Provera injections Last Pap: 04/2013. Results were: neg pap w/ +HRHPV Last mammogram: never. Results were: n/a Last TCS: never  Obstetric History OB History  Gravida Para Term Preterm AB SAB TAB Ectopic Multiple Living  # Outcome Date GA Lbr Len/2nd Weight Sex Delivery Anes PTL Lv  3 TRM 03/10/11 [redacted]w[redacted]d  5 lb 11 oz (2.58 kg) M SVD     2 PAR 07/20/06    M SVD EPI    1 ABT               Current Medications Current Outpatient Prescriptions on File Prior to Visit  Medication Sig Dispense Refill  . medroxyPROGESTERone (DEPO-PROVERA) 150 MG/ML injection BRING TO DOCTOR'S OFFICE AS DIRECTED EVERY 3 MONTHS  1 mL  3  . acetaminophen (TYLENOL) 500 MG tablet Take 1,000 mg by mouth every 6 (six) hours as needed. For pain       . [DISCONTINUED] famotidine (PEPCID) 20 MG tablet Take 1 tablet (20 mg total) by  mouth 2 (two) times daily.  30 tablet  0   No current facility-administered medications on file prior to visit.    Review of Systems Patient denies any headaches, blurred vision, shortness of breath, chest pain, abdominal pain, problems with bowel movements, urination, or intercourse.  Objective:  BP 122/80  Ht  (1.651 m)  Wt 135 lb (61.236 kg)  BMI 22.47 kg/m2 Physical Exam  General:  Well developed, well nourished, no acute distress. She is alert and oriented x3. Skin:  Warm and dry Neck:  Midline trachea, no thyromegaly or nodules Cardiovascular: Regular rate and rhythm, no murmur heard Lungs:  Effort normal, all lung fields clear to auscultation bilaterally Breasts:  No dominant palpable mass, retraction, or nipple discharge Abdomen:  Soft, non tender, no hepatosplenomegaly or masses Pelvic:  External genitalia is normal in appearance.  The vagina is normal in appearance. The cervix is bulbous, no CMT.  Thin prep pap is done w/ HR HPV cotesting. Uterus is felt to be normal size, shape, and contour.  No adnexal masses or tenderness noted. Extremities:  No swelling or varicosities noted Psych:  She has a normal mood and affect  Assessment:   FP Mcaid Healthy well-woman exam Neg pap w/ +HRHPV last year STI screening  Plan:  GC/CT from pap, HIV, RPR, HSV2 today F/U  q for depo, then 22yr for physical or sooner if needed Mammogram  or sooner if problems Colonoscopy  or sooner if problems  Marge Duncans CNM, [facility] 05/30/2014 9:06 AM

## 2014-05-30 NOTE — Patient Instructions (Signed)
Human Papillomavirus Human papillomavirus (HPV) is the most common sexually transmitted infection (STI) and is highly contagious. HPV infections cause genital warts and cancers to the outlet of the womb (cervix), birth canal (vagina), opening of the birth canal (vulva), and anus. There are over 100 types of HPV. Four types of HPV are responsible for causing 70% of all cervical cancers. Ninety percent of anal cancers and genital warts are caused by HPV. Unless you have wart-like lesions in the throat or genital warts that you can see or feel, HPV usually does not cause symptoms. Therefore, people can be infected for long periods and pass it on to others without knowing it. HPV in pregnancy usually does not cause a problem for the mother or baby. If the mother has genital warts, the baby rarely gets infected. When the HPV infection is found to be pre-cancerous on the cervix, vagina, or vulva, the mother will be followed closely during the pregnancy. Any needed treatment will be done after the baby is born. CAUSES   Having unprotected sex. HPV can be spread by oral, vaginal, or anal sexual activity.  Having several sex partners.  Having a sex partner who has other sex partners.  Having or having had another sexually transmitted infection. SYMPTOMS   More than 90% of people carrying HPV cannot tell anything is wrong.  Wart-like lesions in the throat (from having oral sex).  Warts in the infected skin or mucous membranes.  Genital warts may itch, burn, or bleed.  Genital warts may be painful or bleed during sexual intercourse. DIAGNOSIS   Genital warts are easily seen with the naked eye.  Currently, there is no FDA-approved test to detect HPV in males.  In females, a Pap test can show cells which are infected with HPV.  In females, a scope can be used to view the cervix (colposcopy). A colposcopy can be performed if the pelvic exam or Pap test is abnormal.  In females, a sample of tissue  may be removed (biopsy) during the colposcopy. TREATMENT   Treatment of genital warts can include:  Podophyllin lotion or gel.  Bichloroacetic acid (BCA) or trichloroacetic acid (TCA).  Podofilox solution or gel.  Imiquimod cream.  Interferon injections.  Use of a probe to apply extreme cold (cryotherapy).  Application of an intense beam of light (laser treatment).  Use of a probe to apply extreme heat (electrocautery).  Surgery.  HPV of the cervix, vagina, or vulva can be treated with:  Cryotherapy.  Laser treatment.  Electrocautery.  Surgery. Your caregiver will follow you closely after you are treated. This is because the HPV can come back and may need treatment again. HOME CARE INSTRUCTIONS   Follow your caregiver's instructions regarding medications, Pap tests, and follow-up exams.  Do not touch or scratch the warts.  Do not treat genital warts with medication used for treating hand warts.  Tell your sex partner about your infection because he or she may also need treatment.  Do not have sex while you are being treated.  After treatment, use condoms during sex to prevent future infections.  Have only 1 sex partner.  Have a sex partner who does not have other sex partners.  Use over-the-counter creams for itching or irritation as directed by your caregiver.  Use over-the-counter or prescription medicines for pain, discomfort, or fever as directed by your caregiver.  Do not douche or use tampons during treatment of HPV. PREVENTION   Talk to your caregiver about getting the HPV   vaccines. These vaccines prevent some HPV infections and cancers. It is recommended that the vaccine be given to males and females between the age of 9 and 26 years old. It will not work if you already have HPV and it is not recommended for pregnant women. The vaccines are not recommended for pregnant women.  Call your caregiver if you think you are pregnant and have the HPV.  A  PAP test is done to screen for cervical cancer.  The first PAP test should be done at age 21.  Between ages 21 and 29, PAP tests are repeated every 2 years.  Beginning at age 30, you are advised to have a PAP test every 3 years as long as your past 3 PAP tests have been normal.  Some women have medical problems that increase the chance of getting cervical cancer. Talk to your caregiver about these problems. It is especially important to talk to your caregiver if a new problem develops soon after your last PAP test. In these cases, your caregiver may recommend more frequent screening and Pap tests.  The above recommendations are the same for women who have or have not gotten the vaccine for HPV (Human Papillomavirus).  If you had a hysterectomy for a problem that was not a cancer or a condition that could lead to cancer, then you no longer need Pap tests. However, even if you no longer need a PAP test, a regular exam is a good idea to make sure no other problems are starting.   If you are between ages 65 and 70, and you have had normal Pap tests going back 10 years, you no longer need Pap tests. However, even if you no longer need a PAP test, a regular exam is a good idea to make sure no other problems are starting.  If you have had past treatment for cervical cancer or a condition that could lead to cancer, you need Pap tests and screening for cancer for at least 20 years after your treatment.  If Pap tests have been discontinued, risk factors (such as a new sexual partner)need to be re-assessed to determine if screening should be resumed.  Some women may need screenings more often if they are at high risk for cervical cancer. SEEK MEDICAL CARE IF:   The treated skin becomes red, swollen or painful.  You have an oral temperature above 102 F (38.9 C).  You feel generally ill.  You feel lumps or pimple-like projections in and around your genital area.  You develop bleeding of the  vagina or the treatment area.  You develop painful sexual intercourse. Document Released: 11/22/2003 Document Revised: 11/24/2011 Document Reviewed: 12/07/2013 [employer] Patient Information 2015 [employer]. This information is not intended to replace advice given to you by your health care provider. Make sure you discuss any questions you have with your health care provider.  

## 2014-05-31 LAB — RPR

## 2014-05-31 LAB — HIV ANTIBODY (ROUTINE TESTING W REFLEX): HIV 1&2 Ab, 4th Generation: NONREACTIVE

## 2014-05-31 LAB — HSV 2 ANTIBODY, IGG: HSV 2 Glycoprotein G Ab, IgG: <0.1 IV

## 2014-06-01 LAB — CYTOLOGY - PAP

## 2014-07-11 ENCOUNTER — Ambulatory Visit: Payer: 59

## 2014-07-12 ENCOUNTER — Encounter: Payer: Self-pay | Admitting: Adult Health

## 2014-07-12 ENCOUNTER — Ambulatory Visit (INDEPENDENT_AMBULATORY_CARE_PROVIDER_SITE_OTHER): Payer: 59 | Admitting: Adult Health

## 2014-07-12 DIAGNOSIS — Z3042 Encounter for surveillance of injectable contraceptive: Secondary | ICD-10-CM

## 2014-07-12 DIAGNOSIS — Z3202 Encounter for pregnancy test, result negative: Secondary | ICD-10-CM

## 2014-07-12 DIAGNOSIS — Z308 Encounter for other contraceptive management: Secondary | ICD-10-CM

## 2014-07-12 LAB — POCT URINE PREGNANCY: Preg Test, Ur: NEGATIVE

## 2014-07-12 MED ORDER — MEDROXYPROGESTERONE ACETATE 150 MG/ML IM SUSP
150.0000 mg | Freq: Once | INTRAMUSCULAR | Status: AC
  Administered 2014-07-12: 150 mg via INTRAMUSCULAR

## 2014-07-17 ENCOUNTER — Encounter: Payer: Self-pay | Admitting: Adult Health

## 2014-08-14 ENCOUNTER — Emergency Department (HOSPITAL_COMMUNITY)
Admission: EM | Admit: 2014-08-14 | Discharge: 2014-08-14 | Disposition: A | Discharge status: Home / Self Care | Payer: 59 | Attending: Emergency Medicine | Admitting: Emergency Medicine

## 2014-08-14 ENCOUNTER — Encounter (HOSPITAL_COMMUNITY): Payer: Self-pay | Admitting: Emergency Medicine

## 2014-08-14 ENCOUNTER — Telehealth: Payer: Self-pay | Admitting: Cardiology

## 2014-08-14 DIAGNOSIS — Z3202 Encounter for pregnancy test, result negative: Secondary | ICD-10-CM | POA: Insufficient documentation

## 2014-08-14 DIAGNOSIS — K219 Gastro-esophageal reflux disease without esophagitis: Secondary | ICD-10-CM | POA: Insufficient documentation

## 2014-08-14 DIAGNOSIS — R002 Palpitations: Secondary | ICD-10-CM | POA: Diagnosis not present

## 2014-08-14 DIAGNOSIS — R1013 Epigastric pain: Secondary | ICD-10-CM | POA: Diagnosis not present

## 2014-08-14 DIAGNOSIS — R011 Cardiac murmur, unspecified: Secondary | ICD-10-CM | POA: Diagnosis not present

## 2014-08-14 DIAGNOSIS — R2241 Localized swelling, mass and lump, right lower limb: Secondary | ICD-10-CM | POA: Insufficient documentation

## 2014-08-14 DIAGNOSIS — F419 Anxiety disorder, unspecified: Secondary | ICD-10-CM | POA: Diagnosis present

## 2014-08-14 LAB — URINALYSIS, ROUTINE W REFLEX MICROSCOPIC
Bilirubin Urine: NEGATIVE
Glucose, UA: NEGATIVE mg/dL
Hgb urine dipstick: NEGATIVE
Ketones, ur: 15 mg/dL — AB
Nitrite: NEGATIVE
Protein, ur: NEGATIVE mg/dL
Specific Gravity, Urine: 1.01 (ref 1.005–1.030)
Urobilinogen, UA: 0.2 mg/dL (ref 0.0–1.0)
pH: 5 (ref 5.0–8.0)

## 2014-08-14 LAB — URINE MICROSCOPIC-ADD ON

## 2014-08-14 LAB — I-STAT CHEM 8, ED
BUN: 11 mg/dL (ref 6–23)
Calcium, Ion: 1.21 mmol/L (ref 1.12–1.23)
Chloride: 103 mEq/L (ref 96–112)
Creatinine, Ser: 0.7 mg/dL (ref 0.50–1.10)
Glucose, Bld: 87 mg/dL (ref 70–99)
HCT: 42 % (ref 36.0–46.0)
Hemoglobin: 14.3 g/dL (ref 12.0–15.0)
Potassium: 3.7 mEq/L (ref 3.7–5.3)
Sodium: 140 mEq/L (ref 137–147)
TCO2: 20 mmol/L (ref 0–100)

## 2014-08-14 LAB — PREGNANCY, URINE: Preg Test, Ur: NEGATIVE

## 2014-08-14 MED ORDER — LORAZEPAM 1 MG PO TABS: 1.0000 mg | ORAL_TABLET | Freq: Four times a day (QID) | ORAL | Status: DC | PRN

## 2014-08-14 MED ORDER — LORAZEPAM 1 MG PO TABS
1.0000 mg | ORAL_TABLET | Freq: Once | ORAL | Status: AC
  Administered 2014-08-14: 1 mg via ORAL
  Filled 2014-08-14: qty 1

## 2014-08-14 NOTE — ED Notes (Signed)
Pt states she was ironing clothes at home. Started feeling a fast heartbeat and felt like she was going to lose consciousness.

## 2014-08-14 NOTE — Discharge Instructions (Signed)
As discussed, your evaluation today has been largely reassuring.  But, it is important that you monitor your condition carefully, and do not hesitate to return to the ED if you develop new, or concerning changes in your condition.  Please drink plenty of fluids, get plenty of rest.  Otherwise, please follow-up with your physician for appropriate ongoing care.  Please consider discussing follow up with endocrinology with your primary care physician.

## 2014-08-14 NOTE — ED Notes (Signed)
Patient states fast heart rate tonight. Patient state she has had this before and was followed up by a cardiologist. Patient states diagnosis was palpitations. Patient states she has a history of anxiety and denies feeling like she was having an anxiety attack tonight. A&OX4 at this time. No complaints.

## 2014-08-14 NOTE — Telephone Encounter (Signed)
New message          C/o heart racing when she eats / pt would like a call back from [place]

## 2014-08-14 NOTE — ED Notes (Signed)
Patient ambulatory to restroom to collect urine sample. Steady gait, no deficits noted.

## 2014-08-14 NOTE — ED Notes (Signed)
Patient verbalizes understanding of discharge instructions, prescription medications, home care, and follow up care. Patient ambulatory out of department at this time with family 

## 2014-08-14 NOTE — Telephone Encounter (Signed)
Returned call to patient she stated she has been having fast heart beat off and on since last Thursday.Stated no sob.No chest pain. No fast heart beat at present.Stated she would like a appointment.Dr.Crensahw's schedule is full will send message to Deliah Goodyebra Mathis RN Dr.Crenshaw's nurse.Advised to avoid caffeine and decongestants.

## 2014-08-14 NOTE — ED Provider Notes (Signed)
CSN: [id number]     Arrival date & time 08/14/14  1901 History  This chart was scribe for Gerhard Munchobert Paula Claude, MD by Angelene GiovanniEmmanuella Mensah, ED Scribe. The patient was seen in room APA01/APA01 and the patient's care was started at 7:59 PM.     Chief Complaint  Patient presents with  . Anxiety   The history is provided by the patient. No language interpreter was used.   HPI Comments: Paula Riley is a 33 y.o. female who presents to the Emergency Department complaining of abdominal pain onset 4 days ago accompanied by a pulsation sensation in her abdomen onset this morning. She explains that her abdominal pain feels "full". She denies N/V/D. She reports that she is near syncope. She reports an anxious shaking. She denies dizziness and lightheadedness. She also reports mild swelling in her right thigh. She denies smoking and alcohol use.    Past Medical History  Diagnosis Date  . GERD (gastroesophageal reflux disease)   . Palpitations   . Heart murmur    Past Surgical History  Procedure Laterality Date  . Dental surgery     Family History  Problem Relation Age of Onset  . Hypertension Other   . CAD Other   . Diabetes Other    History  Substance Use Topics  . Smoking status: Never Smoker   . Smokeless tobacco: Never Used  . Alcohol Use: No   OB History    Gravida Para Term Preterm AB TAB SAB Ectopic Multiple Living   3 2 2  1     2      Review of Systems  Constitutional: Negative for fever.       Per HPI, otherwise negative  HENT:       Per HPI, otherwise negative  Respiratory:       Per HPI, otherwise negative  Cardiovascular: Positive for palpitations.       Per HPI, otherwise negative  Gastrointestinal: Positive for abdominal pain. Negative for nausea, vomiting and diarrhea.  Endocrine:       Negative aside from HPI  Genitourinary:       Neg aside from HPI   Musculoskeletal: Positive for joint swelling (Right thigh).       Per HPI, otherwise negative  Skin: Negative.    Neurological: Negative for dizziness, syncope and light-headedness.      Allergies  Review of patient's allergies indicates no known allergies.  Home Medications   Prior to Admission medications   Medication Sig Start Date End Date Taking? Authorizing Provider  acetaminophen (TYLENOL) 500 MG tablet Take 1,000 mg by mouth every 6 (six) hours as needed. For pain    Yes Historical Provider, MD  medroxyPROGESTERone (DEPO-PROVERA) 150 MG/ML injection BRING TO DOCTOR'S OFFICE AS DIRECTED EVERY 3 MONTHS   Yes Adline PotterJennifer A Griffin, NP   BP 120/102 mmHg  Pulse 89  Temp(Src) 98.4 F (36.9 C) (Oral)  Resp 20  Ht 5\' 5"  (1.651 m)  Wt 135 lb (61.236 kg)  BMI 22.47 kg/m2  SpO2 100% Physical Exam  Constitutional: She is oriented to person, place, and time. She appears well-developed and well-nourished. No distress.  HENT:  Head: Normocephalic and atraumatic.  Eyes: Conjunctivae and EOM are normal.  Cardiovascular: Normal rate, regular rhythm and normal heart sounds.   Pulmonary/Chest: Effort normal and breath sounds normal. No stridor. No respiratory distress.  Abdominal: She exhibits no distension. There is tenderness.  Tenderness in the epigastric  Musculoskeletal: She exhibits no edema.  Neurological: She is alert  and oriented to person, place, and time. No cranial nerve deficit.  Skin: Skin is warm and dry.  Psychiatric: She has a normal mood and affect.  Nursing note and vitals reviewed.   ED Course  Procedures (including critical care time) DIAGNOSTIC STUDIES: Oxygen Saturation is 100% on RA, normal by my interpretation.    COORDINATION OF CARE: 8:04 PM- Pt advised of plan for treatment and pt agrees.    Labs Review Labs Reviewed  URINALYSIS, ROUTINE W REFLEX MICROSCOPIC - Abnormal; Notable for the following:    Ketones, ur 15 (*)    Leukocytes, UA TRACE (*)    All other components within normal limits  URINE MICROSCOPIC-ADD ON - Abnormal; Notable for the following:     Squamous Epithelial / LPF MANY (*)    All other components within normal limits  PREGNANCY, URINE  I-STAT CHEM 8, ED     EKG Interpretation   Date/Time:  Monday August 14 2014 19:02:04 EST Ventricular Rate:  110 PR Interval:  145 QRS Duration: 69 QT Interval:  328 QTC Calculation: 444 R Axis:   73 Text Interpretation:  Sinus tachycardia Sinus tachycardia No significant  change since last tracing Abnormal ekg Confirmed by Gerhard MunchLOCKWOOD, Cherylann Hobday  MD  [phone]([id number]) on 08/14/2014 9:48:53 PM     9:49 PM Patient awake and alert, appropriately interactive. Heart rate 95, though it increases to greater than 1 [name] is a start talking to the patient. Patient states that she has seen her cardiologist, had previous cardiac monitoring. Patient will follow up with cardiologist tomorrow, as well as primary care.  We discussed further evaluation, management in the ED, with IV fluids versus home management.  MDM  Patient presents with concern of palpitations, anxiety. Patient is awake, alert, has no risk factors for ACS. Patient is generally well-appearing.  When not being interacted with, the patient has no tachycardia, is in no distress. Patient's heart rate increases with physician interaction. Patient's labs are notable for only mild electrolyte abnormalities. Given January reassuring findings, the patient's history of anxiety, as well as tachycardia, patient was discharged in stable condition to follow-up with primary care, cardiology, and possibly endocrinology if further evaluation, management is warranted.  I personally performed the services described in this documentation, which was scribed in my presence. The recorded information has been reviewed and is accurate.    Gerhard Munchobert Orissa Arreaga, MD 08/14/14 2150

## 2014-08-15 NOTE — Telephone Encounter (Signed)
Spoke with pt, she will see laura ingold np on 08-28-14. She will call prior to appt if needed.

## 2014-08-18 ENCOUNTER — Emergency Department (HOSPITAL_COMMUNITY): Payer: 59

## 2014-08-18 ENCOUNTER — Emergency Department (HOSPITAL_COMMUNITY)
Admission: EM | Admit: 2014-08-18 | Discharge: 2014-08-18 | Disposition: A | Discharge status: Home / Self Care | Payer: 59 | Attending: Emergency Medicine | Admitting: Emergency Medicine

## 2014-08-18 ENCOUNTER — Encounter (HOSPITAL_COMMUNITY): Payer: Self-pay | Admitting: *Deleted

## 2014-08-18 ENCOUNTER — Other Ambulatory Visit: Payer: Self-pay

## 2014-08-18 DIAGNOSIS — K219 Gastro-esophageal reflux disease without esophagitis: Secondary | ICD-10-CM | POA: Diagnosis not present

## 2014-08-18 DIAGNOSIS — Z79899 Other long term (current) drug therapy: Secondary | ICD-10-CM | POA: Diagnosis not present

## 2014-08-18 DIAGNOSIS — R109 Unspecified abdominal pain: Secondary | ICD-10-CM

## 2014-08-18 DIAGNOSIS — R011 Cardiac murmur, unspecified: Secondary | ICD-10-CM | POA: Diagnosis not present

## 2014-08-18 DIAGNOSIS — R55 Syncope and collapse: Secondary | ICD-10-CM | POA: Diagnosis not present

## 2014-08-18 DIAGNOSIS — R002 Palpitations: Secondary | ICD-10-CM | POA: Diagnosis not present

## 2014-08-18 DIAGNOSIS — F419 Anxiety disorder, unspecified: Secondary | ICD-10-CM | POA: Insufficient documentation

## 2014-08-18 HISTORY — DX: Personal history of other medical treatment: Z92.89

## 2014-08-18 HISTORY — DX: Anxiety disorder, unspecified: F41.9

## 2014-08-18 HISTORY — DX: Other specified postprocedural states: Z98.890

## 2014-08-18 LAB — CBC WITH DIFFERENTIAL/PLATELET
Basophils Absolute: 0 10*3/uL (ref 0.0–0.1)
Basophils Relative: 0 % (ref 0–1)
Eosinophils Absolute: 0 10*3/uL (ref 0.0–0.7)
Eosinophils Relative: 0 % (ref 0–5)
HCT: 43.7 % (ref 36.0–46.0)
Hemoglobin: 14.9 g/dL (ref 12.0–15.0)
Lymphocytes Relative: 18 % (ref 12–46)
Lymphs Abs: 0.9 10*3/uL (ref 0.7–4.0)
MCH: 31.1 pg (ref 26.0–34.0)
MCHC: 34.1 g/dL (ref 30.0–36.0)
MCV: 91.2 fL (ref 78.0–100.0)
Monocytes Absolute: 0.3 10*3/uL (ref 0.1–1.0)
Monocytes Relative: 5 % (ref 3–12)
Neutro Abs: 3.8 10*3/uL (ref 1.7–7.7)
Neutrophils Relative %: 77 % (ref 43–77)
Platelets: 274 10*3/uL (ref 150–400)
RBC: 4.79 MIL/uL (ref 3.87–5.11)
RDW: 12.2 % (ref 11.5–15.5)
WBC: 5 10*3/uL (ref 4.0–10.5)

## 2014-08-18 LAB — COMPREHENSIVE METABOLIC PANEL
ALT: 14 U/L (ref 0–35)
AST: 20 U/L (ref 0–37)
Albumin: 4.6 g/dL (ref 3.5–5.2)
Alkaline Phosphatase: 43 U/L (ref 39–117)
Anion gap: 16 — ABNORMAL HIGH (ref 5–15)
BUN: 7 mg/dL (ref 6–23)
CO2: 23 mEq/L (ref 19–32)
Calcium: 9.6 mg/dL (ref 8.4–10.5)
Chloride: 99 mEq/L (ref 96–112)
Creatinine, Ser: 0.72 mg/dL (ref 0.50–1.10)
GFR calc Af Amer: >90 mL/min (ref >90)
GFR calc non Af Amer: >90 mL/min (ref >90)
Glucose, Bld: 96 mg/dL (ref 70–99)
Potassium: 3.6 mEq/L — ABNORMAL LOW (ref 3.7–5.3)
Sodium: 138 mEq/L (ref 137–147)
Total Bilirubin: 0.5 mg/dL (ref 0.3–1.2)
Total Protein: 8.3 g/dL (ref 6.0–8.3)

## 2014-08-18 LAB — URINALYSIS, ROUTINE W REFLEX MICROSCOPIC
Glucose, UA: NEGATIVE mg/dL
Hgb urine dipstick: NEGATIVE
Ketones, ur: 40 mg/dL — AB
Nitrite: NEGATIVE
Protein, ur: NEGATIVE mg/dL
Specific Gravity, Urine: 1.02 (ref 1.005–1.030)
Urobilinogen, UA: 0.2 mg/dL (ref 0.0–1.0)
pH: 6 (ref 5.0–8.0)

## 2014-08-18 LAB — TROPONIN I: Troponin I: <0.3 ng/mL (ref <0.30)

## 2014-08-18 LAB — LIPASE, BLOOD: Lipase: 38 U/L (ref 11–59)

## 2014-08-18 LAB — URINE MICROSCOPIC-ADD ON

## 2014-08-18 LAB — PREGNANCY, URINE: Preg Test, Ur: NEGATIVE

## 2014-08-18 MED ORDER — LORAZEPAM 1 MG PO TABS: 1.0000 mg | ORAL_TABLET | Freq: Two times a day (BID) | ORAL | Status: DC | PRN

## 2014-08-18 MED ORDER — ONDANSETRON 8 MG PO TBDP
8.0000 mg | ORAL_TABLET | Freq: Once | ORAL | Status: AC
  Administered 2014-08-18: 8 mg via ORAL

## 2014-08-18 MED ORDER — ONDANSETRON HCL 4 MG PO TABS: 4.0000 mg | ORAL_TABLET | Freq: Three times a day (TID) | ORAL | Status: DC | PRN

## 2014-08-18 MED ORDER — LORAZEPAM 1 MG PO TABS
1.0000 mg | ORAL_TABLET | Freq: Once | ORAL | Status: AC
  Administered 2014-08-18: 1 mg via ORAL

## 2014-08-18 NOTE — ED Notes (Signed)
Given water and ginger ale at bedside.

## 2014-08-18 NOTE — Discharge Instructions (Signed)
°Emergency Department Resource Guide °1) Find a Doctor and Pay Out of Pocket °Although you won't have to find out who is covered by your insurance plan, it is a good idea to ask around and get recommendations. You will then need to call the office and see if the doctor you have chosen will accept you as a new patient and what types of options they offer for patients who are self-pay. Some doctors offer discounts or will set up payment plans for their patients who do not have insurance, but you will need to ask so you aren't surprised when you get to your appointment. ° °2) Contact Your Local Health Department °Not all health departments have doctors that can see patients for sick visits, but many do, so it is worth a call to see if yours does. If you don't know where your local health department is, you can check in your phone book. The CDC also has a tool to help you locate your state's health department, and many state websites also have listings of all of their local health departments. ° °3) Find a Walk-in Clinic °If your illness is not likely to be very severe or complicated, you may want to try a walk in clinic. These are popping up all over the country in pharmacies, drugstores, and shopping centers. They're usually staffed by nurse practitioners or physician assistants that have been trained to treat common illnesses and complaints. They're usually fairly quick and inexpensive. However, if you have serious medical issues or chronic medical problems, these are probably not your best option. ° °No Primary Care Doctor: °- Call Health Connect at  832-8000 - they can help you locate a primary care doctor that  accepts your insurance, provides certain services, etc. °- Physician Referral Service- 1-800-533-3463 ° °Chronic Pain Problems: °Organization         Address  Phone   Notes  °[facility]  ([phone]) [phone] Patients need to be referred by their primary care doctor.  ° °Medication  Assistance: °Organization         Address  Phone   Notes  °[place] [facility] [address] °[place], [place] [address] (336) 641-8030 --Must be a resident of [place] °-- Must have NO insurance coverage whatsoever (no Medicaid/ Medicare, etc.) °-- The pt. MUST have a primary care doctor that directs their care regularly and follows them in the community °  °[facility]  (866) 331-1348   °[employer]  (888) 892-1162   ° °Agencies that provide inexpensive medical care: °Organization         Address  Phone   Notes  °[facility]  (336) 832-8035   °[facility]    (336) 832-7272   °[facility] [address] °[place], [place] [address] (336) 832-4777   °[facility] [address], °[place] (336) 271-4999   °[facility]    (336) 373-0678   °[facility]    (336) 272-1050   °[facility] ° [address], [place] Phone:  (336) 832-4444, Fax:  (336) 832-4440 Hours of Operation:  9 am - 6 pm, M-F.  Also accepts Medicaid/Medicare and self-pay.  °[facility] ° [address], [place] Phone: (336) [phone], Fax: (336) [phone]. Hours of Operation:  8:30 am - 5:30 pm, M-F.  Also accepts Medicaid and self-pay.  °[facility] [place] [address], [place] Phone: (336) 878-6027   °[facility] [address], [place], [place] (336)723-1848, Ext. 123 Mondays & Thursdays: 7-9 AM.  First 15 patients are seen on a first come, first serve basis. °  ° °Medicaid-accepting [place] Providers: ° °Organization         Address  Phone   Notes  °[facility] [address], [place] (336) 641-2100 Also accepts self-pay patients.  °[facility] [address], [place] ° (336) 856-9996   °[facility] [address], [place]  (336) 288-8857   °[facility] [address] [place] [address], [place] (336) 299-7000   °Veita Bland [address], [place]  ° (336) 373-1557 Only accepts [place] Access Medicaid patients after they have their name applied to their card.  ° °Self-Pay (no insurance) in [place]: ° °Organization         Address  Phone   Notes  °Sickle Cell Patients, [facility] [address], [place] (336) 832-1970   °[facility] [address], [place] (336) 832-4400   °[facility] ° [address], [place] (336) 992-4800   °[facility]/Dr. Osei-Bonsu ° [address] [place] [address], [place] or [address], [place] (336) [phone] Phone number for both [place] and [place] locations is the same.  °[facility] [address], [place] (336) 299-0000   °[facility] [address] [place] [address], [place] or [address] (336) 852-7530 °(336) 878-2260   °[facility] [address], [place] (336) 350-1642, phone; (336) 294-5005, fax Sees patients 1st and 3rd Saturday of every month.  Must not qualify for public or private insurance (i.e. Medicaid, Medicare, [place] Health Choice, Veterans' Benefits) • Household income should be no more than 200% of the poverty level •The clinic cannot treat you if you are pregnant or think you are pregnant • Sexually transmitted diseases are not treated at the clinic.  ° ° °Dental Care: °Organization         Address  Phone  Notes  °[place] [facility] [address], [place] (336) 641-6152 Accepts children up to age 21 who are enrolled in Medicaid or [place] Health Choice; pregnant women with a Medicaid card; and children who have applied for Medicaid or [place] Health Choice, but were declined, whose parents can pay a reduced fee at time of service.  °[place]  [facility] [place]  [address], [place] (336) 641-7733 Accepts children up to age 21 who are enrolled in Medicaid or [place] Health Choice; pregnant women with a Medicaid card; and children who have applied for Medicaid or [place] Health Choice, but were declined, whose parents can pay a reduced fee at time of service.  °[facility] ° [address], [place] (336) 641-4533 Patients are seen by appointment only. Walk-ins are not accepted. [facility] will see patients 18 years of age and older. °Monday - Tuesday (8am-5pm) °Most Wednesdays (8:30-5pm) °$30 per visit, cash only  °[facility] ° [address], [place] (336) 641-4533 Patients are seen by appointment only. Walk-ins are not accepted. [facility] will see patients 18 years of age and older. °One   Wednesday Evening (Monthly: Volunteer Based).  $30 per visit, cash only  °[facility]  (919) 537-3737 for adults; Children under age 4, call Graduate Pediatric Dentistry at ([phone]) [phone]. Children aged 4-14, please call (919) [phone] to request a pediatric application. ° Dental services are provided in all areas of dental care including fillings, crowns and bridges, complete and partial dentures, implants, gum treatment, root canals, and extractions. Preventive care is also provided. Treatment is provided to both adults and children. °Patients are selected via a lottery and there is often a waiting list. °  °[facility] [address], °[place] ° (336) 763-8833 [email or web address] °  °[facility] [address], [place], [place] (336)723-1848, Ext. 123 Second and Fourth Thursday of each month, opens at 6:30 AM; Clinic ends at 9 AM.  Patients are seen on a first-come first-served basis, and a limited number are seen during each clinic.  ° °[facility] ° [address], [place], [place] (336) 723-7904    Eligibility Requirements °You must have lived in [place], [place], or [place] counties for at least the last three months. °  You cannot be eligible for state or federal sponsored healthcare insurance, including Veterans Administration, Medicaid, or Medicare. °  You generally cannot be eligible for healthcare insurance through your employer.  °  How to apply: °Eligibility screenings are held every Tuesday and Wednesday afternoon from 1:00 pm until 4:00 pm. You do not need an appointment for the interview!  °[facility] [address], [place], [place] 336-631-2330   °[facility]  336-342-8273   °[facility]  336-703-3100   °[facility]  336-570-6415   ° °Behavioral Health Resources in the Community: °Intensive Outpatient Programs °Organization         Address  Phone  Notes  °[facility] [address], [place], [place] 336-878-6098   °[facility] [address], [place], [place] 336-832-9800   °[facility] [address], [place], [place] ° 336-882-2125   °[facility] [address],  °[place], [place] 1-800-853-5163 or 336-641-4981   °Substance Abuse Resources °Organization         Address  Phone  Notes  °[facility]  336-882-2125   °[facility]  336-784-9470   °[facility]  336-285-9073   °[facility]  336-845-3988   °Residential & Outpatient Substance Abuse Program  1-800-659-3381   °Psychological Services °Organization         Address  Phone  Notes  °[facility]  336- 832-9600   °[facility]  336- 378-7881   °[facility] [address], [place] 1-800-853-5163 or 336-641-4981   ° °Mobile Crisis Teams °Organization         Address  Phone  Notes  °[facility], Mobile Crisis Care Unit  1-877-626-1772   °Assertive °[facility] ° [address]  [place], [place] 336-834-9664   °Sharon DeEsch [address] °[place] 336-554-5454   ° °Self-Help/Support Groups °Organization         Address  Phone             Notes  °[facility]  - variety of support groups  336- 373-1402 Call for more information  °Narcotics Anonymous (NA), [facility] [address], °[place]  2 meetings at this location  ° °  Residential Treatment Programs Organization         Address  Phone  Notes  [facility] [address],    [place]  [phone]   [facility]  [address], [place] [phone], [place], [place] [phone]   [facility] [address], [place] [phone] Admissions: 8am-3pm M-F  [facility] [address]    [place], [place] [phone]   [facility] [address], [place], [place] [phone]   [facility] [address]  [place], [place] [phone]   [facility] Intensive Outpatient [address], [place], [place] [phone]   [facility] ([facility] [address]  [place], [place] [phone] or ([phone]   [facility] [address] [place], [place] [phone] Accepts Medicaid  [facility] [address]  [place] [phone] Substance Abuse/Addiction Treatment   [facility]         Address  Phone  Notes  [facility]  [phone](888) [phone]   Angie FavaJulie Brannon, PhD [address] [place], [place]   [phone](336) [phone] or [phone](336) [phone]   [facility]   [address] [place], [place] [phone](336) [phone]   [facility] [address], [place], [place] [phone](336) [phone] Insurance/Medicaid/sponsorship through [facility]  [facility] [address]                                    [place], [place] [phone]) [phone] Therapy/tele-psych/case    [facility] [address]   [place], [place] [phone]) [phone]    Dr. Lolly MustacheArfeen  [phone]) [phone]   [facility]  [employer] [facility] 1) [address], [place] 2) [address], [place] 3)  [address], [place] [phone]) [phone]  [phone]) [phone]   [facility] (332)144-9311(336) (254)060-1643 or [phone](336) [phone] (After Hours)      Take the prescriptions as directed.  Call your regular medical doctor and your Cardiologist today to schedule a follow up appointment within the next week.  Return to the Emergency Department immediately sooner if worsening.

## 2014-08-18 NOTE — ED Notes (Signed)
Tolerated po intake well

## 2014-08-18 NOTE — ED Notes (Signed)
abd and chest pain and feeling faint for 4  Days.  Nausea ,no vomiting , no diarrhea.

## 2014-08-18 NOTE — ED Provider Notes (Signed)
CSN: [id number]     Arrival date & time 08/18/14  1052 History   First MD Initiated Contact with Patient 08/18/14 1117     Chief Complaint  Patient presents with  . Abdominal Pain  . Near Syncope      HPI Pt was seen at 1130. Per pt, c/o gradual onset and persistence of constant lightheadedness for the past 8 days. Describes the lightheadedness as "I feel like I'm going to faint." Has been associated with palpitations, upper abd "bloating" feeling, and nausea. Pt was evaluated in the ED 4 days ago for same, dx anxiety. Pt states she has an upcoming Cards MD appt for her chronic palpitations. Denies back pain, no cough/SOB, no diarrhea, no fevers, no black or blood in stools or emesis.    Cards: Dr. Jens Somrenshaw Past Medical History  Diagnosis Date  . GERD (gastroesophageal reflux disease)   . Palpitations   . Heart murmur   . Anxiety   . Hx of echocardiogram 06/2010    normal  . History of Holter monitoring 06/2010 and 11/2011    Cardionet montior with sinus tachycardia, PAC's only, no arrhythmias either monitor   Past Surgical History  Procedure Laterality Date  . Dental surgery     Family History  Problem Relation Age of Onset  . Hypertension Other   . CAD Other   . Diabetes Other    History  Substance Use Topics  . Smoking status: Never Smoker   . Smokeless tobacco: Never Used  . Alcohol Use: No   OB History    Gravida Para Term Preterm AB TAB SAB Ectopic Multiple Living   3 2 2  1     2      Review of Systems ROS: Statement: All systems negative except as marked or noted in the HPI; Constitutional: Negative for fever and chills. ; ; Eyes: Negative for eye pain, redness and discharge. ; ; ENMT: Negative for ear pain, hoarseness, nasal congestion, sinus pressure and sore throat. ; ; Cardiovascular: +palpitations. Negative for chest pain, diaphoresis, dyspnea and peripheral edema. ; ; Respiratory: Negative for cough, wheezing and stridor. ; ; Gastrointestinal: +nausea, abd  pain. Negative for vomiting, diarrhea, blood in stool, hematemesis, jaundice and rectal bleeding. . ; ; Genitourinary: Negative for dysuria, flank pain and hematuria. ; ; Musculoskeletal: Negative for back pain and neck pain. Negative for swelling and trauma.; ; Skin: Negative for pruritus, rash, abrasions, blisters, bruising and skin lesion.; ; Neuro: +lightheadedness. Negative for headache and neck stiffness. Negative for weakness, altered level of consciousness , altered mental status, extremity weakness, paresthesias, involuntary movement, seizure and syncope.     Allergies  Review of patient's allergies indicates no known allergies.  Home Medications   Prior to Admission medications   Medication Sig Start Date End Date Taking? Authorizing Provider  acetaminophen (TYLENOL) 500 MG tablet Take 1,000 mg by mouth every 6 (six) hours as needed. For pain    Yes Historical Provider, MD  medroxyPROGESTERone (DEPO-PROVERA) 150 MG/ML injection BRING TO DOCTOR'S OFFICE AS DIRECTED EVERY 3 MONTHS   Yes Adline PotterJennifer A Griffin, NP  omeprazole (PRILOSEC) 20 MG capsule Take 20 mg by mouth 2 (two) times daily before a meal.   Yes Historical Provider, MD  LORazepam (ATIVAN) 1 MG tablet Take 1 tablet (1 mg total) by mouth every 6 (six) hours as needed for anxiety. Patient not taking: Reported on 08/18/2014 08/14/14   Gerhard Munchobert Lockwood, MD   BP 143/80 mmHg  Pulse 120  Temp(Src)  98.2 F (36.8 C) (Oral)  Resp 20  Ht 5\' 5"  (1.651 m)  Wt 120 lb 7 oz (54.63 kg)  BMI 20.04 kg/m2  SpO2 100%  BP 134/88 mmHg  Pulse 108  Temp(Src) 98.2 F (36.8 C) (Oral)  Resp 19  Ht 5\' 5"  (1.651 m)  Wt 120 lb 7 oz (54.63 kg)  BMI 20.04 kg/m2  SpO2 100%    Filed Vitals:   08/18/14 1230 08/18/14 1245 08/18/14 1300 08/18/14 1315  BP: 101/87  134/88   Pulse:  98 109 111  Temp:      TempSrc:      Resp: 27 16 21 22   Height:      Weight:      SpO2:  100% 100% 100%    Physical Exam  1135: Physical examination:  Nursing  notes reviewed; Vital signs and O2 SAT reviewed;  Constitutional: Well developed, Well nourished, Well hydrated, In no acute distress; Head:  Normocephalic, atraumatic; Eyes: EOMI, PERRL, No scleral icterus; ENMT: Mouth and pharynx normal, Mucous membranes moist; Neck: Supple, Full range of motion, No lymphadenopathy; Cardiovascular: Tachycardic rate and rhythm, No gallop; Respiratory: Breath sounds clear & equal bilaterally, No wheezes.  Speaking full sentences with ease, Normal respiratory effort/excursion; Chest: Nontender, Movement normal; Abdomen: Soft, +mild LUQ tenderness to palp. No rebound or guarding. Nondistended, Normal bowel sounds; Genitourinary: No CVA tenderness; Extremities: Pulses normal, No tenderness, No edema, No calf edema or asymmetry.; Neuro: AA&Ox3, Major CN grossly intact.  Speech clear. No gross focal motor or sensory deficits in extremities.; Skin: Color normal, Warm, Dry.; Psych:  Anxious.     ED Course  Procedures     EKG Interpretation None      MDM  MDM Reviewed: previous chart, nursing note and vitals Reviewed previous: labs and ECG Interpretation: labs, ECG, ultrasound and x-ray      Date: 08/18/2014  Rate: 126  Rhythm: sinus tachycardia  QRS Axis: normal  Intervals: normal  ST/T Wave abnormalities: normal  Conduction Disutrbances:none  Narrative Interpretation:   Old EKG Reviewed: unchanged; no significant changes from previous EKG dated 08/14/2014.   Results for orders placed or performed during the hospital encounter of 08/18/14  Comprehensive metabolic panel  Result Value Ref Range   Sodium 138 137 - 147 mEq/L   Potassium 3.6 (L) 3.7 - 5.3 mEq/L   Chloride 99 96 - 112 mEq/L   CO2 23 19 - 32 mEq/L   Glucose, Bld 96 70 - 99 mg/dL   BUN 7 6 - 23 mg/dL   Creatinine, Ser 1.61 0.50 - 1.10 mg/dL   Calcium 9.6 8.4 - 09.6 mg/dL   Total Protein 8.3 6.0 - 8.3 g/dL   Albumin 4.6 3.5 - 5.2 g/dL   AST 20 0 - 37 U/L   ALT 14 0 - 35 U/L   Alkaline  Phosphatase 43 39 - 117 U/L   Total Bilirubin 0.5 0.3 - 1.2 mg/dL   GFR calc non Af Amer >90 >90 mL/min   GFR calc Af Amer >90 >90 mL/min   Anion gap 16 (H) 5 - 15  Troponin I  Result Value Ref Range   Troponin I <0.30 <0.30 ng/mL  CBC with Differential  Result Value Ref Range   WBC 5.0 4.0 - 10.5 K/uL   RBC 4.79 3.87 - 5.11 MIL/uL   Hemoglobin 14.9 12.0 - 15.0 g/dL   HCT 04.5 40.9 - 81.1 %   MCV 91.2 78.0 - 100.0 fL   MCH 31.1 26.0 -  34.0 pg   MCHC 34.1 30.0 - 36.0 g/dL   RDW 45.412.2 09.811.5 - 11.915.5 %   Platelets 274 150 - 400 K/uL   Neutrophils Relative % 77 43 - 77 %   Neutro Abs 3.8 1.7 - 7.7 K/uL   Lymphocytes Relative 18 12 - 46 %   Lymphs Abs 0.9 0.7 - 4.0 K/uL   Monocytes Relative 5 3 - 12 %   Monocytes Absolute 0.3 0.1 - 1.0 K/uL   Eosinophils Relative 0 0 - 5 %   Eosinophils Absolute 0.0 0.0 - 0.7 K/uL   Basophils Relative 0 0 - 1 %   Basophils Absolute 0.0 0.0 - 0.1 K/uL  Lipase, blood  Result Value Ref Range   Lipase 38 11 - 59 U/L  Pregnancy, urine  Result Value Ref Range   Preg Test, Ur NEGATIVE NEGATIVE  Urinalysis, Routine w reflex microscopic  Result Value Ref Range   Color, Urine YELLOW YELLOW   APPearance CLEAR CLEAR   Specific Gravity, Urine 1.020 1.005 - 1.030   pH 6.0 5.0 - 8.0   Glucose, UA NEGATIVE NEGATIVE mg/dL   Hgb urine dipstick NEGATIVE NEGATIVE   Bilirubin Urine SMALL (A) NEGATIVE   Ketones, ur 40 (A) NEGATIVE mg/dL   Protein, ur NEGATIVE NEGATIVE mg/dL   Urobilinogen, UA 0.2 0.0 - 1.0 mg/dL   Nitrite NEGATIVE NEGATIVE   Leukocytes, UA TRACE (A) NEGATIVE  Urine microscopic-add on  Result Value Ref Range   Squamous Epithelial / LPF FEW (A) RARE   WBC, UA 0-2 <3 WBC/hpf   RBC / HPF 0-2 <3 RBC/hpf   Dg Chest 2 View 08/18/2014   CLINICAL DATA:  Chest tightness and upper abdominal pain for 1 week  EXAM: CHEST  2 VIEW  COMPARISON:  06/18/2011  FINDINGS: The heart size and mediastinal contours are within normal limits. Both lungs are clear. The  visualized skeletal structures are unremarkable.  IMPRESSION: No active cardiopulmonary disease.   Electronically Signed   By: Elige KoHetal  Patel   On: 08/18/2014 12:07   [place] Abdomen Complete 08/18/2014   CLINICAL DATA:  Four days of abdominal and chest pain nausea and lightheadedness ; history of gastroesophageal reflux disease  EXAM: ULTRASOUND ABDOMEN COMPLETE  COMPARISON:  Abdominal ultrasound of October 23, 2011  FINDINGS: Gallbladder: No gallstones or wall thickening visualized. No sonographic Murphy sign noted.  Common bile duct: Diameter: 2.7 mm  Liver: No focal lesion identified. Within normal limits in parenchymal echogenicity.  IVC: No abnormality visualized.  Pancreas: Evaluation was limited by bowel gas.  Spleen: Size and appearance within normal limits.  Right Kidney: Length: 10.4 cm. Echogenicity within normal limits. No mass or hydronephrosis visualized.  Left Kidney: Length: 10 cm. Echogenicity within normal limits. No mass or hydronephrosis visualized.  Abdominal aorta: No aneurysm visualized.  Other findings: Increased bowel gas throughout much of the abdomen.  IMPRESSION: Normal abdominal ultrasound examination. Evaluation of the pancreas was limited due to bowel gas.   Electronically Signed   By: David  [place]   On: 08/18/2014 13:18    1410:  Pt is not orthostatic on VS. When ED staff, as well as pt's young child and her family, are not in her room, pt's HR decreases into 90's. Pt's HR immediately starts to increase when her young child, family and ED staff enter the room and start to talk to her. During my re-eval, pt's HR started at 90's and increased to 120's by the end of the conversation. Pt has  been able to tol PO well while in the ED without N/V. No stooling while in the ED. Abd benign, BP stable. Doubt PE as cause for symptoms with low risk Wells. Pt states she wants to go home now. Dx and testing d/w pt and family.  Questions answered.  Verb understanding, agreeable to d/c home with outpt  f/u.   Samuel Jester, DO 08/20/14 1318

## 2014-08-25 ENCOUNTER — Ambulatory Visit (INDEPENDENT_AMBULATORY_CARE_PROVIDER_SITE_OTHER): Payer: 59 | Admitting: Internal Medicine

## 2014-08-25 ENCOUNTER — Encounter (INDEPENDENT_AMBULATORY_CARE_PROVIDER_SITE_OTHER): Payer: Self-pay | Admitting: Internal Medicine

## 2014-08-25 VITALS — BP 118/62 | HR 84 | Temp 97.8°F | Ht 65.0 in | Wt 119.0 lb

## 2014-08-25 DIAGNOSIS — K219 Gastro-esophageal reflux disease without esophagitis: Secondary | ICD-10-CM

## 2014-08-25 MED ORDER — OMEPRAZOLE 40 MG PO CPDR: 40.0000 mg | DELAYED_RELEASE_CAPSULE | Freq: Every day | ORAL | Status: DC

## 2014-08-25 NOTE — Patient Instructions (Addendum)
OV in 4 weeks.  

## 2014-08-25 NOTE — Progress Notes (Signed)
Subjective:    Patient ID: Paula Riley, female    DOB: 11/15/1980, 33 y.o.   MRN: [id number]  HPI Here today with c/o of fullness to her epigastric region. She says she has discomfort and tightness. She says she does feel nauseated at times. She has had symptoms since after Thanksgiving. She tells me she has lost about  10-15 pounds over the past 6 months.  She says she is has acid reflux on a daily basis. Spicy foods bother her. She usually has a BM every other day. No melena or BRRB.  Recently seen in the ED for a fast heart rate. Has appt with [facility] Monday of next week for fast heart rate. Recently seen in ED for fast heart rate, and bloating.  08/18/2014 US abdomen; abdominal and chest pain.  IMPRESSION: Normal abdominal ultrasound examination. Evaluation of the pancreas was limited due to bowel gas.   08/14/2014 Urine pregnancy negative CMP Latest Ref Rng 08/18/2014 08/14/2014 12/25/2012  Glucose 70 - 99 mg/dL 96 87 409(W106(H)  BUN 6 - 23 mg/dL 7 11 16   Creatinine 0.50 - 1.10 mg/dL 1.190.72 1.470.70 8.290.71  Sodium 137 - 147 mEq/L 138 140 140  Potassium 3.7 - 5.3 mEq/L 3.6(L) 3.7 3.8  Chloride 96 - 112 mEq/L 99 103 106  CO2 19 - 32 mEq/L 23 - 25  Calcium 8.4 - 10.5 mg/dL 9.6 - 9.1  Total Protein 6.0 - 8.3 g/dL 8.3 - 6.5  Total Bilirubin 0.3 - 1.2 mg/dL 0.5 - 5.6(O0.2(L)  Alkaline Phos 39 - 117 U/L 43 - 40  AST 0 - 37 U/L 20 - 20  ALT 0 - 35 U/L 14 - 15   CBC    Component Value Date/Time   WBC 5.0 08/18/2014 1133   RBC 4.79 08/18/2014 1133   HGB 14.9 08/18/2014 1133   HCT 43.7 08/18/2014 1133   PLT 274 08/18/2014 1133   MCV 91.2 08/18/2014 1133   MCH 31.1 08/18/2014 1133   MCHC 34.1 08/18/2014 1133   RDW 12.2 08/18/2014 1133   LYMPHSABS 0.9 08/18/2014 1133   MONOABS 0.3 08/18/2014 1133   EOSABS 0.0 08/18/2014 1133   BASOSABS 0.0 08/18/2014 1133    Lipase     Component Value Date/Time   LIPASE 38 08/18/2014 1133   Urinalysis    Component Value Date/Time   COLORURINE YELLOW 08/18/2014 1136   APPEARANCEUR CLEAR 08/18/2014 1136   LABSPEC 1.020 08/18/2014 1136   PHURINE 6.0 08/18/2014 1136   GLUCOSEU NEGATIVE 08/18/2014 1136   HGBUR NEGATIVE 08/18/2014 1136   BILIRUBINUR SMALL* 08/18/2014 1136   KETONESUR 40* 08/18/2014 1136   PROTEINUR NEGATIVE 08/18/2014 1136   UROBILINOGEN 0.2 08/18/2014 1136   NITRITE NEGATIVE 08/18/2014 1136   LEUKOCYTESUR TRACE* 08/18/2014 1136    \     Review of Systems  Single, 2 children in good health. Works at [employer]. Past Medical History  Diagnosis Date  . GERD (gastroesophageal reflux disease)   . Palpitations   . Heart murmur   . Anxiety   . Hx of echocardiogram 06/2010    normal  . History of Holter monitoring 06/2010 and 11/2011    Cardionet montior with sinus tachycardia, PAC's only, no arrhythmias either monitor    Past Surgical History  Procedure Laterality Date  . Dental surgery      No Known Allergies  Current Outpatient Prescriptions on File Prior to Visit  Medication Sig Dispense Refill  . acetaminophen (TYLENOL) 500 MG tablet Take 1,000 mg  by mouth every 6 (six) hours as needed. For pain     . LORazepam (ATIVAN) 1 MG tablet Take 1 tablet (1 mg total) by mouth every 6 (six) hours as needed for anxiety. 20 tablet 0  . LORazepam (ATIVAN) 1 MG tablet Take 1 tablet (1 mg total) by mouth 2 (two) times daily as needed for anxiety. 10 tablet 0  . medroxyPROGESTERone (DEPO-PROVERA) 150 MG/ML injection BRING TO DOCTOR'S OFFICE AS DIRECTED EVERY 3 MONTHS 1 mL 3  . ondansetron (ZOFRAN) 4 MG tablet Take 1 tablet (4 mg total) by mouth every 8 (eight) hours as needed for nausea or vomiting. 6 tablet 0  . [DISCONTINUED] famotidine (PEPCID) 20 MG tablet Take 1 tablet (20 mg total) by mouth 2 (two) times daily. 30 tablet 0   No current facility-administered medications on file prior to visit.        Objective:   Physical Exam  Filed Vitals:   08/25/14 1011  Height: 5\' 5"  (1.651 m)  Weight:  119 lb (53.978 kg)  Alert and oriented. Skin warm and dry. Oral mucosa is moist.   . Sclera anicteric, conjunctivae is pink. Thyroid not enlarged. No cervical lymphadenopathy. Lungs clear. Heart regular rate and rhythm.  Abdomen is soft. Bowel sounds are positive. No hepatomegaly. No abdominal masses felt. No tenderness.  No edema to lower extremities.   HR up to 112 during exam.          Assessment & Plan:  GERD, Presently not taking any medication. Exam today was normal. Am going to try her on Dexilant. Will see her back in 4 weeks. Dexilant (two boxes with instructions), will talk with Dr Karilyn Cotaehman about a possible EGD if she is not better. Keep appt with Cardiologist. Rx for Prilosec 40mg  after the Dexilant.

## 2014-08-28 ENCOUNTER — Ambulatory Visit (INDEPENDENT_AMBULATORY_CARE_PROVIDER_SITE_OTHER): Payer: 59 | Admitting: Cardiology

## 2014-08-28 ENCOUNTER — Encounter: Payer: Self-pay | Admitting: Cardiology

## 2014-08-28 VITALS — BP 138/86 | HR 127 | Ht 65.0 in | Wt 122.0 lb

## 2014-08-28 DIAGNOSIS — I471 Supraventricular tachycardia: Secondary | ICD-10-CM

## 2014-08-28 DIAGNOSIS — R000 Tachycardia, unspecified: Secondary | ICD-10-CM

## 2014-08-28 DIAGNOSIS — R002 Palpitations: Secondary | ICD-10-CM

## 2014-08-28 MED ORDER — METOPROLOL SUCCINATE ER 25 MG PO TB24: 25.0000 mg | ORAL_TABLET | Freq: Every day | ORAL | Status: DC

## 2014-08-28 NOTE — Assessment & Plan Note (Signed)
Add toprol XL 25 mg every HS.

## 2014-08-28 NOTE — Assessment & Plan Note (Signed)
The toprol may slow HR as well, follow up with Dr. Velta AddisonB. Crenshaw in 3 months.

## 2014-08-28 NOTE — Patient Instructions (Signed)
Start Toprol XL 25 mg daily   Your physician recommends that you schedule a follow-up appointment in: 3 months with Dr.Crenshaw Tue 11/28/14 at 9:30 am.

## 2014-08-28 NOTE — Progress Notes (Signed)
08/28/2014   PCP: Pershing ProudJACKSON,[name], PA-C   Chief Complaint  Patient presents with  . Palpitations    pt denies chest pain, sob, and swelling    Primary Cardiologist:Dr. Velta AddisonB. Crenshaw   HPI :  33 year old female for fu of palpitations. Patient had an echocardiogram in October of 2011. Her ejection fraction was 65-70% and there was grade 1 diastolic dysfunction. There was no significant Doppler abnormality. Previous monitor in October of 2011 showed no significant arrhythmias. When I initially saw her in March of 2013 we scheduled a CardioNet. This revealed sinus to sinus tachycardia with PACs. She has no chest pain or SOB.  No syncope. Recent ER visits with tachycardia.    Denies pregnancy and has no plans for pregnancy.  We discussed BB again, with elevated HR and G1DD, i recommended BB as Dr. Velta AddisonB. Crenshaw has in the past.  Informed it should help with her palpations also.     Reviewed side effects with her as well.  No Known Allergies  Current Outpatient Prescriptions  Medication Sig Dispense Refill  . acetaminophen (TYLENOL) 500 MG tablet Take 1,000 mg by mouth every 6 (six) hours as needed. For pain     . dexlansoprazole (DEXILANT) 60 MG capsule Take 60 mg by mouth daily.    Marland Kitchen. LORazepam (ATIVAN) 1 MG tablet Take 1 tablet (1 mg total) by mouth every 6 (six) hours as needed for anxiety. 20 tablet 0  . medroxyPROGESTERone (DEPO-PROVERA) 150 MG/ML injection BRING TO DOCTOR'S OFFICE AS DIRECTED EVERY 3 MONTHS 1 mL 3  . omeprazole (PRILOSEC) 40 MG capsule Take 1 capsule (40 mg total) by mouth daily. 30 capsule 3  . ondansetron (ZOFRAN) 4 MG tablet Take 1 tablet (4 mg total) by mouth every 8 (eight) hours as needed for nausea or vomiting. 6 tablet 0  . metoprolol succinate (TOPROL XL) 25 MG 24 hr tablet Take 1 tablet (25 mg total) by mouth daily. 30 tablet 6  . [DISCONTINUED] famotidine (PEPCID) 20 MG tablet Take 1 tablet (20 mg total) by mouth 2 (two) times daily. 30  tablet 0   No current facility-administered medications for this visit.    Past Medical History  Diagnosis Date  . GERD (gastroesophageal reflux disease)   . Palpitations   . Heart murmur   . Anxiety   . Hx of echocardiogram 06/2010    normal  . History of Holter monitoring 06/2010 and 11/2011    Cardionet montior with sinus tachycardia, PAC's only, no arrhythmias either monitor    Past Surgical History  Procedure Laterality Date  . Dental surgery      ZOX:WRUEAVW:UJROS:General:no colds or fevers, no weight changes Skin:no rashes or ulcers HEENT:no blurred vision, no congestion CV:see HPI PUL:see HPI GI:no diarrhea constipation or melena, no indigestion GU:no hematuria, no dysuria MS:no joint pain, no claudication Neuro:no syncope, no lightheadedness Endo:no diabetes, no thyroid disease  Wt Readings from Last 3 Encounters:  08/28/14 122 lb (55.339 kg)  08/25/14 119 lb (53.978 kg)  08/18/14 120 lb 7 oz (54.63 kg)    PHYSICAL EXAM BP 138/86 mmHg  Pulse 127  Ht 5\' 5"  (1.651 m)  Wt 122 lb (55.339 kg)  BMI 20.30 kg/m2 General:Pleasant affect, NAD Skin:Warm and dry, brisk capillary refill HEENT:normocephalic, sclera clear, mucus membranes moist Neck:supple, no JVD, no bruits  Heart:S1S2 RRR without murmur, gallup, rub or click Lungs:clear without rales, rhonchi, or wheezes WJX:BJYNAbd:soft, non tender, + BS, do not  palpate liver spleen or masses Ext:no lower ext edema, 2+ pedal pulses, 2+ radial pulses Neuro:alert and oriented, MAE, follows commands, + facial symmetry EKG:ST at 127 no acute changes otherwise.   ASSESSMENT AND PLAN Palpitations Add toprol XL 25 mg every HS.  Sinus tachycardia The toprol may slow HR as well, follow up with Dr. Velta AddisonB. Crenshaw in 3 months.

## 2014-09-27 ENCOUNTER — Encounter (INDEPENDENT_AMBULATORY_CARE_PROVIDER_SITE_OTHER): Payer: Self-pay | Admitting: Internal Medicine

## 2014-09-27 ENCOUNTER — Ambulatory Visit (INDEPENDENT_AMBULATORY_CARE_PROVIDER_SITE_OTHER): Payer: 59 | Admitting: Internal Medicine

## 2014-09-27 VITALS — BP 98/58 | HR 100 | Temp 97.7°F | Ht 65.0 in | Wt 115.4 lb

## 2014-09-27 DIAGNOSIS — K219 Gastro-esophageal reflux disease without esophagitis: Secondary | ICD-10-CM

## 2014-09-27 NOTE — Patient Instructions (Addendum)
OV in 4 weeks. Continue the Prilosec. H. Pylori today.

## 2014-09-27 NOTE — Progress Notes (Addendum)
Subjective:    Patient ID: Paula Riley, female    DOB: 1980-12-04, 34 y.o.   MRN: [id number]  HPIHere today for f/u. Last visit 08/25/2014. She c/o fullness to her epigastric region.  She has acid reflux in the morning. She continues to have some tightness in her epigastric region. She does not have any nausea at this time.  She tells me the Omeprazole is helping with her acid reflux.  She is avoiding spicy foods.  She has lost about 3 1/2 pounds since her last visit in December. She has lost about 18 total. Her appetite is good. Having a BM about every other day. No melena or BRRB. Followed up with [facility] last Monday for a fast heart rate and placed on Lopressor . She denies taking any NSAIDs. She says she is under a lot of stress with her heart (fast heart rate)   08/25/2014 OV 08/18/2014 US abdomen; abdominal and chest pain.  IMPRESSION: Normal abdominal ultrasound examination. Evaluation of the pancreas was limited due to bowel gas.   CBC    Component Value Date/Time   WBC 5.0 08/18/2014 1133   RBC 4.79 08/18/2014 1133   HGB 14.9 08/18/2014 1133   HCT 43.7 08/18/2014 1133   PLT 274 08/18/2014 1133   MCV 91.2 08/18/2014 1133   MCH 31.1 08/18/2014 1133   MCHC 34.1 08/18/2014 1133   RDW 12.2 08/18/2014 1133   LYMPHSABS 0.9 08/18/2014 1133   MONOABS 0.3 08/18/2014 1133   EOSABS 0.0 08/18/2014 1133   BASOSABS 0.0 08/18/2014 1133    CMP     Component Value Date/Time   NA 138 08/18/2014 1133   K 3.6* 08/18/2014 1133   CL 99 08/18/2014 1133   CO2 23 08/18/2014 1133   GLUCOSE 96 08/18/2014 1133   BUN 7 08/18/2014 1133   CREATININE 0.72 08/18/2014 1133   CREATININE 0.71 11/26/2011 1113   CALCIUM 9.6 08/18/2014 1133   PROT 8.3 08/18/2014 1133   ALBUMIN 4.6 08/18/2014 1133   AST 20 08/18/2014 1133   ALT 14 08/18/2014 1133   ALKPHOS 43 08/18/2014 1133   BILITOT 0.5 08/18/2014 1133   GFRNONAA >90 08/18/2014 1133   GFRAA >90 08/18/2014 1133        Review of Systems Past Medical History  Diagnosis Date  . GERD (gastroesophageal reflux disease)   . Palpitations   . Heart murmur   . Anxiety   . Hx of echocardiogram 06/2010    normal  . History of Holter monitoring 06/2010 and 11/2011    Cardionet montior with sinus tachycardia, PAC's only, no arrhythmias either monitor    Past Surgical History  Procedure Laterality Date  . Dental surgery      No Known Allergies  Current Outpatient Prescriptions on File Prior to Visit  Medication Sig Dispense Refill  . acetaminophen (TYLENOL) 500 MG tablet Take 1,000 mg by mouth every 6 (six) hours as needed. For pain     . LORazepam (ATIVAN) 1 MG tablet Take 1 tablet (1 mg total) by mouth every 6 (six) hours as needed for anxiety. 20 tablet 0  . medroxyPROGESTERone (DEPO-PROVERA) 150 MG/ML injection BRING TO DOCTOR'S OFFICE AS DIRECTED EVERY 3 MONTHS 1 mL 3  . metoprolol succinate (TOPROL XL) 25 MG 24 hr tablet Take 1 tablet (25 mg total) by mouth daily. 30 tablet 6  . omeprazole (PRILOSEC) 40 MG capsule Take 1 capsule (40 mg total) by mouth daily. 30 capsule 3  . dexlansoprazole (DEXILANT) 60  MG capsule Take 60 mg by mouth daily.    . ondansetron (ZOFRAN) 4 MG tablet Take 1 tablet (4 mg total) by mouth every 8 (eight) hours as needed for nausea or vomiting. (Patient not taking: Reported on 09/27/2014) 6 tablet 0  . [DISCONTINUED] famotidine (PEPCID) 20 MG tablet Take 1 tablet (20 mg total) by mouth 2 (two) times daily. 30 tablet 0   No current facility-administered medications on file prior to visit.   Single, two children in good health. Works at [employer] in [place].       Objective:   Physical Exam  Filed Vitals:   09/27/14 0852  Height: 5\' 5"  (1.651 m)  Weight: 115 lb 6.4 oz (52.345 kg)   Alert and oriented. Skin warm and dry. Oral mucosa is moist.   . Sclera anicteric, conjunctivae is pink. Thyroid not enlarged. No cervical lymphadenopathy. Lungs clear. Heart regular  rate and rhythm.  Abdomen is soft. Bowel sounds are positive. No hepatomegaly. No abdominal masses felt. No tenderness.  No edema to lower extremities.           Assessment & Plan:  GERD. Seems better at this point. I offered her an EGD and we both decided to wait a month. If symptoms are worse will proceed with an EGD to rule out PUD. She will call if symptoms worsen. H. Pylor today.

## 2014-09-28 LAB — H. PYLORI ANTIBODY, IGG: H Pylori IgG: 0.4 {ISR}

## 2014-10-04 ENCOUNTER — Ambulatory Visit (INDEPENDENT_AMBULATORY_CARE_PROVIDER_SITE_OTHER): Payer: 59 | Admitting: Adult Health

## 2014-10-04 ENCOUNTER — Encounter: Payer: Self-pay | Admitting: Adult Health

## 2014-10-04 DIAGNOSIS — Z308 Encounter for other contraceptive management: Secondary | ICD-10-CM

## 2014-10-04 DIAGNOSIS — Z3042 Encounter for surveillance of injectable contraceptive: Secondary | ICD-10-CM

## 2014-10-04 DIAGNOSIS — Z3202 Encounter for pregnancy test, result negative: Secondary | ICD-10-CM

## 2014-10-04 LAB — POCT URINE PREGNANCY: Preg Test, Ur: NEGATIVE

## 2014-10-04 MED ORDER — MEDROXYPROGESTERONE ACETATE 150 MG/ML IM SUSP
150.0000 mg | Freq: Once | INTRAMUSCULAR | Status: AC
  Administered 2014-10-04: 150 mg via INTRAMUSCULAR

## 2014-10-25 ENCOUNTER — Ambulatory Visit (INDEPENDENT_AMBULATORY_CARE_PROVIDER_SITE_OTHER): Payer: 59 | Admitting: Internal Medicine

## 2014-11-03 ENCOUNTER — Encounter (INDEPENDENT_AMBULATORY_CARE_PROVIDER_SITE_OTHER): Payer: Self-pay | Admitting: Internal Medicine

## 2014-11-03 ENCOUNTER — Ambulatory Visit (INDEPENDENT_AMBULATORY_CARE_PROVIDER_SITE_OTHER): Payer: 59 | Admitting: Internal Medicine

## 2014-11-03 VITALS — BP 112/54 | HR 100 | Temp 98.1°F | Ht 65.0 in | Wt 115.4 lb

## 2014-11-03 DIAGNOSIS — N390 Urinary tract infection, site not specified: Secondary | ICD-10-CM

## 2014-11-03 DIAGNOSIS — K219 Gastro-esophageal reflux disease without esophagitis: Secondary | ICD-10-CM

## 2014-11-03 MED ORDER — CIPROFLOXACIN HCL 500 MG PO TABS: 500.0000 mg | ORAL_TABLET | Freq: Two times a day (BID) | ORAL | Status: DC

## 2014-11-03 NOTE — Progress Notes (Addendum)
Subjective:    Patient ID: Paula Riley, female    DOB: 06-Jun-1981, 34 y.o.   MRN: [id number]  HPI Here today for f/u. Last seen 09/27/2014. Chief c/o fullness to epigastric region.  Weight in January 115. Weight today is 115.4 She says she is better. There is no epigastric pain now. She is taking the Omeprazole daily. She is not belching now. No acid reflux. She is avoiding spicy foods. She feels good. She continues to work full time. She does say she does eat tomotatoes. C/o day of urinary frequency and burning. Symptoms x 2 days. Followed by [facility] for fast heart rate. Has appt in March.  BMs are normal. No melena or BRRB.   CBC    Component Value Date/Time   WBC 5.0 08/18/2014 1133   RBC 4.79 08/18/2014 1133   HGB 14.9 08/18/2014 1133   HCT 43.7 08/18/2014 1133   PLT 274 08/18/2014 1133   MCV 91.2 08/18/2014 1133   MCH 31.1 08/18/2014 1133   MCHC 34.1 08/18/2014 1133   RDW 12.2 08/18/2014 1133   LYMPHSABS 0.9 08/18/2014 1133   MONOABS 0.3 08/18/2014 1133   EOSABS 0.0 08/18/2014 1133   BASOSABS 0.0 08/18/2014 1133    Hepatic Function Panel     Component Value Date/Time   PROT 8.3 08/18/2014 1133   ALBUMIN 4.6 08/18/2014 1133   AST 20 08/18/2014 1133   ALT 14 08/18/2014 1133   ALKPHOS 43 08/18/2014 1133   BILITOT 0.5 08/18/2014 1133   BILIDIR 0.1 10/31/2011 2230   IBILI 0.4 10/31/2011 2230         08/25/2014 OV 08/18/2014 US abdomen; abdominal and chest pain.  IMPRESSION: Normal abdominal ultrasound examination. Evaluation of the pancreas was limited due to bowel gas.        Review of Systems     Past Medical History  Diagnosis Date  . GERD (gastroesophageal reflux disease)   . Palpitations   . Heart murmur   . Anxiety   . Hx of echocardiogram 06/2010    normal  . History of Holter monitoring 06/2010 and 11/2011    Cardionet montior with sinus tachycardia, PAC's only, no arrhythmias either monitor    Past Surgical History    Procedure Laterality Date  . Dental surgery      No Known Allergies  Current Outpatient Prescriptions on File Prior to Visit  Medication Sig Dispense Refill  . acetaminophen (TYLENOL) 500 MG tablet Take 1,000 mg by mouth every 6 (six) hours as needed. For pain     . KLONOPIN 0.5 MG tablet 0.5 mg as needed.     . medroxyPROGESTERone (DEPO-PROVERA) 150 MG/ML injection BRING TO DOCTOR'S OFFICE AS DIRECTED EVERY 3 MONTHS 1 mL 3  . metoprolol succinate (TOPROL XL) 25 MG 24 hr tablet Take 1 tablet (25 mg total) by mouth daily. 30 tablet 6  . omeprazole (PRILOSEC) 40 MG capsule Take 1 capsule (40 mg total) by mouth daily. 30 capsule 3  . [DISCONTINUED] famotidine (PEPCID) 20 MG tablet Take 1 tablet (20 mg total) by mouth 2 (two) times daily. 30 tablet 0   No current facility-administered medications on file prior to visit.     Objective:   Physical Exam Filed Vitals:   11/03/14 0840  Height:  (1.651 m)  Weight: 115 lb 6.4 oz (52.345 kg)   Alert and oriented. Skin warm and dry. Oral mucosa is moist.   . Sclera anicteric, conjunctivae is pink. Thyroid not enlarged. No cervical  lymphadenopathy. Lungs clear. Heart regular rate and rhythm.  Abdomen is soft. Bowel sounds are positive. No hepatomegaly. No abdominal masses felt. No tenderness.  No edema to lower extremities.         Assessment & Plan:  GERD controlled at this time. Feels much better. No GI problems. UTI: will get a urinalysis and start on Cipro 500mg  BID x 7 days.  OV in 6 months.GERD diet given to patient.

## 2014-11-03 NOTE — Patient Instructions (Addendum)
Continue Omeprazole. OV 6 months Cipro 500mg  BID x 7 days

## 2014-11-04 LAB — URINALYSIS
Bilirubin Urine: NEGATIVE
Glucose, UA: NEGATIVE mg/dL
Ketones, ur: NEGATIVE mg/dL
Nitrite: POSITIVE — AB
Protein, ur: NEGATIVE mg/dL
Specific Gravity, Urine: 1.009 (ref 1.005–1.030)
Urobilinogen, UA: 1 mg/dL (ref 0.0–1.0)
pH: 6.5 (ref 5.0–8.0)

## 2014-11-27 NOTE — Progress Notes (Signed)
      HPI: FU palpitations. Patient had an echocardiogram in October of 2011. Her ejection fraction was 65-70% and there was grade 1 diastolic dysfunction. There was no significant Doppler abnormality. Previous monitor in October of 2011 showed no significant arrhythmias. Cardionet 3/13 revealed sinus to sinus tachycardia with PACs. Toprol recently added. Since last seen, she continues to have occasional palpitations described as a skip. They are not sustained. Otherwise denies dyspnea on exertion, orthopnea, PND, pedal edema, syncope or chest pain.  Current Outpatient Prescriptions  Medication Sig Dispense Refill  . acetaminophen (TYLENOL) 500 MG tablet Take 1,000 mg by mouth every 6 (six) hours as needed. For pain     . KLONOPIN 0.5 MG tablet 0.5 mg as needed.     . medroxyPROGESTERone (DEPO-PROVERA) 150 MG/ML injection BRING TO DOCTOR'S OFFICE AS DIRECTED EVERY 3 MONTHS 1 mL 3  . metoprolol succinate (TOPROL XL) 25 MG 24 hr tablet Take 1 tablet (25 mg total) by mouth daily. 30 tablet 6  . omeprazole (PRILOSEC) 40 MG capsule Take 1 capsule (40 mg total) by mouth daily. 30 capsule 3  . [DISCONTINUED] famotidine (PEPCID) 20 MG tablet Take 1 tablet (20 mg total) by mouth 2 (two) times daily. 30 tablet 0   No current facility-administered medications for this visit.     Past Medical History  Diagnosis Date  . GERD (gastroesophageal reflux disease)   . Palpitations   . Heart murmur   . Anxiety   . Hx of echocardiogram 06/2010    normal  . History of Holter monitoring 06/2010 and 11/2011    Cardionet montior with sinus tachycardia, PAC's only, no arrhythmias either monitor    Past Surgical History  Procedure Laterality Date  . Dental surgery      History   Social History  . Marital Status: Single    Spouse Name: N/A  . Number of Children: N/A  . Years of Education: N/A   Occupational History  . Not on file.   Social History Main Topics  . Smoking status: Never Smoker   .  Smokeless tobacco: Never Used  . Alcohol Use: No  . Drug Use: No  . Sexual Activity: Yes    Birth Control/ Protection: Injection   Other Topics Concern  . Not on file   Social History Narrative    ROS: no fevers or chills, productive cough, hemoptysis, dysphasia, odynophagia, melena, hematochezia, dysuria, hematuria, rash, seizure activity, orthopnea, PND, pedal edema, claudication. Remaining systems are negative.  Physical Exam: Well-developed well-nourished in no acute distress.  Skin is warm and dry.  HEENT is normal.  Neck is supple.  Chest is clear to auscultation with normal expansion.  Cardiovascular exam is regular rate and rhythm.  Abdominal exam nontender or distended. No masses palpated. Extremities show no edema. neuro grossly intact  ECG 08/28/2014-sinus tachycardia, nonspecific ST changes.

## 2014-11-28 ENCOUNTER — Ambulatory Visit (INDEPENDENT_AMBULATORY_CARE_PROVIDER_SITE_OTHER): Payer: 59 | Admitting: Cardiology

## 2014-11-28 ENCOUNTER — Encounter: Payer: Self-pay | Admitting: Cardiology

## 2014-11-28 VITALS — BP 136/80 | HR 88 | Ht 65.0 in | Wt 119.1 lb

## 2014-11-28 DIAGNOSIS — R002 Palpitations: Secondary | ICD-10-CM

## 2014-11-28 NOTE — Assessment & Plan Note (Signed)
Patient continues to have occasional palpitations. These make her anxious. The Toprol has not helped. She takes this at night and the majority of her symptoms are during the day at work. I've asked her to take her Toprol in the morning. I will not advance the dose as she states her blood pressure is in the 90s. I will plan to repeat her monitor to make sure there is no significant arrhythmias. We will see her back in 3 months.

## 2014-11-28 NOTE — Patient Instructions (Signed)
Your physician recommends that you schedule a follow-up appointment in: 3 MONTHS WITH DR Jens SomRENSHAW  Your physician has recommended that you wear an event monitor. Event monitors are medical devices that record the heart's electrical activity. Doctors most often us these monitors to diagnose arrhythmias. Arrhythmias are problems with the speed or rhythm of the heartbeat. The monitor is a small, portable device. You can wear one while you do your normal daily activities. This is usually used to diagnose what is causing palpitations/syncope (passing out).SCHEDULE AT THE [address] OFFICE  TAKE METOPROLOL IN THE MORNING

## 2014-11-29 ENCOUNTER — Encounter: Payer: Self-pay | Admitting: *Deleted

## 2014-11-29 NOTE — Progress Notes (Signed)
Patient ID: Paula Riley, female   DOB: 07/09/1981, 34 y.o.   MRN: [id number] Patient did not show up for 11/29/14 4:00PM appointment to have a 30 day cardiac event monitor applied.

## 2014-11-30 ENCOUNTER — Encounter (INDEPENDENT_AMBULATORY_CARE_PROVIDER_SITE_OTHER): Payer: 59

## 2014-11-30 ENCOUNTER — Encounter: Payer: Self-pay | Admitting: Radiology

## 2014-11-30 DIAGNOSIS — R002 Palpitations: Secondary | ICD-10-CM | POA: Diagnosis not present

## 2014-11-30 NOTE — Progress Notes (Signed)
Patient ID: Paula Riley, female   DOB: 02/17/1981, 34 y.o.   MRN: [id number] [employer] 30 day monitor applied. EOS 4-16

## 2014-12-20 ENCOUNTER — Other Ambulatory Visit (HOSPITAL_COMMUNITY): Payer: Self-pay | Admitting: Family Medicine

## 2014-12-20 DIAGNOSIS — N63 Unspecified lump in unspecified breast: Secondary | ICD-10-CM

## 2014-12-26 ENCOUNTER — Other Ambulatory Visit: Payer: Self-pay | Admitting: Adult Health

## 2014-12-27 ENCOUNTER — Encounter: Payer: Self-pay | Admitting: *Deleted

## 2014-12-27 ENCOUNTER — Ambulatory Visit (INDEPENDENT_AMBULATORY_CARE_PROVIDER_SITE_OTHER): Payer: 59 | Admitting: *Deleted

## 2014-12-27 DIAGNOSIS — Z3202 Encounter for pregnancy test, result negative: Secondary | ICD-10-CM

## 2014-12-27 DIAGNOSIS — Z3042 Encounter for surveillance of injectable contraceptive: Secondary | ICD-10-CM

## 2014-12-27 LAB — POCT URINE PREGNANCY: Preg Test, Ur: NEGATIVE

## 2014-12-27 MED ORDER — MEDROXYPROGESTERONE ACETATE 150 MG/ML IM SUSP
150.0000 mg | Freq: Once | INTRAMUSCULAR | Status: AC
  Administered 2014-12-27: 150 mg via INTRAMUSCULAR

## 2014-12-27 NOTE — Progress Notes (Signed)
Pt here for Depo. Reports no problems at this time. Return in 12 weeks for next shot. JSY 

## 2015-01-04 ENCOUNTER — Telehealth: Payer: Self-pay | Admitting: *Deleted

## 2015-01-04 NOTE — Telephone Encounter (Signed)
Monitor reviewed by dr Jens Somcrenshaw shows sinus with pac's Left message for pt to call

## 2015-01-11 NOTE — Telephone Encounter (Signed)
pt aware of results  

## 2015-01-23 ENCOUNTER — Ambulatory Visit (HOSPITAL_COMMUNITY)
Admission: RE | Admit: 2015-01-23 | Discharge: 2015-01-23 | Disposition: A | Discharge status: Home / Self Care | Payer: 59 | Source: Ambulatory Visit | Attending: Family Medicine | Admitting: Family Medicine

## 2015-01-23 ENCOUNTER — Other Ambulatory Visit (HOSPITAL_COMMUNITY): Payer: Self-pay | Admitting: Family Medicine

## 2015-01-23 DIAGNOSIS — N6320 Unspecified lump in the left breast, unspecified quadrant: Secondary | ICD-10-CM

## 2015-01-23 DIAGNOSIS — N63 Unspecified lump in unspecified breast: Secondary | ICD-10-CM

## 2015-01-28 IMAGING — US US ABDOMEN COMPLETE
1 series · 14 of 25 positions shown · non-contrast
Comparison: Abdominal ultrasound October 23, 2011

CLINICAL DATA: Four days of abdominal and chest pain nausea and
lightheadedness ; history of gastroesophageal reflux disease

EXAM:
ULTRASOUND ABDOMEN COMPLETE

[Series 1: us abdomen complete · 0.11mm/px · 14 of 123 slices shown]
[im 1/123]
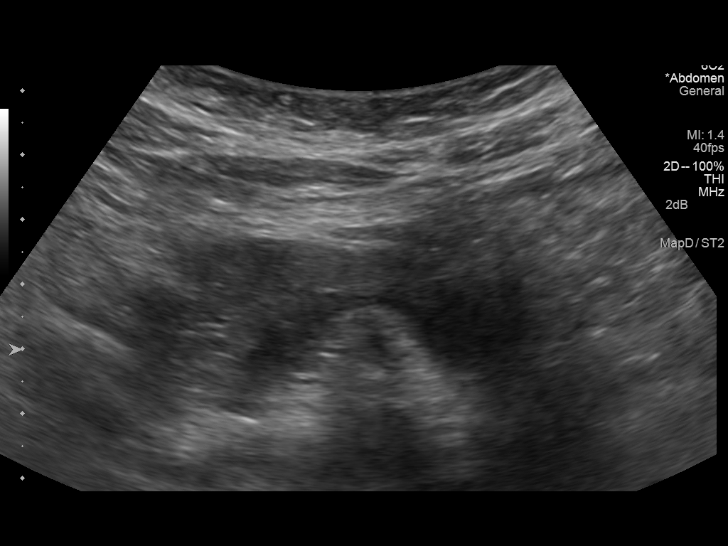
[im 11/123]
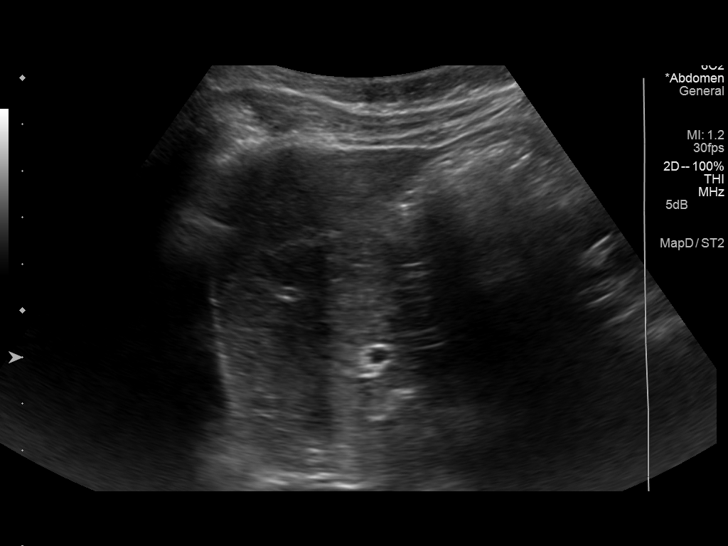
[im 21/123]
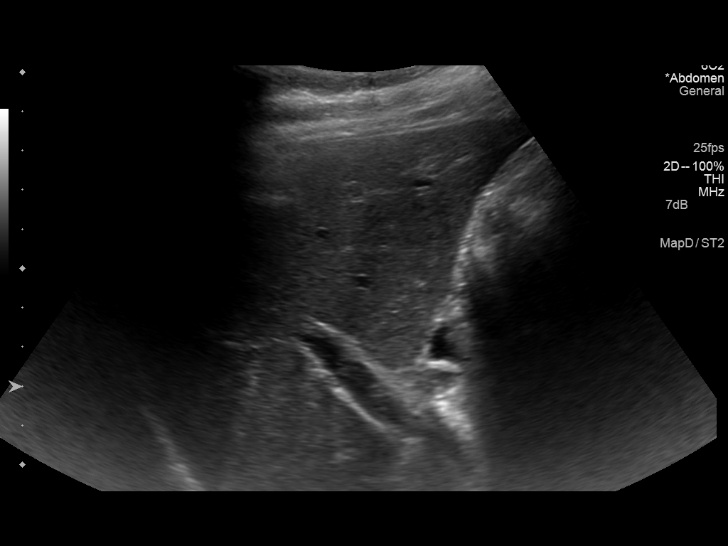
[im 31/123]
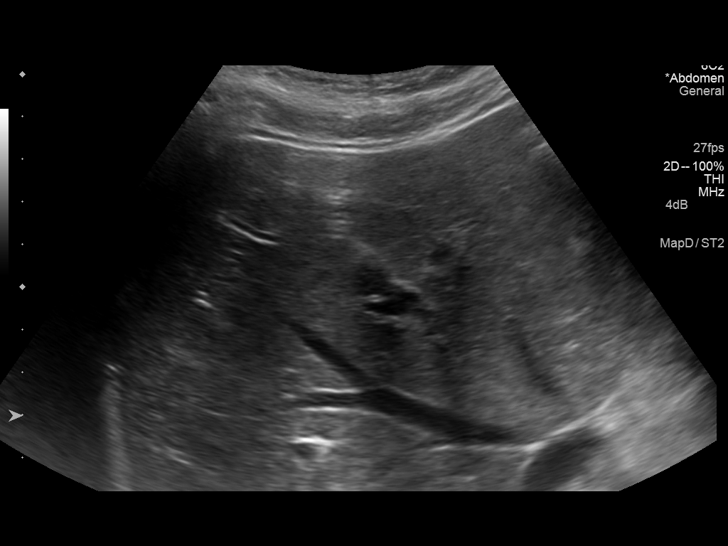
[im 41/123]
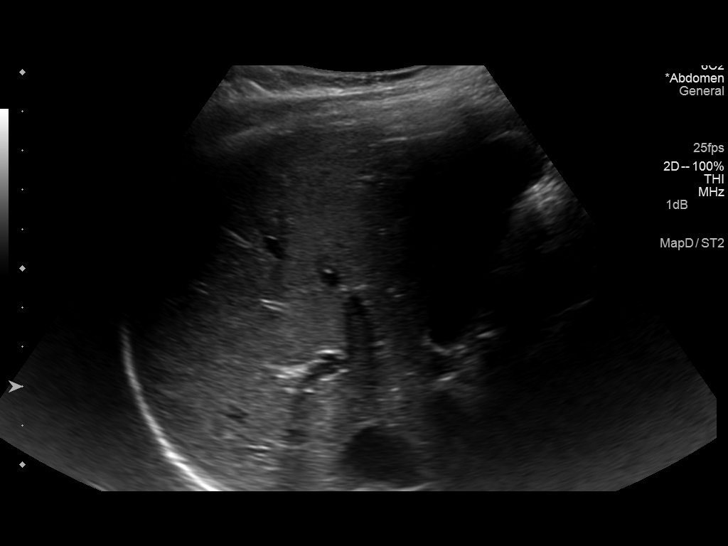
[im 46/123]
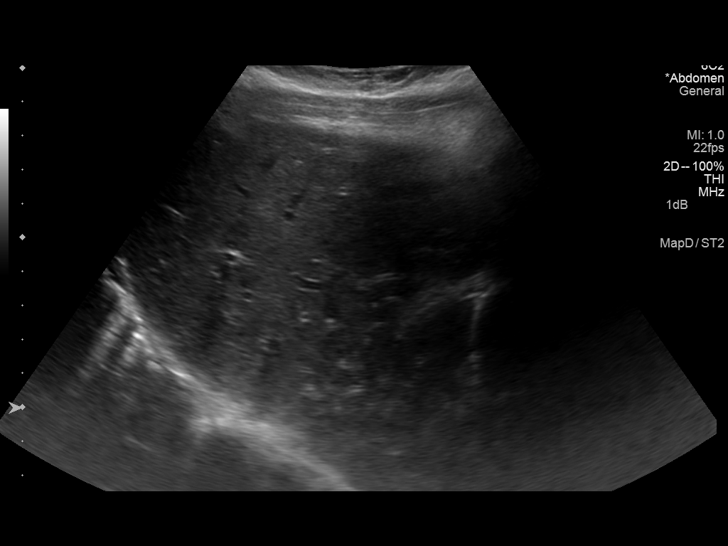
[im 56/123]
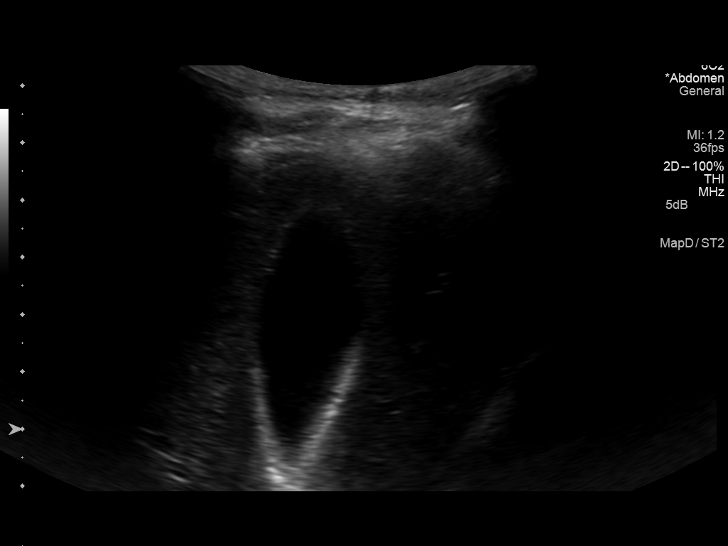
[im 67/123]
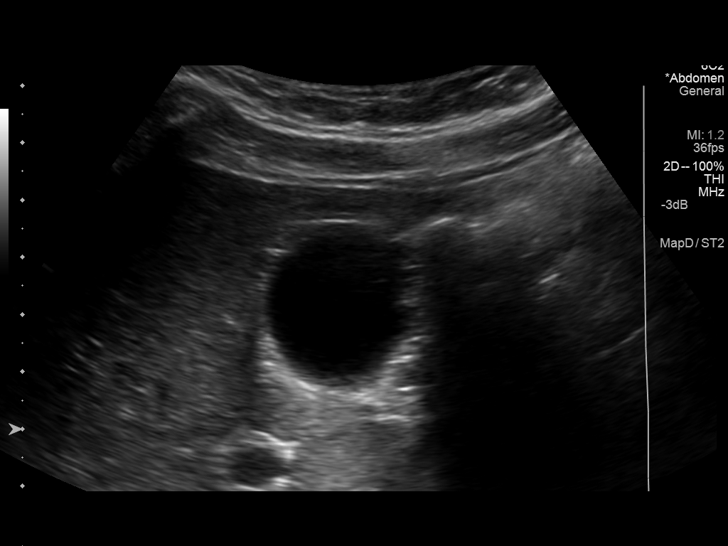
[im 77/123]
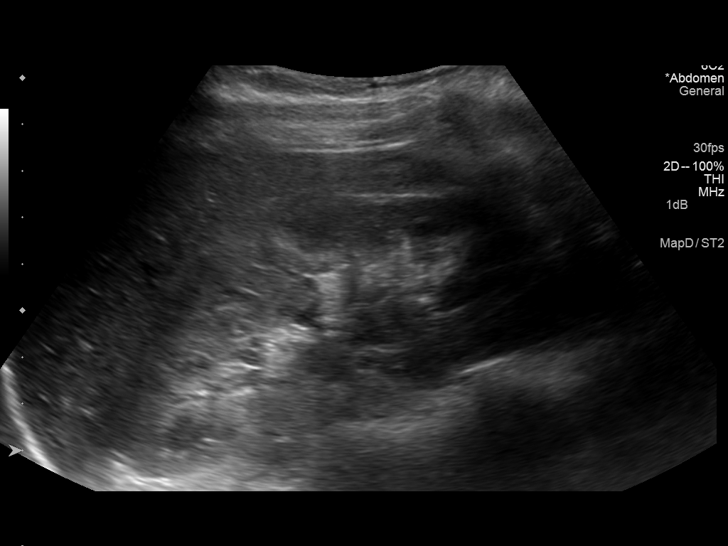
[im 82/123]
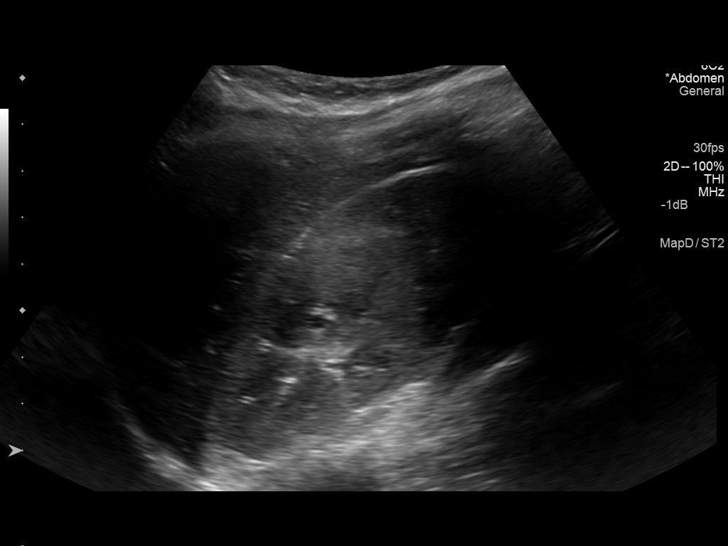
[im 92/123]
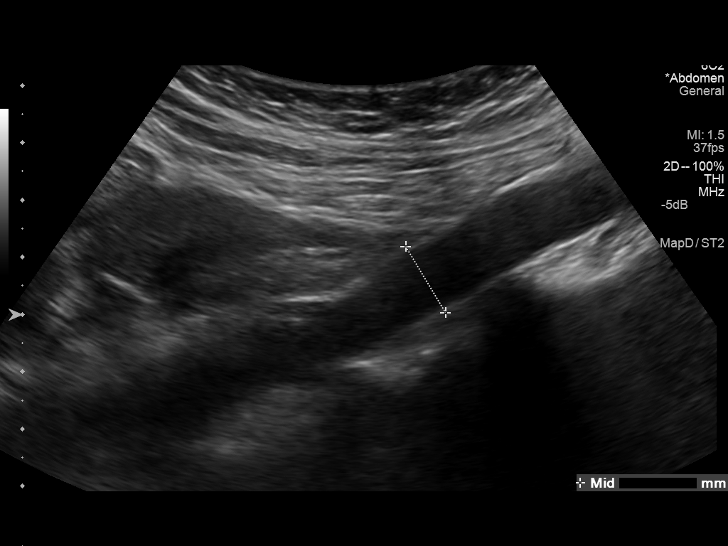
[im 102/123]
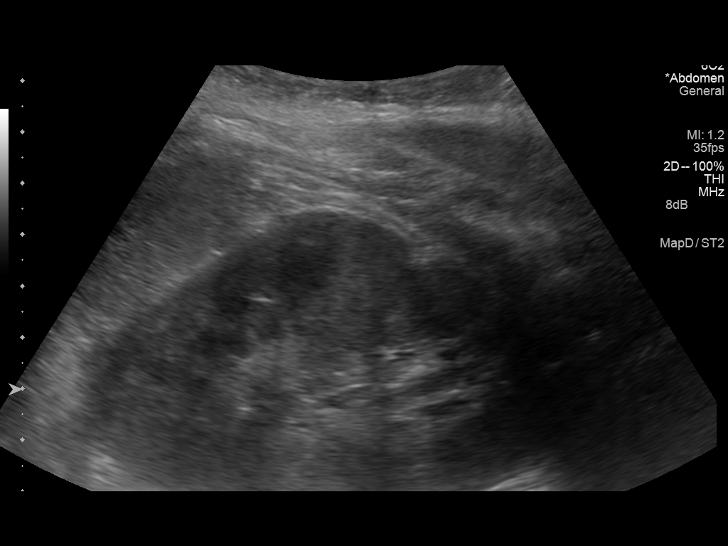
[im 112/123]
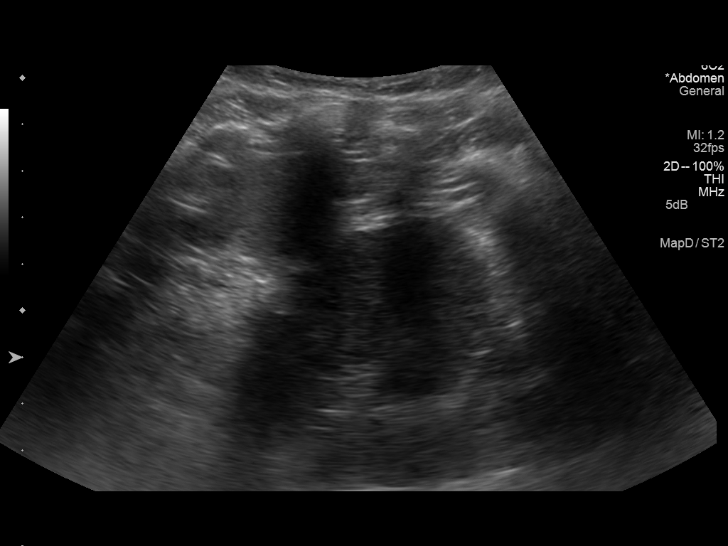
[im 123/123]
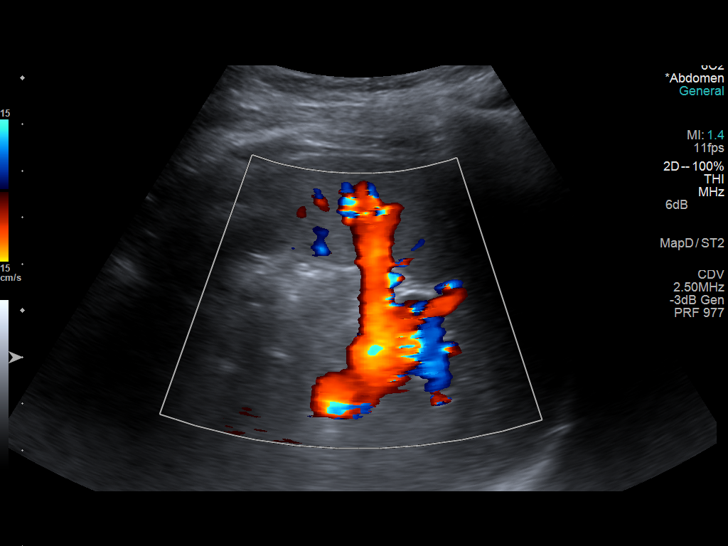

[14 of 25 positions shown; findings below may reference images not displayed]

FINDINGS: Gallbladder: No gallstones or wall thickening visualized. No
sonographic Murphy sign noted.

Common bile duct: Diameter: 2.7 mm

Liver: No focal lesion identified. Within normal limits in
parenchymal echogenicity.

IVC: No abnormality visualized.

Pancreas: Evaluation was limited by bowel gas.

Spleen: Size and appearance within normal limits.

Right Kidney: Length: 10.4 cm. Echogenicity within normal limits. No
mass or hydronephrosis visualized.

Left Kidney: Length: 10 cm. Echogenicity within normal limits. No
mass or hydronephrosis visualized.

Abdominal aorta: No aneurysm visualized.

Other findings: Increased bowel gas throughout much of the abdomen.
IMPRESSION: Normal abdominal ultrasound examination. Evaluation of the pancreas
was limited due to bowel gas.

## 2015-02-19 ENCOUNTER — Encounter (HOSPITAL_COMMUNITY): Payer: Self-pay | Admitting: Emergency Medicine

## 2015-02-19 ENCOUNTER — Emergency Department (HOSPITAL_COMMUNITY)
Admission: EM | Admit: 2015-02-19 | Discharge: 2015-02-19 | Disposition: A | Discharge status: Home / Self Care | Payer: 59 | Attending: Emergency Medicine | Admitting: Emergency Medicine

## 2015-02-19 DIAGNOSIS — R000 Tachycardia, unspecified: Secondary | ICD-10-CM

## 2015-02-19 DIAGNOSIS — Z79899 Other long term (current) drug therapy: Secondary | ICD-10-CM | POA: Diagnosis not present

## 2015-02-19 DIAGNOSIS — Z8719 Personal history of other diseases of the digestive system: Secondary | ICD-10-CM | POA: Diagnosis not present

## 2015-02-19 DIAGNOSIS — Z9889 Other specified postprocedural states: Secondary | ICD-10-CM | POA: Diagnosis not present

## 2015-02-19 DIAGNOSIS — R011 Cardiac murmur, unspecified: Secondary | ICD-10-CM | POA: Insufficient documentation

## 2015-02-19 DIAGNOSIS — R51 Headache: Secondary | ICD-10-CM | POA: Diagnosis not present

## 2015-02-19 DIAGNOSIS — F419 Anxiety disorder, unspecified: Secondary | ICD-10-CM | POA: Insufficient documentation

## 2015-02-19 DIAGNOSIS — R519 Headache, unspecified: Secondary | ICD-10-CM

## 2015-02-19 DIAGNOSIS — M542 Cervicalgia: Secondary | ICD-10-CM | POA: Diagnosis present

## 2015-02-19 LAB — BASIC METABOLIC PANEL
Anion gap: 8 (ref 5–15)
BUN: 12 mg/dL (ref 6–20)
CO2: 24 mmol/L (ref 22–32)
Calcium: 9 mg/dL (ref 8.9–10.3)
Chloride: 106 mmol/L (ref 101–111)
Creatinine, Ser: 0.73 mg/dL (ref 0.44–1.00)
GFR calc Af Amer: >60 mL/min (ref >60)
GFR calc non Af Amer: >60 mL/min (ref >60)
Glucose, Bld: 88 mg/dL (ref 65–99)
Potassium: 3.7 mmol/L (ref 3.5–5.1)
Sodium: 138 mmol/L (ref 135–145)

## 2015-02-19 LAB — CBC WITH DIFFERENTIAL/PLATELET
Basophils Absolute: 0 10*3/uL (ref 0.0–0.1)
Basophils Relative: 0 % (ref 0–1)
Eosinophils Absolute: 0 10*3/uL (ref 0.0–0.7)
Eosinophils Relative: 0 % (ref 0–5)
HCT: 41.5 % (ref 36.0–46.0)
Hemoglobin: 13.9 g/dL (ref 12.0–15.0)
Lymphocytes Relative: 20 % (ref 12–46)
Lymphs Abs: 1 10*3/uL (ref 0.7–4.0)
MCH: 31.4 pg (ref 26.0–34.0)
MCHC: 33.5 g/dL (ref 30.0–36.0)
MCV: 93.9 fL (ref 78.0–100.0)
Monocytes Absolute: 0.2 10*3/uL (ref 0.1–1.0)
Monocytes Relative: 4 % (ref 3–12)
Neutro Abs: 3.7 10*3/uL (ref 1.7–7.7)
Neutrophils Relative %: 76 % (ref 43–77)
Platelets: 273 10*3/uL (ref 150–400)
RBC: 4.42 MIL/uL (ref 3.87–5.11)
RDW: 12.4 % (ref 11.5–15.5)
WBC: 5 10*3/uL (ref 4.0–10.5)

## 2015-02-19 MED ORDER — ACETAMINOPHEN 500 MG PO TABS
1000.0000 mg | ORAL_TABLET | Freq: Once | ORAL | Status: AC
  Administered 2015-02-19: 1000 mg via ORAL
  Filled 2015-02-19: qty 2

## 2015-02-19 MED ORDER — IBUPROFEN 800 MG PO TABS
800.0000 mg | ORAL_TABLET | Freq: Once | ORAL | Status: AC
  Administered 2015-02-19: 800 mg via ORAL
  Filled 2015-02-19: qty 1

## 2015-02-19 NOTE — ED Notes (Signed)
Patient with no complaints at this time. Respirations even and unlabored. Skin warm/dry. Discharge instructions reviewed with patient at this time. Patient given opportunity to voice concerns/ask questions. Patient discharged at this time and left Emergency Department with steady gait.   

## 2015-02-19 NOTE — ED Notes (Signed)
MD at bedside. 

## 2015-02-19 NOTE — ED Notes (Signed)
Having shoulder and neck pain. Rates pain 7/10.  Have not taken any pain medications.

## 2015-02-19 NOTE — ED Notes (Signed)
PT c/o headache with dizziness and neck pain that radiates into her right shoulder since yesterday. PT denies any OTC medication usage.

## 2015-02-19 NOTE — ED Provider Notes (Signed)
CSN: [id number]     Arrival date & time 02/19/15  0934 History  This chart was scribed for Donnetta Hutching, MD by Tanda Rockers, ED Scribe. This patient was seen in room APA02/APA02 and the patient's care was started at 10:57 AM.    Chief Complaint  Patient presents with  . Neck Pain  . Shoulder Pain   The history is provided by the patient and a parent. No language interpreter was used.     HPI Comments: Paula Riley is a 34 y.o. female  who presents to the Emergency Department complaining of neck pain and  occiput pain x 1 day. Pt also complains of dizziness. Pt reports drinking normally. She has never had these symptoms before. PMHx includes palpitations currently on ER metoprolol 25 mg prescribed by Nada Boozer NP. Mom denies confusion or any other symptoms. She has a history of tachycardia. No stiff neck, fever, chills, neurodeficits.   Past Medical History  Diagnosis Date  . GERD (gastroesophageal reflux disease)   . Palpitations   . Heart murmur   . Anxiety   . Hx of echocardiogram 06/2010    normal  . History of Holter monitoring 06/2010 and 11/2011    Cardionet montior with sinus tachycardia, PAC's only, no arrhythmias either monitor   Past Surgical History  Procedure Laterality Date  . Dental surgery     Family History  Problem Relation Age of Onset  . Hypertension Other   . CAD Other   . Diabetes Other    History  Substance Use Topics  . Smoking status: Never Smoker   . Smokeless tobacco: Never Used  . Alcohol Use: No   OB History    Gravida Para Term Preterm AB TAB SAB Ectopic Multiple Living   Review of Systems  A complete 10 system review of systems was obtained and all systems are negative except as noted in the HPI and PMH.    Allergies  Review of patient's allergies indicates no known allergies.  Home Medications   Prior to Admission medications   Medication Sig Start Date End Date Taking? Authorizing Provider  acetaminophen  (TYLENOL) 500 MG tablet Take 1,000 mg by mouth every 6 (six) hours as needed for mild pain.   Yes Historical Provider, MD  cholecalciferol (VITAMIN D) 1000 UNITS tablet Take 1,000 Units by mouth daily.   Yes Historical Provider, MD  KLONOPIN 0.5 MG tablet Take 0.5 mg by mouth daily as needed for anxiety.  09/24/14  Yes Historical Provider, MD  medroxyPROGESTERone (DEPO-PROVERA) 150 MG/ML injection BRING TO MD OFFICE AS DIRECTED 12/27/14  Yes Adline Potter, NP  metoprolol succinate (TOPROL XL) 25 MG 24 hr tablet Take 1 tablet (25 mg total) by mouth daily. 08/28/14  Yes Leone Brand, NP   Triage Vitals: BP 148/84 mmHg  Pulse 118  Temp(Src) 98.8 F (37.1 C)  Resp 18  Ht  (1.651 m)  Wt 114 lb (51.71 kg)  BMI 18.97 kg/m2  SpO2 100%   Physical Exam  Constitutional: She is oriented to person, place, and time. She appears well-developed and well-nourished.  HENT:  Head: Normocephalic and atraumatic.  Eyes: Conjunctivae and EOM are normal. Pupils are equal, round, and reactive to light.  Neck: Normal range of motion. Neck supple.  Cardiovascular: Normal rate and regular rhythm.   Pulmonary/Chest: Effort normal and breath sounds normal.  Abdominal: Soft. Bowel sounds are normal.  Musculoskeletal: Normal range of motion.  Neurological: She is alert and oriented to person, place, and time.  Skin: Skin is warm and dry.  Psychiatric: She has a normal mood and affect. Her behavior is normal.  Nursing note and vitals reviewed.   ED Course  Procedures (including critical care time)  DIAGNOSTIC STUDIES: Oxygen Saturation is 100% on RA, normal by my interpretation.    COORDINATION OF CARE: 11:02 AM-Discussed treatment plan which includes pain medication with pt at bedside and pt agreed to plan.   Labs Review Labs Reviewed  BASIC METABOLIC PANEL  CBC WITH DIFFERENTIAL/PLATELET    Imaging Review No results found.   EKG Interpretation   Date/Time:  Monday February 19 2015  09:52:39 EDT Ventricular Rate:  126 PR Interval:  160 QRS Duration: 72 QT Interval:  295 QTC Calculation: 427 R Axis:   67 Text Interpretation:  Sinus tachycardia Confirmed by Wissam Resor  MD, Talbert Trembath  ([id number]) on 02/19/2015 10:20:16 AM      MDM   Final diagnoses:  Nonintractable headache, unspecified chronicity pattern, unspecified headache type  Tachycardia  Patient has a history of tachycardia. She is in no acute distress. Labs normal. She feels better after Tylenol and ibuprofen. Will follow-up with cardiology for tachycardia I personally performed the services described in this documentation, which was scribed in my presence. The recorded information has been reviewed and is accurate.      Donnetta HutchingBrian Jaid Quirion, MD 02/19/15 [phone]

## 2015-02-19 NOTE — Discharge Instructions (Signed)
Increase fluids. Tylenol or ibuprofen for pain. Continue your medication. Follow-up with cardiology for palpitations and elevated heart rate

## 2015-02-19 NOTE — ED Notes (Signed)
Pt became dizzy after walking back to room.  Pt says she has history of palpitations and is on metoprolol.  EKG done.

## 2015-03-21 ENCOUNTER — Encounter: Payer: Self-pay | Admitting: *Deleted

## 2015-03-21 ENCOUNTER — Ambulatory Visit (INDEPENDENT_AMBULATORY_CARE_PROVIDER_SITE_OTHER): Payer: 59 | Admitting: *Deleted

## 2015-03-21 DIAGNOSIS — Z3202 Encounter for pregnancy test, result negative: Secondary | ICD-10-CM | POA: Diagnosis not present

## 2015-03-21 DIAGNOSIS — Z3042 Encounter for surveillance of injectable contraceptive: Secondary | ICD-10-CM | POA: Diagnosis not present

## 2015-03-21 LAB — POCT URINE PREGNANCY: Preg Test, Ur: NEGATIVE

## 2015-03-21 MED ORDER — MEDROXYPROGESTERONE ACETATE 150 MG/ML IM SUSP
150.0000 mg | Freq: Once | INTRAMUSCULAR | Status: AC
  Administered 2015-03-21: 150 mg via INTRAMUSCULAR

## 2015-03-21 NOTE — Progress Notes (Signed)
Pt here for Depo. Reports no problems at this time. Return in 12 weeks for next shot. JSY 

## 2015-04-09 ENCOUNTER — Ambulatory Visit (INDEPENDENT_AMBULATORY_CARE_PROVIDER_SITE_OTHER): Payer: 59 | Admitting: Neurology

## 2015-04-09 ENCOUNTER — Encounter: Payer: Self-pay | Admitting: Neurology

## 2015-04-09 VITALS — BP 130/70 | HR 145 | Ht 65.0 in | Wt 114.4 lb

## 2015-04-09 DIAGNOSIS — R42 Dizziness and giddiness: Secondary | ICD-10-CM | POA: Diagnosis not present

## 2015-04-09 NOTE — Progress Notes (Signed)
Note sent

## 2015-04-09 NOTE — Progress Notes (Signed)
[facility] Clinic Note - Initial Visit   Date: 04/09/2015  Paula Riley MRN: [id number] DOB: 1980/12/21   Dear Terie Purser, PA:  Thank you for your kind referral of Paula Riley for consultation of dizziness. Although her history is well known to you, please allow [place] to reiterate it for the purpose of our medical record. The patient was accompanied to the clinic by self.    History of Present Illness: Paula Riley is a 34 y.o. right-handed African American female with SVT and anxiety presenting for evaluation of dizziness.   When asked directly to the patient why she is here, she replies "I don't know".  She reports having spells of dizziness for the past few weeks, described as lightheadedness.  She also reports having heavy sensation over her sinus and eyes. Spells last a few seconds, occuring every few weeks.  No identifiable triggers.  She also complains of generalized muscle twitches. No weakness or falls. There is no room spinning sensation or positional changes.  She has previously had NCS/EMG of the right leg which returned normal.  She also complains of intermittent muscle twitches throughout her body.  She again has spells of weakness.  No dysphagia, dysarthria, or falls.   Per referral notes, patient had dizziness and weakness which had subsided, but after learning that her 39 year-old cousin had two strokes, she again developed these symptoms.  She does fear having a serious medical condition and has been to the ER for various complaints.  Out-side paper records, electronic medical record, and images have been reviewed where available and summarized as:  Lab Results  Component Value Date   TSH 1.804 07/01/2010     Past Medical History  Diagnosis Date  . GERD (gastroesophageal reflux disease)   . Palpitations   . Heart murmur   . Anxiety   . Hx of echocardiogram 06/2010    normal  . History of Holter monitoring 06/2010 and 11/2011      Cardionet montior with sinus tachycardia, PAC's only, no arrhythmias either monitor    Past Surgical History  Procedure Laterality Date  . Dental surgery       Medications:  Outpatient Encounter Prescriptions as of 04/09/2015  Medication Sig Note  . acetaminophen (TYLENOL) 500 MG tablet Take 1,000 mg by mouth every 6 (six) hours as needed for mild pain.   . cholecalciferol (VITAMIN D) 1000 UNITS tablet Take 1,000 Units by mouth daily.   Marland Kitchen KLONOPIN 0.5 MG tablet Take 0.5 mg by mouth daily as needed for anxiety.  10/04/2014: Received from: External Pharmacy  . medroxyPROGESTERone (DEPO-PROVERA) 150 MG/ML injection BRING TO MD OFFICE AS DIRECTED 02/19/2015: Patient unsure of last injection  . metoprolol succinate (TOPROL XL) 25 MG 24 hr tablet Take 1 tablet (25 mg total) by mouth daily.    No facility-administered encounter medications on file as of 04/09/2015.     Allergies: No Known Allergies  Family History: Family History  Problem Relation Age of Onset  . Hypertension Other   . CAD Other   . Diabetes Other     Social History: History  Substance Use Topics  . Smoking status: Never Smoker   . Smokeless tobacco: Never Used  . Alcohol Use: No   History   Social History Narrative   Lives with 2 children in an apartment on the second floor.  Works at [employer].  Education: high school.    Review of Systems:  CONSTITUTIONAL: No fevers, chills, night sweats, or weight  loss.   EYES: No visual changes or eye pain ENT: No hearing changes.  No history of nose bleeds.   RESPIRATORY: No cough, wheezing and shortness of breath.   CARDIOVASCULAR: Negative for chest pain, +palpitations.   GI: Negative for abdominal discomfort, blood in stools or black stools.  No recent change in bowel habits.   GU:  No history of incontinence.   MUSCLOSKELETAL: No history of joint pain or swelling.  No myalgias.   SKIN: Negative for lesions, rash, and itching.   HEMATOLOGY/ONCOLOGY: Negative for  prolonged bleeding, bruising easily, and swollen nodes.  No history of cancer.   ENDOCRINE: Negative for cold or heat intolerance, polydipsia or goiter.   PSYCH:  ++depression or anxiety symptoms.   NEURO: As Above.   Vital Signs:  BP 130/70 mmHg  Pulse 145  Ht  (1.651 m)  Wt 114 lb 7 oz (51.909 kg)  BMI 19.04 kg/m2  SpO2 98%   General Medical Exam:   General:  Blunted affect, comfortable.   Eyes/ENT: see cranial nerve examination.   Neck: No masses appreciated.  Full range of motion without tenderness.  No carotid bruits. Respiratory:  Clear to auscultation, good air entry bilaterally.   Cardiac:  Tachycardic rate, normal rhythm, no murmur.   Extremities:  No deformities, edema, or skin discoloration.  Skin:  No rashes or lesions.  Hands are extremely clammy  Neurological Exam: MENTAL STATUS including orientation to time, place, person, recent and remote memory, attention span and concentration, language, and fund of knowledge is normal.  Speech is not dysarthric.  CRANIAL NERVES: II:  No visual field defects.  Unremarkable fundi.   III-IV-VI: Pupils equal round and reactive to light.  Normal conjugate, extra-ocular eye movements in all directions of gaze.  No nystagmus.  No ptosis.   V:  Normal facial sensation.     VII:  Normal facial symmetry and movements.  No pathologic facial reflexes.  VIII:  Normal hearing and vestibular function.   IX-X:  Normal palatal movement.   XI:  Normal shoulder shrug and head rotation.   XII:  Normal tongue strength and range of motion, no deviation or fasciculation.  MOTOR: Generalized tremulousness with motor strength testing.  No pronator drift.  Tone is normal.    Right Upper Extremity:    Left Upper Extremity:    Deltoid  5/5   Deltoid  5/5   Biceps  5/5   Biceps  5/5   Triceps  5/5   Triceps  5/5   Wrist extensors  5/5   Wrist extensors  5/5   Wrist flexors  5/5   Wrist flexors  5/5   Finger extensors  5/5   Finger extensors   5/5   Finger flexors  5/5   Finger flexors  5/5   Dorsal interossei  5/5   Dorsal interossei  5/5   Abductor pollicis  5/5   Abductor pollicis  5/5   Tone (Ashworth scale)  0  Tone (Ashworth scale)  0   Right Lower Extremity:    Left Lower Extremity:    Hip flexors  5/5   Hip flexors  5/5   Hip extensors  5/5   Hip extensors  5/5   Knee flexors  5/5   Knee flexors  5/5   Knee extensors  5/5   Knee extensors  5/5   Dorsiflexors  5/5   Dorsiflexors  5/5   Plantarflexors  5/5   Plantarflexors  5/5  Toe extensors  5/5   Toe extensors  5/5   Toe flexors  5/5   Toe flexors  5/5   Tone (Ashworth scale)  0  Tone (Ashworth scale)  0   MSRs:  Right                                                                 Left brachioradialis 2+  brachioradialis 2+  biceps 2+  biceps 2+  triceps 2+  triceps 2+  patellar 2+  patellar 2+  ankle jerk 2+  ankle jerk 2+  Hoffman no  Hoffman no  plantar response down  plantar response down   SENSORY:  Normal and symmetric perception of light touch, pinprick, vibration, and proprioception.  Romberg's sign absent.   COORDINATION/GAIT: Normal finger-to- nose-finger and heel-to-shin.  Intact rapid alternating movements bilaterally.  Able to rise from a chair without using arms.  Gait narrow based and stable. Tandem and stressed gait intact.    IMPRESSION: Ms. Cliett is a 34 year-old female referred for evaluation of dizziness, which has improved.  Exam is entirely normal and non-focal. Orthostatic vital signs are normal.  I reassured the patient that there are no worrisome findings on exam to suggest intracranial pathology. I do not feel that imaging is necessary as I also believe that symptoms may be manifestation of anxiety. I encouraged her to establish care with a counselor and psychiatrist.  There is evidence of somatic hypervigilance.   She also has SVT with HR in the 130s today, which may also be causing some of her anxiety.  She is seeing Dr. [name]  and is on toprol, but due to low blood pressure, medication cannot be further titrated.  If her symptoms worsen or she develops new neurological symptoms, MRI brain wwo contrast can be ordered.  Return to clinic as needed.   The duration of this appointment visit was 35 minutes of face-to-face time with the patient.  Greater than 50% of this time was spent in counseling, explanation of diagnosis, planning of further management, and coordination of care.   Thank you for allowing me to participate in patient's care.  If I can answer any additional questions, I would be pleased to do so.    Sincerely,    Donika K. Allena Katz, DO

## 2015-04-09 NOTE — Patient Instructions (Signed)
Recommend that you see a counselor for anxiety as stress can manifest in different ways in each person. If symptoms do not improve, please call my office and we can schedule additional testing such as MRI brain

## 2015-04-19 ENCOUNTER — Other Ambulatory Visit: Payer: Self-pay | Admitting: Cardiology

## 2015-05-01 NOTE — Progress Notes (Signed)
      HPI: FU palpitations. Patient had an echocardiogram in October of 2011. Her ejection fraction was 65-70% and there was grade 1 diastolic dysfunction. There was no significant Doppler abnormality. Previous monitor in October of 2011 showed no significant arrhythmias. Cardionet 3/13 revealed sinus to sinus tachycardia with PACs. Repeat 3/16 showed sinus with pacs. Since last seen, patient states her palpitations have improved. She still feels elevated heart rate at times of anxiety. She denies dyspnea, chest pain or syncope.  Current Outpatient Prescriptions  Medication Sig Dispense Refill  . acetaminophen (TYLENOL) 500 MG tablet Take 1,000 mg by mouth every 6 (six) hours as needed for mild pain.    . cholecalciferol (VITAMIN D) 1000 UNITS tablet Take 1,000 Units by mouth daily.    Marland Kitchen KLONOPIN 0.5 MG tablet Take 0.5 mg by mouth daily as needed for anxiety.     . medroxyPROGESTERone (DEPO-PROVERA) 150 MG/ML injection BRING TO MD OFFICE AS DIRECTED 1 mL 4  . metoprolol succinate (TOPROL-XL) 25 MG 24 hr tablet TAKE 1 TABLET BY MOUTH EVERY DAY 30 tablet 0  . [DISCONTINUED] famotidine (PEPCID) 20 MG tablet Take 1 tablet (20 mg total) by mouth 2 (two) times daily. 30 tablet 0   No current facility-administered medications for this visit.     Past Medical History  Diagnosis Date  . GERD (gastroesophageal reflux disease)   . Palpitations   . Heart murmur   . Anxiety   . Hx of echocardiogram 06/2010    normal  . History of Holter monitoring 06/2010 and 11/2011    Cardionet montior with sinus tachycardia, PAC's only, no arrhythmias either monitor    Past Surgical History  Procedure Laterality Date  . Dental surgery      Social History   Social History  . Marital Status: Single    Spouse Name: N/A  . Number of Children: N/A  . Years of Education: N/A   Occupational History  . Not on file.   Social History Main Topics  . Smoking status: Never Smoker   . Smokeless tobacco: Never  Used  . Alcohol Use: No  . Drug Use: No  . Sexual Activity: Yes    Birth Control/ Protection: Injection   Other Topics Concern  . Not on file   Social History Narrative   Lives with 2 children in an apartment on the second floor.     Works at [employer].  Education: high school.    ROS: no fevers or chills, productive cough, hemoptysis, dysphasia, odynophagia, melena, hematochezia, dysuria, hematuria, rash, seizure activity, orthopnea, PND, pedal edema, claudication. Remaining systems are negative.  Physical Exam: Well-developed well-nourished in no acute distress.  Skin is warm and dry.  HEENT is normal.  Neck is supple.  Chest is clear to auscultation with normal expansion.  Cardiovascular exam is regular rate and rhythm.  Abdominal exam nontender or distended. No masses palpated. Extremities show no edema. neuro grossly intact  ECG 02/19/2015-sinus tachycardia, nonspecific ST changes.

## 2015-05-03 ENCOUNTER — Ambulatory Visit (INDEPENDENT_AMBULATORY_CARE_PROVIDER_SITE_OTHER): Payer: 59 | Admitting: Cardiology

## 2015-05-03 ENCOUNTER — Encounter: Payer: Self-pay | Admitting: Cardiology

## 2015-05-03 VITALS — BP 120/40 | HR 120 | Ht 65.0 in | Wt 112.4 lb

## 2015-05-03 DIAGNOSIS — R002 Palpitations: Secondary | ICD-10-CM

## 2015-05-03 DIAGNOSIS — F419 Anxiety disorder, unspecified: Secondary | ICD-10-CM

## 2015-05-03 DIAGNOSIS — F418 Other specified anxiety disorders: Secondary | ICD-10-CM | POA: Insufficient documentation

## 2015-05-03 NOTE — Assessment & Plan Note (Signed)
Symptoms have improved on Toprol. Continue present dose.there appears to be a component of anxiety as well.

## 2015-05-03 NOTE — Patient Instructions (Signed)
Your physician wants you to follow-up in: ONE YEAR WITH DR CRENSHAW You will receive a reminder letter in the mail two months in advance. If you don't receive a letter, please call our office to schedule the follow-up appointment.  

## 2015-05-03 NOTE — Assessment & Plan Note (Signed)
Management per primary care. 

## 2015-05-04 ENCOUNTER — Telehealth: Payer: Self-pay | Admitting: Cardiology

## 2015-05-04 NOTE — Telephone Encounter (Signed)
Recommendation from OV note - continue current dose.  Unable to reach patient. Left msg instructing her to call back.

## 2015-05-04 NOTE — Telephone Encounter (Signed)
Pt called in stating that she was in to see Dr. [name] yesterday and she wanted to know that based on her BP reading if he wanted her to continue to take the same dosage of Metoprolol. Please call and advise  Thanks

## 2015-05-04 NOTE — Telephone Encounter (Signed)
Advised pt to keep current dose, check BP/HR at home if she is able.  Recommended to call again for any concerns or questions.  Pt voiced understanding, all questions addressed to her satisfaction.

## 2015-05-07 ENCOUNTER — Ambulatory Visit (INDEPENDENT_AMBULATORY_CARE_PROVIDER_SITE_OTHER): Payer: 59 | Admitting: Internal Medicine

## 2015-05-16 ENCOUNTER — Other Ambulatory Visit: Payer: Self-pay | Admitting: Cardiology

## 2015-06-04 ENCOUNTER — Ambulatory Visit (HOSPITAL_COMMUNITY)
Admission: RE | Admit: 2015-06-04 | Discharge: 2015-06-04 | Disposition: A | Discharge status: Home / Self Care | Payer: 59 | Source: Ambulatory Visit | Attending: Family Medicine | Admitting: Family Medicine

## 2015-06-04 ENCOUNTER — Other Ambulatory Visit (HOSPITAL_COMMUNITY): Payer: Self-pay | Admitting: Family Medicine

## 2015-06-04 DIAGNOSIS — M542 Cervicalgia: Secondary | ICD-10-CM

## 2015-06-13 ENCOUNTER — Encounter: Payer: Self-pay | Admitting: *Deleted

## 2015-06-13 ENCOUNTER — Ambulatory Visit (INDEPENDENT_AMBULATORY_CARE_PROVIDER_SITE_OTHER): Payer: 59 | Admitting: *Deleted

## 2015-06-13 DIAGNOSIS — Z3202 Encounter for pregnancy test, result negative: Secondary | ICD-10-CM | POA: Diagnosis not present

## 2015-06-13 DIAGNOSIS — Z3042 Encounter for surveillance of injectable contraceptive: Secondary | ICD-10-CM

## 2015-06-13 LAB — POCT URINE PREGNANCY: Preg Test, Ur: NEGATIVE

## 2015-06-13 MED ORDER — MEDROXYPROGESTERONE ACETATE 150 MG/ML IM SUSP
150.0000 mg | Freq: Once | INTRAMUSCULAR | Status: AC
  Administered 2015-06-13: 150 mg via INTRAMUSCULAR

## 2015-06-13 NOTE — Progress Notes (Signed)
Pt here for Depo. Reports no problems at this time. Return in 12 weeks for next shot. JSY 

## 2015-09-05 ENCOUNTER — Ambulatory Visit (INDEPENDENT_AMBULATORY_CARE_PROVIDER_SITE_OTHER): Payer: 59 | Admitting: *Deleted

## 2015-09-05 ENCOUNTER — Encounter: Payer: Self-pay | Admitting: *Deleted

## 2015-09-05 DIAGNOSIS — Z3202 Encounter for pregnancy test, result negative: Secondary | ICD-10-CM | POA: Diagnosis not present

## 2015-09-05 DIAGNOSIS — Z3042 Encounter for surveillance of injectable contraceptive: Secondary | ICD-10-CM

## 2015-09-05 LAB — POCT URINE PREGNANCY: Preg Test, Ur: NEGATIVE

## 2015-09-05 MED ORDER — MEDROXYPROGESTERONE ACETATE 150 MG/ML IM SUSP
150.0000 mg | Freq: Once | INTRAMUSCULAR | Status: AC
  Administered 2015-09-05: 150 mg via INTRAMUSCULAR

## 2015-09-05 NOTE — Progress Notes (Signed)
Pt here for Depo. Reports no problems at this time. Return in 12 weeks for next shot. JSY 

## 2015-11-28 ENCOUNTER — Encounter: Payer: Self-pay | Admitting: *Deleted

## 2015-11-28 ENCOUNTER — Ambulatory Visit (INDEPENDENT_AMBULATORY_CARE_PROVIDER_SITE_OTHER): Payer: BLUE CROSS/BLUE SHIELD | Admitting: *Deleted

## 2015-11-28 DIAGNOSIS — Z308 Encounter for other contraceptive management: Secondary | ICD-10-CM

## 2015-11-28 DIAGNOSIS — Z3202 Encounter for pregnancy test, result negative: Secondary | ICD-10-CM

## 2015-11-28 DIAGNOSIS — Z3042 Encounter for surveillance of injectable contraceptive: Secondary | ICD-10-CM

## 2015-11-28 LAB — POCT URINE PREGNANCY: Preg Test, Ur: NEGATIVE

## 2015-11-28 MED ORDER — MEDROXYPROGESTERONE ACETATE 150 MG/ML IM SUSP
150.0000 mg | Freq: Once | INTRAMUSCULAR | Status: AC
  Administered 2015-11-28: 150 mg via INTRAMUSCULAR

## 2015-11-28 NOTE — Progress Notes (Signed)
Patient ID: Paula Riley, female   DOB: 07/13/1981, 35 y.o.   MRN: [id number] Depo Provera 150 mg IM given in right deltoid with no complications, negative pregnancy test. Pt to return in 12 weeks for next injection.

## 2016-02-12 ENCOUNTER — Telehealth: Payer: Self-pay | Admitting: Cardiology

## 2016-02-12 NOTE — Telephone Encounter (Signed)
Increase toprol to 50 mg daily Brian Crenshaw  

## 2016-02-12 NOTE — Telephone Encounter (Signed)
Spoke with pt, her palpitations have really been bothering her the last couple days. She only gets relief if she sits or lies down. Nothing has changed as far as work or diet is concerned. The palpitations have made her anxiety worse. She is worried about increasing metoprolol because of her bp. Follow up scheduled for July with dr Jens Somcrenshaw. She wonders if anything to do prior to appt. Will forward for dr Jens Somcrenshaw review

## 2016-02-12 NOTE — Telephone Encounter (Signed)
Pease call,she says she is having palpitations. She says she have some questions.

## 2016-02-12 NOTE — Telephone Encounter (Signed)
Left message for pt to call.

## 2016-02-13 MED ORDER — METOPROLOL SUCCINATE ER 25 MG PO TB24: 25.0000 mg | ORAL_TABLET | Freq: Two times a day (BID) | ORAL | Status: DC

## 2016-02-13 NOTE — Telephone Encounter (Signed)
Spoke with pt, Aware of dr Ludwig Clarkscrenshaw's recommendations.  She is very apprehensive about taking the 50 mg once daily because of low bp. She will try taking the 25 mg twice daily. Patient voiced understanding to call if she develops dizziness.

## 2016-02-18 ENCOUNTER — Telehealth: Payer: Self-pay | Admitting: Cardiology

## 2016-02-18 NOTE — Telephone Encounter (Signed)
The patient called in reporting palpitations over weekend. She denies SOB, dizziness, nausea, or chest discomfort. She states she has not started the higher metoprolol dose recommended to her (see 5/30 note). I suggested she use this. We reviewed her recent BPs. She reports readings of 115-120/75-80.  Pt expressed concern, wanted to make sure her BPs would not drop. Advised to start the higher dose metoprolol, check her BP daily, and call if concerns or if palpitations did not improve. We reviewed symptoms of low BP. Pt voiced understanding of recommendations.

## 2016-02-18 NOTE — Telephone Encounter (Signed)
New message    The pt is going about palpations    Patient c/o Palpitations:  High priority if patient c/o lightheadedness and shortness of breath.  1. How long have you been having palpitations? Since Friday   2. Are you currently experiencing lightheadedness and shortness of breath? No  3. Have you checked your BP and heart rate? (document readings) b/p 117/74 over the weekend  4. Are you experiencing any other symptoms? When the pt starts to do any activity

## 2016-02-19 ENCOUNTER — Other Ambulatory Visit: Payer: Self-pay | Admitting: Adult Health

## 2016-02-20 ENCOUNTER — Ambulatory Visit (INDEPENDENT_AMBULATORY_CARE_PROVIDER_SITE_OTHER): Payer: BLUE CROSS/BLUE SHIELD | Admitting: *Deleted

## 2016-02-20 ENCOUNTER — Encounter: Payer: Self-pay | Admitting: *Deleted

## 2016-02-20 DIAGNOSIS — Z3042 Encounter for surveillance of injectable contraceptive: Secondary | ICD-10-CM

## 2016-02-20 DIAGNOSIS — Z3202 Encounter for pregnancy test, result negative: Secondary | ICD-10-CM

## 2016-02-20 LAB — POCT URINE PREGNANCY: Preg Test, Ur: NEGATIVE

## 2016-02-20 MED ORDER — MEDROXYPROGESTERONE ACETATE 150 MG/ML IM SUSP
150.0000 mg | Freq: Once | INTRAMUSCULAR | Status: AC
  Administered 2016-02-20: 150 mg via INTRAMUSCULAR

## 2016-02-20 NOTE — Progress Notes (Signed)
Pt here for Depo. Pt tolerated shot well. Return in 12 weeks for next shot. JSY 

## 2016-02-26 ENCOUNTER — Telehealth: Payer: Self-pay | Admitting: Cardiology

## 2016-02-26 NOTE — Telephone Encounter (Signed)
New message     Pt c/o medication issue:  1. Name of Medication: Metoprolol  2. How are you currently taking this medication (dosage and times per day)? 25 mg po twice daily  3. Are you having a reaction (difficulty breathing--STAT)? no  4. What is your medication issue? The pt states, the medication is not working the pt wants to speak with a nurse on other suggestions

## 2016-02-26 NOTE — Telephone Encounter (Signed)
Received incoming call from pt. She is calling because she feels her palpitations are not getting better. She started the metoprolol 50mg  twice a day last Friday, 02/22/16. She said she's still having the palpitations several times a day, duration a couple seconds. They occur mostly when she is working. She last took her BP on 02/22/16 and it was 100/74, HR 94. Denies CP, SOB, or dizziness. She has OV visit scheduled for 03/06/16 with Theodore Demarkhonda Barrett. Patient wonders if she needs to be sooner or what else she should do.  Will route to Dr Jens Somrenshaw and Deliah Goodyebra Mathis.

## 2016-02-26 NOTE — Telephone Encounter (Signed)
No answer. Phone kept on ringing until it went to a beep noise. No way to leave a message.

## 2016-02-27 NOTE — Telephone Encounter (Signed)
Spoke with patient Aware to keep appointment, continue with current medication

## 2016-02-27 NOTE — Telephone Encounter (Signed)
Fu 6/22 as scheduled Paula MillersBrian Riley

## 2016-03-06 ENCOUNTER — Encounter: Payer: Self-pay | Admitting: Physician Assistant

## 2016-03-06 ENCOUNTER — Ambulatory Visit (INDEPENDENT_AMBULATORY_CARE_PROVIDER_SITE_OTHER): Payer: BLUE CROSS/BLUE SHIELD | Admitting: Physician Assistant

## 2016-03-06 VITALS — BP 138/68 | HR 103 | Ht 65.0 in | Wt 124.6 lb

## 2016-03-06 DIAGNOSIS — R002 Palpitations: Secondary | ICD-10-CM

## 2016-03-06 NOTE — Progress Notes (Signed)
Cardiology Office Note   Date:  03/06/2016   ID:  Paula Riley, DOB 05/27/1981, MRN [id number]  PCP:  Pershing ProudJACKSON,[name], PA-C  Cardiologist:  Dr Rosemarie Beathrenshaw  Rian Koon, PA-C   Chief Complaint  Patient presents with  . Palpitations    f/u    History of Present Illness: Paula Riley is a 35 y.o. female with a history of palpitations, s/p echo w/ EF 65-70%, grade 1 dd, PACs on monitor from 11/2014  Paula Riley presents for Further evaluation of her palpitations. She has recently started having more palpitations.  They have been mostly in the morning, mostly at work. When she goes home in the afternoon and relaxes, the palpitations decrease. She is compliant with her metoprolol, taking 2- 25 mg tablets every morning. She does not use any caffeine at all. She does not smoke, drink, or do any drugs. Her thyroid is checked yearly by her family physician and has always been okay.  The palpitations do not come and long runs. She will feel her heart skip, but it happens so frequently upsets her. She admits that when she feels her heart start to skip, she will get anxious, and that will make the palpitations worse. She was given a when necessary prescription for Klonopin for anxiety and admits that she feels anxious frequently but doesn't take it. She is afraid of having problems with drugs and so she avoids it. She has not had long runs of palpitations and has not had presyncope or syncope. The palpitations have just been very upsetting for her.  She lives with her 2 sons, currently ages 435 and 259. She admits that she has "white coat syndrome" and that her heart rate and blood pressure will go up any time she goes into a doctor's office or gets anxious about something. She states that her blood pressure at home last time she checked it was 104 and her heart rate runs lower there as well.. She doesn't remember any other exact numbers, but states that it is always lower at home.   Past Medical  History  Diagnosis Date  . GERD (gastroesophageal reflux disease)   . Palpitations   . Heart murmur   . Anxiety   . Hx of echocardiogram 06/2010    normal  . History of Holter monitoring 06/2010 and 11/2011    Cardionet montior with sinus tachycardia, PAC's only, no arrhythmias either monitor    Past Surgical History  Procedure Laterality Date  . Dental surgery      Current Outpatient Prescriptions  Medication Sig Dispense Refill  . acetaminophen (TYLENOL) 500 MG tablet Take 1,000 mg by mouth every 6 (six) hours as needed for mild pain.    Marland Kitchen. KLONOPIN 0.5 MG tablet Take 0.5 mg by mouth daily as needed for anxiety.     . medroxyPROGESTERone (DEPO-PROVERA) 150 MG/ML injection BRING TO MD OFFICE AS DIRECTED 1 mL 1  . metoprolol succinate (TOPROL-XL) 25 MG 24 hr tablet Take 1 tablet (25 mg total) by mouth 2 (two) times daily. 60 tablet 11  . Vitamin D, Cholecalciferol, 400 units CAPS Take 1 capsule by mouth every morning.    . [DISCONTINUED] famotidine (PEPCID) 20 MG tablet Take 1 tablet (20 mg total) by mouth 2 (two) times daily. 30 tablet 0   No current facility-administered medications for this visit.    Allergies:   Review of patient's allergies indicates no known allergies.    Social History:  The patient  reports that she has  never smoked. She has never used smokeless tobacco. She reports that she does not drink alcohol or use illicit drugs.   Family History:  The patient's family history includes Breast cancer in her maternal grandmother; CAD in her other; Diabetes in her maternal grandmother; Healthy in her sister, sister, and son; Hypertension in her other.    ROS:  Please see the history of present illness. All other systems are reviewed and negative.    PHYSICAL EXAM: VS:  BP 138/68 mmHg  Pulse 103  Ht 5\' 5"  (1.651 m)  Wt 124 lb 9.6 oz (56.518 kg)  BMI 20.73 kg/m2 , BMI Body mass index is 20.73 kg/(m^2). GEN: Well nourished, well developed, female in no acute  distress HEENT: normal for age  Neck: no JVD, no carotid bruit, no masses Cardiac: RRR; soft murmur, no rubs, or gallops Respiratory:  clear to auscultation bilaterally, normal work of breathing GI: soft, nontender, nondistended, + BS MS: no deformity or atrophy; no edema; distal pulses are 2+ in all 4 extremities  Skin: warm and dry, no rash Neuro:  Strength and sensation are intact Psych: euthymic mood, full affect   EKG:  EKG is ordered today. The ekg ordered today demonstrates sinus tachycardia, rate 103, no acute ischemic changes   Recent Labs: No results found for requested labs within last 365 days.    Lipid Panel No results found for: CHOL, TRIG, HDL, CHOLHDL, VLDL, LDLCALC, LDLDIRECT   Wt Readings from Last 3 Encounters:  03/06/16 124 lb 9.6 oz (56.518 kg)  05/03/15 112 lb 6 oz (50.973 kg)  04/09/15 114 lb 7 oz (51.909 kg)     Other studies Reviewed: Additional studies/ records that were reviewed today include: Previous office notes and testing.  ASSESSMENT AND PLAN:  1.  Palpitations, PACs on the monitor: She is already on a beta blocker. According to the patient, her home blood pressure readings are too low to allow for the beta blocker to be up titrated. She is not on any substances that would increase the frequency or severity of the palpitations.  I discussed with her that her palpitations were not life-threatening and would not cause her heart to get weak, or cause loss of consciousness. She was reassured by this. I asked her about her underlying anxiety level, and she admitted that may be part of the problem. She is encouraged to follow-up with her family physician if she feels that she is having constant anxiety and that is effecting her health.  2. Anxiety: Her anxiety seemed to be significant and even talking with her commonly about the palpitations was stressful for her. She is encouraged to discuss this with her family physician. She was advised that it is  okay to take the Klonopin as needed but I agree that it is not a medicine that should be taken a great deal.   Current medicines are reviewed at length with the patient today.  The patient does not have concerns regarding medicines.  The following changes have been made:  no change  Labs/ tests ordered today include:   ECG  Disposition:   FU with Dr. Jens Somrenshaw  Signed, Theodore DemarkBarrett, Zoltan Genest, PA-C  03/06/2016 5:37 PM    [facility] Phone: [phone]; Fax: [phone]  This note was written with the assistance of speech recognition software. Please excuse any transcriptional errors.

## 2016-03-06 NOTE — Patient Instructions (Signed)
Continue same medications    Your physician recommends that you schedule a follow-up appointment in: 3 months with Dr.Crenshaw

## 2016-05-14 ENCOUNTER — Ambulatory Visit (INDEPENDENT_AMBULATORY_CARE_PROVIDER_SITE_OTHER): Payer: BLUE CROSS/BLUE SHIELD | Admitting: *Deleted

## 2016-05-14 ENCOUNTER — Encounter: Payer: Self-pay | Admitting: *Deleted

## 2016-05-14 DIAGNOSIS — Z3042 Encounter for surveillance of injectable contraceptive: Secondary | ICD-10-CM

## 2016-05-14 DIAGNOSIS — Z3202 Encounter for pregnancy test, result negative: Secondary | ICD-10-CM

## 2016-05-14 DIAGNOSIS — Z308 Encounter for other contraceptive management: Secondary | ICD-10-CM

## 2016-05-14 LAB — POCT URINE PREGNANCY: Preg Test, Ur: NEGATIVE

## 2016-05-14 MED ORDER — MEDROXYPROGESTERONE ACETATE 150 MG/ML IM SUSP
150.0000 mg | Freq: Once | INTRAMUSCULAR | Status: AC
  Administered 2016-05-14: 150 mg via INTRAMUSCULAR

## 2016-05-27 ENCOUNTER — Encounter: Payer: Self-pay | Admitting: Cardiology

## 2016-06-08 NOTE — Progress Notes (Signed)
HPI: FU palpitations. Patient had an echocardiogram in October of 2011. Her ejection fraction was 65-70% and there was grade 1 diastolic dysfunction. There was no significant Doppler abnormality. Previous monitor in October of 2011 showed no significant arrhythmias. Cardionet 3/13 revealed sinus to sinus tachycardia with PACs. Repeat 3/16 showed sinus with pacs. Since last seen, she continues to have occasional palpitations described as a skip. She denies dyspnea, chest pain or syncope.   Current Outpatient Prescriptions  Medication Sig Dispense Refill  . acetaminophen (TYLENOL) 500 MG tablet Take 1,000 mg by mouth every 6 (six) hours as needed for mild pain.    Marland Kitchen KLONOPIN 0.5 MG tablet Take 0.5 mg by mouth daily as needed for anxiety.     . medroxyPROGESTERone (DEPO-PROVERA) 150 MG/ML injection BRING TO MD OFFICE AS DIRECTED 1 mL 1  . metoprolol succinate (TOPROL-XL) 25 MG 24 hr tablet Take 1 tablet (25 mg total) by mouth 2 (two) times daily. 60 tablet 11  . Omega-3 Fatty Acids (FISH OIL PO) Take 2,000 mg by mouth daily.    . Vitamin D, Cholecalciferol, 400 units CAPS Take 1 capsule by mouth every morning.     No current facility-administered medications for this visit.      Past Medical History:  Diagnosis Date  . Anxiety   . GERD (gastroesophageal reflux disease)   . Heart murmur   . History of Holter monitoring 06/2010 and 11/2011   Cardionet montior with sinus tachycardia, PAC's only, no arrhythmias either monitor  . Hx of echocardiogram 06/2010   normal  . Palpitations     Past Surgical History:  Procedure Laterality Date  . DENTAL SURGERY      Social History   Social History  . Marital status: Single    Spouse name: N/A  . Number of children: N/A  . Years of education: N/A   Occupational History  . [employer]    Social History Main Topics  . Smoking status: Never Smoker  . Smokeless tobacco: Never Used  . Alcohol use No  . Drug use: No  . Sexual activity:  Yes    Birth control/ protection: Injection   Other Topics Concern  . Not on file   Social History Narrative   Lives with 2 children in an apartment on the second floor.     Works at [employer].  Education: high school.    Family History  Problem Relation Age of Onset  . Hypertension Other   . CAD Other   . Diabetes Maternal Grandmother   . Breast cancer Maternal Grandmother   . Healthy Son   . Healthy Sister     x 6  . Healthy Sister     x 2    ROS: no fevers or chills, productive cough, hemoptysis, dysphasia, odynophagia, melena, hematochezia, dysuria, hematuria, rash, seizure activity, orthopnea, PND, pedal edema, claudication. Remaining systems are negative.  Physical Exam: Well-developed well-nourished in no acute distress.  Skin is warm and dry.  HEENT is normal.  Neck is supple.  Chest is clear to auscultation with normal expansion.  Cardiovascular exam is regular rate and rhythm.  Abdominal exam nontender or distended. No masses palpated. Extremities show no edema. neuro grossly intact  ECG-Sinus tachycardia at a rate of 108. No ST changes.  A/P  1 Palpitations-felt secondary to PACs and PVCs. Her symptoms are reasonably well controlled. We discussed increasing her beta blocker but she would like to continue at present dose. I will advance  in the future if needed.  2 anxiety-management per primary care.  Olga MillersBrian Crenshaw, MD

## 2016-06-10 ENCOUNTER — Ambulatory Visit (INDEPENDENT_AMBULATORY_CARE_PROVIDER_SITE_OTHER): Payer: BLUE CROSS/BLUE SHIELD | Admitting: Cardiology

## 2016-06-10 ENCOUNTER — Encounter: Payer: Self-pay | Admitting: Cardiology

## 2016-06-10 VITALS — BP 126/70 | HR 108 | Ht 65.0 in | Wt 126.0 lb

## 2016-06-10 DIAGNOSIS — R002 Palpitations: Secondary | ICD-10-CM

## 2016-06-10 NOTE — Patient Instructions (Signed)
Your physician wants you to follow-up in: ONE YEAR WITH DR CRENSHAW You will receive a reminder letter in the mail two months in advance. If you don't receive a letter, please call our office to schedule the follow-up appointment.   If you need a refill on your cardiac medications before your next appointment, please call your pharmacy.  

## 2016-06-12 ENCOUNTER — Ambulatory Visit: Payer: Medicaid Other | Admitting: Advanced Practice Midwife

## 2016-06-24 ENCOUNTER — Encounter: Payer: Self-pay | Admitting: Advanced Practice Midwife

## 2016-06-24 ENCOUNTER — Ambulatory Visit (INDEPENDENT_AMBULATORY_CARE_PROVIDER_SITE_OTHER): Payer: BLUE CROSS/BLUE SHIELD | Admitting: Advanced Practice Midwife

## 2016-06-24 VITALS — BP 134/70 | HR 80 | Wt 128.0 lb

## 2016-06-24 DIAGNOSIS — Z30011 Encounter for initial prescription of contraceptive pills: Secondary | ICD-10-CM

## 2016-06-24 DIAGNOSIS — Z3009 Encounter for other general counseling and advice on contraception: Secondary | ICD-10-CM

## 2016-06-24 MED ORDER — NORETHIN-ETH ESTRAD-FE BIPHAS 1 MG-10 MCG / 10 MCG PO TABS: 1.0000 | ORAL_TABLET | Freq: Every day | ORAL | 11 refills | Status: DC

## 2016-06-24 NOTE — Patient Instructions (Signed)

## 2016-06-24 NOTE — Progress Notes (Signed)
[facility] Visit  Patient name: Paula Riley MRN [id number]  Date of birth: 08/22/1981  CC & HPI:  Paula Riley is a 35 y.o. African American female presenting today for contraception management.  Has been on depo for 5 years. PCP recommended changing d/t bone loss potential.  Wants COCs  Pertinent History Reviewed:  Medical & Surgical Hx:   Past Medical History:  Diagnosis Date  . Anxiety   . GERD (gastroesophageal reflux disease)   . Heart murmur   . History of Holter monitoring 06/2010 and 11/2011   Cardionet montior with sinus tachycardia, PAC's only, no arrhythmias either monitor  . Hx of echocardiogram 06/2010   normal  . Palpitations    Past Surgical History:  Procedure Laterality Date  . DENTAL SURGERY     Family History  Problem Relation Age of Onset  . Hypertension Other   . CAD Other   . Diabetes Maternal Grandmother   . Breast cancer Maternal Grandmother   . Healthy Son   . Healthy Sister     x 6  . Healthy Sister     x 2    Current Outpatient Prescriptions:  .  acetaminophen (TYLENOL) 500 MG tablet, Take 1,000 mg by mouth every 6 (six) hours as needed for mild pain., Disp: , Rfl:  .  medroxyPROGESTERone (DEPO-PROVERA) 150 MG/ML injection, BRING TO MD OFFICE AS DIRECTED, Disp: 1 mL, Rfl: 1 .  metoprolol succinate (TOPROL-XL) 25 MG 24 hr tablet, Take 1 tablet (25 mg total) by mouth 2 (two) times daily., Disp: 60 tablet, Rfl: 11 .  Omega-3 Fatty Acids (FISH OIL PO), Take 2,000 mg by mouth daily., Disp: , Rfl:  .  Vitamin D, Cholecalciferol, 400 units CAPS, Take 1 capsule by mouth every morning., Disp: , Rfl:  .  KLONOPIN 0.5 MG tablet, Take 0.5 mg by mouth daily as needed for anxiety. , Disp: , Rfl:  .  Norethindrone-Ethinyl Estradiol-Fe Biphas (LO LOESTRIN FE) 1 MG-10 MCG / 10 MCG tablet, Take 1 tablet by mouth daily., Disp: 1 Package, Rfl: 11 Social History: Reviewed -  reports that she has never smoked. She has never used smokeless  tobacco.  Review of Systems:   Constitutional: Negative for fever and chills Eyes: Negative for visual disturbances Respiratory: Negative for shortness of breath, dyspnea Cardiovascular: Negative for chest pain .  On Toprol for palpitaitons Gastrointestinal: Negative for vomiting, diarrhea and constipation; no abdominal pain Genitourinary: Negative for dysuria and urgency, vaginal irritation or itching Musculoskeletal: Negative for back pain, joint pain, myalgias  Neurological: Negative for dizziness and headaches    Objective Findings:    Physical Examination: General appearance - well appearing, and in no distress Mental status - alert, oriented to person, place, and time Chest:  Normal respiratory effort Heart - normal rate and regular rhythm Abdomen:  Soft, nontender Musculoskeletal:  Normal range of motion without pain Extremities:  No edema    No results found for this or any previous visit (from the past 24 hour(s)).    Assessment & Plan:  A:   BC management P:  LoLoestrin start 11/1   No Follow-up on file.  CRESENZO-DISHMAN,Emmary Culbreath CNM 06/24/2016 3:17 PM

## 2016-07-30 ENCOUNTER — Other Ambulatory Visit: Payer: Self-pay | Admitting: Adult Health

## 2016-07-31 ENCOUNTER — Encounter: Payer: Self-pay | Admitting: *Deleted

## 2016-07-31 ENCOUNTER — Ambulatory Visit (INDEPENDENT_AMBULATORY_CARE_PROVIDER_SITE_OTHER): Payer: BLUE CROSS/BLUE SHIELD | Admitting: *Deleted

## 2016-07-31 DIAGNOSIS — Z308 Encounter for other contraceptive management: Secondary | ICD-10-CM

## 2016-07-31 DIAGNOSIS — Z3202 Encounter for pregnancy test, result negative: Secondary | ICD-10-CM

## 2016-07-31 LAB — POCT URINE PREGNANCY: Preg Test, Ur: NEGATIVE

## 2016-07-31 MED ORDER — MEDROXYPROGESTERONE ACETATE 150 MG/ML IM SUSP
150.0000 mg | Freq: Once | INTRAMUSCULAR | Status: AC
  Administered 2016-07-31: 150 mg via INTRAMUSCULAR

## 2016-07-31 NOTE — Progress Notes (Signed)
Pt here for Depo. Pt is interested in IUD. To schedule an appt to discuss with provider. Pt tolerated shot well. Return in 6 weeks to discuss IUD and return for placement if that's what she chooses. JSY

## 2016-09-11 ENCOUNTER — Ambulatory Visit: Payer: BLUE CROSS/BLUE SHIELD | Admitting: Advanced Practice Midwife

## 2016-09-22 ENCOUNTER — Telehealth: Payer: Self-pay | Admitting: Cardiology

## 2016-09-22 NOTE — Telephone Encounter (Signed)
Spoke w patient.  Re palpitations She states they have been more noticeable in the last few days. She's not having any shortness of breath, dizziness, fatigue, etc. Reports that she's on her feet a lot for work (she works at [employer]) and notes the palps more when she is up, ambulatory, and active. Mainly palps are relieved by sitting and resting.  She hasn't noted that her metoprolol has helped much. She voices she is taking as instructed. Reports her awareness that at last OV her BP read higher than typical for home. She notes she doesn't BPs at home daily, but generally they run ~110/70s. She's aware she would need to take caution in increasing metoprolol.  Pt asking for any recommendations prior to visit w Dr. Jens Somrenshaw in 2 weeks. I offered PA visit which she declined for now. She was agreeable to seeing PA if Dr. Jens Somrenshaw advised, conditional on getting around her work schedule.

## 2016-09-22 NOTE — Telephone Encounter (Signed)
°  New Prob  Patient c/o Palpitations:  High priority if patient c/o lightheadedness and shortness of breath.  1. How long have you been having palpitations?  Pt states she has them all the time but last 5 days palpitations have been more frequent.  2. Are you currently experiencing lightheadedness and shortness of breath? No  3. Have you checked your BP and heart rate? (document readings) No  4. Are you experiencing any other symptoms? No

## 2016-09-22 NOTE — Telephone Encounter (Signed)
Fu ov as scheduled Paula Riley

## 2016-09-23 NOTE — Telephone Encounter (Signed)
Spoke with pt, Aware of dr crenshaw's recommendations.  °

## 2016-09-26 NOTE — Progress Notes (Signed)
HPI: FU palpitations. Patient had an echocardiogram in October of 2011. Her ejection fraction was 65-70% and there was grade 1 diastolic dysfunction. There was no significant Doppler abnormality. Previous monitor in October of 2011 showed no significant arrhythmias. Cardionet 3/13 revealed sinus to sinus tachycardia with PACs. Repeat 3/16 showed sinus with pacs. Since last seen, patient denies dyspnea, chest pain or syncope. She is having problems with palpitations particularly at work.  Current Outpatient Prescriptions  Medication Sig Dispense Refill  . acetaminophen (TYLENOL) 500 MG tablet Take 1,000 mg by mouth every 6 (six) hours as needed for mild pain.    Marland Kitchen KLONOPIN 0.5 MG tablet Take 0.5 mg by mouth daily as needed for anxiety.     . medroxyPROGESTERone (DEPO-PROVERA) 150 MG/ML injection BRING TO MD OFFICE AS DIRECTED 1 mL 3  . metoprolol succinate (TOPROL-XL) 25 MG 24 hr tablet Take 1 tablet (25 mg total) by mouth 2 (two) times daily. 60 tablet 11  . Omega-3 Fatty Acids (FISH OIL PO) Take 2,000 mg by mouth daily.    . Vitamin D, Cholecalciferol, 400 units CAPS Take 1 capsule by mouth every morning.     No current facility-administered medications for this visit.      Past Medical History:  Diagnosis Date  . Anxiety   . GERD (gastroesophageal reflux disease)   . Heart murmur   . History of Holter monitoring 06/2010 and 11/2011   Cardionet montior with sinus tachycardia, PAC's only, no arrhythmias either monitor  . Hx of echocardiogram 06/2010   normal  . Palpitations     Past Surgical History:  Procedure Laterality Date  . DENTAL SURGERY      Social History   Social History  . Marital status: Single    Spouse name: N/A  . Number of children: N/A  . Years of education: N/A   Occupational History  . [employer]    Social History Main Topics  . Smoking status: Never Smoker  . Smokeless tobacco: Never Used  . Alcohol use No  . Drug use: No  . Sexual activity:  Yes    Birth control/ protection: Injection   Other Topics Concern  . Not on file   Social History Narrative   Lives with 2 children in an apartment on the second floor.     Works at [employer].  Education: high school.    Family History  Problem Relation Age of Onset  . Hypertension Other   . CAD Other   . Diabetes Maternal Grandmother   . Breast cancer Maternal Grandmother   . Healthy Son   . Healthy Sister     x 6  . Healthy Sister     x 2    ROS: no fevers or chills, productive cough, hemoptysis, dysphasia, odynophagia, melena, hematochezia, dysuria, hematuria, rash, seizure activity, orthopnea, PND, pedal edema, claudication. Remaining systems are negative.  Physical Exam: Well-developed well-nourished anxious in no acute distress.  Skin is warm and dry.  HEENT is normal.  Neck is supple.  Chest is clear to auscultation with normal expansion.  Cardiovascular exam is regular rate and rhythm.  Abdominal exam nontender or distended. No masses palpated. Extremities show no edema. neuro grossly intact  ECG-Sinus tachycardia at a rate of 128. Nonspecific ST changes.  A/P  1 Palpitations-patient continues to have palpitations. Previous monitors showed sinus to sinus tachycardia with PACs. Her heart rate is elevated today and she was having palpitations with her sinus tachycardia. Continue Toprol  50 mg daily. She will take an additional 25 mg daily for worsening symptoms. Check TSH and hemoglobin for tachycardia. Anxiety likely also contributing to tachycardia.  2 anxiety-I have asked her to fu with primary care for med adjustment; this is likely contributing to her tachycardia.  Olga MillersBrian Yuval Rubens, MD

## 2016-10-06 ENCOUNTER — Ambulatory Visit (INDEPENDENT_AMBULATORY_CARE_PROVIDER_SITE_OTHER): Payer: BLUE CROSS/BLUE SHIELD | Admitting: Cardiology

## 2016-10-06 ENCOUNTER — Encounter: Payer: Self-pay | Admitting: Cardiology

## 2016-10-06 VITALS — BP 128/70 | HR 128 | Ht 65.0 in | Wt 118.0 lb

## 2016-10-06 DIAGNOSIS — F419 Anxiety disorder, unspecified: Secondary | ICD-10-CM | POA: Diagnosis not present

## 2016-10-06 DIAGNOSIS — R002 Palpitations: Secondary | ICD-10-CM | POA: Diagnosis not present

## 2016-10-06 LAB — CBC
HCT: 41 % (ref 35.0–45.0)
Hemoglobin: 13.5 g/dL (ref 11.7–15.5)
MCH: 31.4 pg (ref 27.0–33.0)
MCHC: 32.9 g/dL (ref 32.0–36.0)
MCV: 95.3 fL (ref 80.0–100.0)
MPV: 9.6 fL (ref 7.5–12.5)
Platelets: 268 10*3/uL (ref 140–400)
RBC: 4.3 MIL/uL (ref 3.80–5.10)
RDW: 13 % (ref 11.0–15.0)
WBC: 5.1 10*3/uL (ref 3.8–10.8)

## 2016-10-06 LAB — TSH: TSH: 1.62 mIU/L

## 2016-10-06 MED ORDER — METOPROLOL SUCCINATE ER 25 MG PO TB24: ORAL_TABLET | ORAL | 11 refills | Status: DC

## 2016-10-06 NOTE — Patient Instructions (Signed)
Medication Instructions:   TAKE AN EXTRA 25 MG OF METOPROLOL ONCE DAILY AS NEEDED FOR PALPITATIONS  Labwork:  Your physician recommends that you HAVE LAB WORK TODAY  Follow-Up:  Your physician wants you to follow-up in: 6 MONTHS WITH DR Jens SomRENSHAW You will receive a reminder letter in the mail two months in advance. If you don't receive a letter, please call our office to schedule the follow-up appointment.   If you need a refill on your cardiac medications before your next appointment, please call your pharmacy.

## 2016-10-09 ENCOUNTER — Telehealth: Payer: Self-pay | Admitting: Advanced Practice Midwife

## 2016-10-09 ENCOUNTER — Ambulatory Visit: Payer: BLUE CROSS/BLUE SHIELD | Admitting: Advanced Practice Midwife

## 2016-10-09 NOTE — Telephone Encounter (Signed)
Pt just wanted to know if she went off her Depo shot and decided she wanted to get an IUD would that be possible.  Informed pt yes, she would need to be sure and use protection in the mean time if not ready to be pregnant and would need an appointment to discuss the IUD before having it inserted.  Pt verbalized understanding.

## 2016-11-03 ENCOUNTER — Ambulatory Visit: Payer: BLUE CROSS/BLUE SHIELD

## 2017-04-02 ENCOUNTER — Ambulatory Visit: Payer: BLUE CROSS/BLUE SHIELD | Admitting: Student

## 2017-04-15 NOTE — Progress Notes (Deleted)
      HPI: FU palpitations. Patient had an echocardiogram in October of 2011. Her ejection fraction was 65-70% and there was grade 1 diastolic dysfunction. There was no significant Doppler abnormality. Previous monitor in October of 2011 showed no significant arrhythmias. Cardionet 3/13 revealed sinus to sinus tachycardia with PACs. Repeat 3/16 showed sinus with pacs. Since last seen,   Current Outpatient Prescriptions  Medication Sig Dispense Refill  . acetaminophen (TYLENOL) 500 MG tablet Take 1,000 mg by mouth every 6 (six) hours as needed for mild pain.    Marland Kitchen. KLONOPIN 0.5 MG tablet Take 0.5 mg by mouth daily as needed for anxiety.     . medroxyPROGESTERone (DEPO-PROVERA) 150 MG/ML injection BRING TO MD OFFICE AS DIRECTED 1 mL 3  . metoprolol succinate (TOPROL-XL) 25 MG 24 hr tablet Take 2 tablets once daily and 1 tablet as needed for palpitations 90 tablet 11  . Omega-3 Fatty Acids (FISH OIL PO) Take 2,000 mg by mouth daily.    . Vitamin D, Cholecalciferol, 400 units CAPS Take 1 capsule by mouth every morning.     No current facility-administered medications for this visit.      Past Medical History:  Diagnosis Date  . Anxiety   . GERD (gastroesophageal reflux disease)   . Heart murmur   . History of Holter monitoring 06/2010 and 11/2011   Cardionet montior with sinus tachycardia, PAC's only, no arrhythmias either monitor  . Hx of echocardiogram 06/2010   normal  . Palpitations     Past Surgical History:  Procedure Laterality Date  . DENTAL SURGERY      Social History   Social History  . Marital status: Single    Spouse name: N/A  . Number of children: N/A  . Years of education: N/A   Occupational History  . [employer]    Social History Main Topics  . Smoking status: Never Smoker  . Smokeless tobacco: Never Used  . Alcohol use No  . Drug use: No  . Sexual activity: Yes    Birth control/ protection: Injection   Other Topics Concern  . Not on file   Social  History Narrative   Lives with 2 children in an apartment on the second floor.     Works at [employer].  Education: high school.    Family History  Problem Relation Age of Onset  . Hypertension Other   . CAD Other   . Diabetes Maternal Grandmother   . Breast cancer Maternal Grandmother   . Healthy Son   . Healthy Sister        x 6  . Healthy Sister        x 2    ROS: no fevers or chills, productive cough, hemoptysis, dysphasia, odynophagia, melena, hematochezia, dysuria, hematuria, rash, seizure activity, orthopnea, PND, pedal edema, claudication. Remaining systems are negative.  Physical Exam: Well-developed well-nourished in no acute distress.  Skin is warm and dry.  HEENT is normal.  Neck is supple.  Chest is clear to auscultation with normal expansion.  Cardiovascular exam is regular rate and rhythm.  Abdominal exam nontender or distended. No masses palpated. Extremities show no edema. neuro grossly intact  ECG- personally reviewed  A/P  1  Olga MillersBrian Michah Minton, MD

## 2017-04-16 ENCOUNTER — Ambulatory Visit: Payer: BLUE CROSS/BLUE SHIELD | Admitting: Cardiology

## 2017-06-22 NOTE — Progress Notes (Deleted)
      HPI: FU palpitations. Patient had an echocardiogram in October of 2011. Her ejection fraction was 65-70% and there was grade 1 diastolic dysfunction. There was no significant Doppler abnormality. Previous monitor in October of 2011 showed no significant arrhythmias. Cardionet 3/13 revealed sinus to sinus tachycardia with PACs. Repeat 3/16 showed sinus with pacs. Since last seen,   Current Outpatient Prescriptions  Medication Sig Dispense Refill  . acetaminophen (TYLENOL) 500 MG tablet Take 1,000 mg by mouth every 6 (six) hours as needed for mild pain.    . KLONOPIN 0.5 MG tablet Take 0.5 mg by mouth daily as needed for anxiety.     . medroxyPROGESTERone (DEPO-PROVERA) 150 MG/ML injection BRING TO MD OFFICE AS DIRECTED 1 mL 3  . metoprolol succinate (TOPROL-XL) 25 MG 24 hr tablet Take 2 tablets once daily and 1 tablet as needed for palpitations 90 tablet 11  . Omega-3 Fatty Acids (FISH OIL PO) Take 2,000 mg by mouth daily.    . Vitamin D, Cholecalciferol, 400 units CAPS Take 1 capsule by mouth every morning.     No current facility-administered medications for this visit.      Past Medical History:  Diagnosis Date  . Anxiety   . GERD (gastroesophageal reflux disease)   . Heart murmur   . History of Holter monitoring 06/2010 and 11/2011   Cardionet montior with sinus tachycardia, PAC's only, no arrhythmias either monitor  . Hx of echocardiogram 06/2010   normal  . Palpitations     Past Surgical History:  Procedure Laterality Date  . DENTAL SURGERY      Social History   Social History  . Marital status: Single    Spouse name: N/A  . Number of children: N/A  . Years of education: N/A   Occupational History  . [employer]    Social History Main Topics  . Smoking status: Never Smoker  . Smokeless tobacco: Never Used  . Alcohol use No  . Drug use: No  . Sexual activity: Yes    Birth control/ protection: Injection   Other Topics Concern  . Not on file   Social  History Narrative   Lives with 2 children in an apartment on the second floor.     Works at [employer].  Education: high school.    Family History  Problem Relation Age of Onset  . Hypertension Other   . CAD Other   . Diabetes Maternal Grandmother   . Breast cancer Maternal Grandmother   . Healthy Son   . Healthy Sister        x 6  . Healthy Sister        x 2    ROS: no fevers or chills, productive cough, hemoptysis, dysphasia, odynophagia, melena, hematochezia, dysuria, hematuria, rash, seizure activity, orthopnea, PND, pedal edema, claudication. Remaining systems are negative.  Physical Exam: Well-developed well-nourished in no acute distress.  Skin is warm and dry.  HEENT is normal.  Neck is supple.  Chest is clear to auscultation with normal expansion.  Cardiovascular exam is regular rate and rhythm.  Abdominal exam nontender or distended. No masses palpated. Extremities show no edema. neuro grossly intact  ECG- personally reviewed  A/P  1  Nell Gales, MD    

## 2017-07-06 ENCOUNTER — Ambulatory Visit: Payer: BLUE CROSS/BLUE SHIELD | Admitting: Cardiology

## 2017-07-09 NOTE — Progress Notes (Deleted)
      HPI: FU palpitations. Patient had an echocardiogram in October of 2011. Her ejection fraction was 65-70% and there was grade 1 diastolic dysfunction. There was no significant Doppler abnormality. Previous monitor in October of 2011 showed no significant arrhythmias. Cardionet 3/13 revealed sinus to sinus tachycardia with PACs. Repeat 3/16 showed sinus with pacs. TSH and hemoglobin normal January 2018. Since last seen,   Current Outpatient Prescriptions  Medication Sig Dispense Refill  . acetaminophen (TYLENOL) 500 MG tablet Take 1,000 mg by mouth every 6 (six) hours as needed for mild pain.    Marland Kitchen. KLONOPIN 0.5 MG tablet Take 0.5 mg by mouth daily as needed for anxiety.     . medroxyPROGESTERone (DEPO-PROVERA) 150 MG/ML injection BRING TO MD OFFICE AS DIRECTED 1 mL 3  . metoprolol succinate (TOPROL-XL) 25 MG 24 hr tablet Take 2 tablets once daily and 1 tablet as needed for palpitations 90 tablet 11  . Omega-3 Fatty Acids (FISH OIL PO) Take 2,000 mg by mouth daily.    . Vitamin D, Cholecalciferol, 400 units CAPS Take 1 capsule by mouth every morning.     No current facility-administered medications for this visit.      Past Medical History:  Diagnosis Date  . Anxiety   . GERD (gastroesophageal reflux disease)   . Heart murmur   . History of Holter monitoring 06/2010 and 11/2011   Cardionet montior with sinus tachycardia, PAC's only, no arrhythmias either monitor  . Hx of echocardiogram 06/2010   normal  . Palpitations     Past Surgical History:  Procedure Laterality Date  . DENTAL SURGERY      Social History   Social History  . Marital status: Single    Spouse name: N/A  . Number of children: N/A  . Years of education: N/A   Occupational History  . [employer]    Social History Main Topics  . Smoking status: Never Smoker  . Smokeless tobacco: Never Used  . Alcohol use No  . Drug use: No  . Sexual activity: Yes    Birth control/ protection: Injection   Other Topics  Concern  . Not on file   Social History Narrative   Lives with 2 children in an apartment on the second floor.     Works at [employer].  Education: high school.    Family History  Problem Relation Age of Onset  . Hypertension Other   . CAD Other   . Diabetes Maternal Grandmother   . Breast cancer Maternal Grandmother   . Healthy Son   . Healthy Sister        x 6  . Healthy Sister        x 2    ROS: no fevers or chills, productive cough, hemoptysis, dysphasia, odynophagia, melena, hematochezia, dysuria, hematuria, rash, seizure activity, orthopnea, PND, pedal edema, claudication. Remaining systems are negative.  Physical Exam: Well-developed well-nourished in no acute distress.  Skin is warm and dry.  HEENT is normal.  Neck is supple.  Chest is clear to auscultation with normal expansion.  Cardiovascular exam is regular rate and rhythm.  Abdominal exam nontender or distended. No masses palpated. Extremities show no edema. neuro grossly intact  ECG- personally reviewed  A/P  1  Olga MillersBrian Arul Farabee, MD

## 2017-07-17 ENCOUNTER — Ambulatory Visit: Payer: BLUE CROSS/BLUE SHIELD | Admitting: Cardiology

## 2017-07-31 ENCOUNTER — Telehealth: Payer: Self-pay | Admitting: Cardiology

## 2017-07-31 NOTE — Telephone Encounter (Signed)
If no problem from OBGYN (please check with them), can continue from my standpoint Olga MillersBrian Nayely Dingus

## 2017-07-31 NOTE — Telephone Encounter (Signed)
Pt called and states that her PCP just told her that she is pregnant and stats is it ok to take metoprolol while pregnant she states that she will call GYN also but would like your direction instead.

## 2017-07-31 NOTE — Telephone Encounter (Signed)
New message    Patient calling to confirm that she is pregnant. Wants to confirm medications are safe.  Please call

## 2017-07-31 NOTE — Telephone Encounter (Signed)
Left detailed message on dr Jens Somcrenshaw direction and to make sure to call OBGYN for further direction

## 2017-08-11 ENCOUNTER — Emergency Department (HOSPITAL_COMMUNITY): Payer: BLUE CROSS/BLUE SHIELD

## 2017-08-11 ENCOUNTER — Other Ambulatory Visit: Payer: Self-pay

## 2017-08-11 ENCOUNTER — Emergency Department (HOSPITAL_COMMUNITY)
Admission: EM | Admit: 2017-08-11 | Discharge: 2017-08-11 | Disposition: A | Discharge status: Home / Self Care | Payer: BLUE CROSS/BLUE SHIELD | Attending: Emergency Medicine | Admitting: Emergency Medicine

## 2017-08-11 ENCOUNTER — Encounter (HOSPITAL_COMMUNITY): Payer: Self-pay

## 2017-08-11 DIAGNOSIS — R000 Tachycardia, unspecified: Secondary | ICD-10-CM | POA: Diagnosis not present

## 2017-08-11 DIAGNOSIS — Z79899 Other long term (current) drug therapy: Secondary | ICD-10-CM | POA: Diagnosis not present

## 2017-08-11 DIAGNOSIS — R002 Palpitations: Secondary | ICD-10-CM | POA: Diagnosis present

## 2017-08-11 DIAGNOSIS — F419 Anxiety disorder, unspecified: Secondary | ICD-10-CM | POA: Diagnosis not present

## 2017-08-11 LAB — CBC WITH DIFFERENTIAL/PLATELET
Basophils Absolute: 0 10*3/uL (ref 0.0–0.1)
Basophils Relative: 0 %
Eosinophils Absolute: 0 10*3/uL (ref 0.0–0.7)
Eosinophils Relative: 0 %
HCT: 37.1 % (ref 36.0–46.0)
Hemoglobin: 12.3 g/dL (ref 12.0–15.0)
Lymphocytes Relative: 24 %
Lymphs Abs: 1.9 10*3/uL (ref 0.7–4.0)
MCH: 31.5 pg (ref 26.0–34.0)
MCHC: 33.2 g/dL (ref 30.0–36.0)
MCV: 94.9 fL (ref 78.0–100.0)
Monocytes Absolute: 0.4 10*3/uL (ref 0.1–1.0)
Monocytes Relative: 4 %
Neutro Abs: 5.6 10*3/uL (ref 1.7–7.7)
Neutrophils Relative %: 72 %
Platelets: 286 10*3/uL (ref 150–400)
RBC: 3.91 MIL/uL (ref 3.87–5.11)
RDW: 12.5 % (ref 11.5–15.5)
WBC: 7.9 10*3/uL (ref 4.0–10.5)

## 2017-08-11 LAB — BASIC METABOLIC PANEL
Anion gap: 9 (ref 5–15)
BUN: 10 mg/dL (ref 6–20)
CO2: 21 mmol/L — ABNORMAL LOW (ref 22–32)
Calcium: 9 mg/dL (ref 8.9–10.3)
Chloride: 104 mmol/L (ref 101–111)
Creatinine, Ser: 0.54 mg/dL (ref 0.44–1.00)
GFR calc Af Amer: >60 mL/min (ref >60)
GFR calc non Af Amer: >60 mL/min (ref >60)
Glucose, Bld: 89 mg/dL (ref 65–99)
Potassium: 3 mmol/L — ABNORMAL LOW (ref 3.5–5.1)
Sodium: 134 mmol/L — ABNORMAL LOW (ref 135–145)

## 2017-08-11 LAB — I-STAT TROPONIN, ED: Troponin i, poc: 0 ng/mL (ref 0.00–0.08)

## 2017-08-11 LAB — D-DIMER, QUANTITATIVE: D-Dimer, Quant: 3.63 ug/mL-FEU — ABNORMAL HIGH (ref 0.00–0.50)

## 2017-08-11 MED ORDER — SODIUM CHLORIDE 0.9 % IV BOLUS (SEPSIS)
1000.0000 mL | Freq: Once | INTRAVENOUS | Status: AC
  Administered 2017-08-11: 1000 mL via INTRAVENOUS

## 2017-08-11 MED ORDER — LORAZEPAM 2 MG/ML IJ SOLN: 1.0000 mg | Freq: Once | INTRAMUSCULAR | Status: DC

## 2017-08-11 MED ORDER — LORAZEPAM 2 MG/ML IJ SOLN
0.5000 mg | Freq: Once | INTRAMUSCULAR | Status: AC
  Administered 2017-08-11: 0.5 mg via INTRAVENOUS
  Filled 2017-08-11: qty 1

## 2017-08-11 MED ORDER — IOPAMIDOL (ISOVUE-370) INJECTION 76%
100.0000 mL | Freq: Once | INTRAVENOUS | Status: AC | PRN
  Administered 2017-08-11: 100 mL via INTRAVENOUS

## 2017-08-11 NOTE — ED Triage Notes (Addendum)
Pt reports feels like heart is racing and feels weak.  Reports she had an abortion Saturday and had an episode of weakness and feeling like heart was racing afterwards.  Pt takes metoprolol daily.

## 2017-08-11 NOTE — ED Provider Notes (Signed)
[facility] EMERGENCY DEPARTMENT Provider Note   CSN: [id number] Arrival date & time: 08/11/17  1353     History   Chief Complaint Chief Complaint  Patient presents with  . Tachycardia    HPI Paula Riley is a 36 y.o. female.  HPI Presents to the emergency room for evaluation of palpitations.  Patient has a history of sinus tachycardia.  She has been evaluated by cardiology in the past.  He takes metoprolol.  Patient recently had an elective abortion.  This was done over the weekend.  Patient is having some vaginal bleeding but denies blood loss.  She denies any abdominal pain.  She is not having any fevers or chills.  She started noticing her heart racing today and she felt lightheaded and weak.  She states she felt sort of like she might pass out but not exactly.  She denies any trouble with any chest pain or shortness of breath.  She says this episode does not exactly feel like prior episodes. Past Medical History:  Diagnosis Date  . Anxiety   . GERD (gastroesophageal reflux disease)   . Heart murmur   . History of Holter monitoring 06/2010 and 11/2011   Cardionet montior with sinus tachycardia, PAC's only, no arrhythmias either monitor  . Hx of echocardiogram 06/2010   normal  . Palpitations     Patient Active Problem List   Diagnosis Date Noted  . Anxiety 05/03/2015  . Sinus tachycardia 08/28/2014  . Cervical high risk HPV (human papillomavirus) test positive 05/30/2014  . Palpitations 12/02/2011  . GERD (gastroesophageal reflux disease) 11/13/2011  . Abdominal pain, other specified site 11/13/2011    Past Surgical History:  Procedure Laterality Date  . DENTAL SURGERY    . INDUCED ABORTION      OB History    Gravida Para Term Preterm AB Living   3 2 2   1 2    SAB TAB Ectopic Multiple Live Births           2       Home Medications    Prior to Admission medications   Medication Sig Start Date End Date Taking? Authorizing Provider  acetaminophen  (TYLENOL) 500 MG tablet Take 1,000 mg by mouth every 6 (six) hours as needed for mild pain.    [provider]  KLONOPIN 0.5 MG tablet Take 0.5 mg by mouth daily as needed for anxiety.  09/24/14   [provider]  medroxyPROGESTERone (DEPO-PROVERA) 150 MG/ML injection BRING TO MD OFFICE AS DIRECTED 07/31/16   Adline PotterGriffin, Jennifer A, NP  metoprolol succinate (TOPROL-XL) 25 MG 24 hr tablet Take 2 tablets once daily and 1 tablet as needed for palpitations 10/06/16   Lewayne Buntingrenshaw, Brian S, MD  Omega-3 Fatty Acids (FISH OIL PO) Take 2,000 mg by mouth daily.    [provider]  Vitamin D, Cholecalciferol, 400 units CAPS Take 1 capsule by mouth every morning.    [provider]    Family History Family History  Problem Relation Age of Onset  . Hypertension Other   . CAD Other   . Diabetes Maternal Grandmother   . Breast cancer Maternal Grandmother   . Healthy Son   . Healthy Sister        x 6  . Healthy Sister        x 2    Social History Social History   Tobacco Use  . Smoking status: Never Smoker  . Smokeless tobacco: Never Used  Substance Use Topics  .  Alcohol use: No    Alcohol/week: 0.0 oz  . Drug use: No     Allergies   Patient has no known allergies.   Review of Systems Review of Systems  Constitutional: Negative for fever.  HENT: Negative for trouble swallowing.   Eyes: Negative for photophobia.  Respiratory: Negative for chest tightness and shortness of breath.   Cardiovascular: Negative for chest pain.  Gastrointestinal: Negative for abdominal distention, abdominal pain and blood in stool.  Genitourinary: Negative for dysuria.  Musculoskeletal: Negative for back pain.  Neurological: Positive for dizziness. Negative for syncope and speech difficulty.  Psychiatric/Behavioral: Negative for confusion.  All other systems reviewed and are negative.    Physical Exam Updated Vital Signs BP (!) 146/81   Pulse (!) 123   Temp 98.3 F (36.8  C) (Oral)   Resp (!) 23   Ht 1.651 m (5\' 5" )   Wt 51.7 kg (114 lb)   SpO2 100%   BMI 18.97 kg/m   Physical Exam  Constitutional: She appears well-developed and well-nourished. No distress.  HENT:  Head: Normocephalic and atraumatic.  Right Ear: External ear normal.  Left Ear: External ear normal.  Eyes: Conjunctivae are normal. Right eye exhibits no discharge. Left eye exhibits no discharge. No scleral icterus.  Neck: Neck supple. No tracheal deviation present. No thyromegaly present.  Cardiovascular: Regular rhythm and intact distal pulses. Tachycardia present.  Pulmonary/Chest: Effort normal and breath sounds normal. No stridor. No respiratory distress. She has no wheezes. She has no rales.  Abdominal: Soft. Bowel sounds are normal. She exhibits no distension. There is no tenderness. There is no rebound and no guarding.  Musculoskeletal: She exhibits no edema or tenderness.  Neurological: She is alert. She has normal strength. No cranial nerve deficit (no facial droop, extraocular movements intact, no slurred speech) or sensory deficit. She exhibits normal muscle tone. She displays no seizure activity. Coordination normal.  Skin: Skin is warm and dry. No rash noted.  Psychiatric: She has a normal mood and affect.  Nursing note and vitals reviewed.    ED Treatments / Results  Labs (all labs ordered are listed, but only abnormal results are displayed) Labs Reviewed  BASIC METABOLIC PANEL - Abnormal; Notable for the following components:      Result Value   Sodium 134 (*)    Potassium 3.0 (*)    CO2 21 (*)    All other components within normal limits  D-DIMER, QUANTITATIVE (NOT AT [facility]) - Abnormal; Notable for the following components:   D-Dimer, Quant 3.63 (*)    All other components within normal limits  CBC WITH DIFFERENTIAL/PLATELET  I-STAT TROPONIN, ED    EKG  EKG Interpretation  Date/Time:  Tuesday August 11 2017 14:07:27 EST Ventricular Rate:  122 PR  Interval:    QRS Duration: 74 QT Interval:  309 QTC Calculation: 441 R Axis:   69 Text Interpretation:  Sinus tachycardia Confirmed by Linwood DibblesKnapp, Carlosdaniel Grob [phone]) on 08/11/2017 2:12:44 PM       Radiology Ct Angio Chest Pe W And/or Wo Contrast  Result Date: 08/11/2017 CLINICAL DATA:  Sensation of heart racing today.  Weakness. EXAM: CT ANGIOGRAPHY CHEST WITH CONTRAST TECHNIQUE: Multidetector CT imaging of the chest was performed using the standard protocol during bolus administration of intravenous contrast. Multiplanar CT image reconstructions and MIPs were obtained to evaluate the vascular anatomy. CONTRAST:  100 ml ISOVUE-370 IOPAMIDOL (ISOVUE-370) INJECTION 76% COMPARISON:  None. FINDINGS: Cardiovascular: No pulmonary embolus is identified. Heart size normal. No pericardial  effusion. No atherosclerotic vascular disease is seen. Mediastinum/Nodes: No enlarged mediastinal, hilar, or axillary lymph nodes. Thyroid gland, trachea, and esophagus demonstrate no significant findings. Lungs/Pleura: Lungs are clear. No pleural effusion or pneumothorax. Upper Abdomen: Negative. Musculoskeletal: Negative. Review of the MIP images confirms the above findings. IMPRESSION: Negative for pulmonary embolus.  Normal chest CT. Electronically Signed   By: Drusilla Kanner M.D.   On: 08/11/2017 15:45    Procedures Procedures (including critical care time)  Medications Ordered in ED Medications  sodium chloride 0.9 % bolus 1,000 mL (0 mLs Intravenous Stopped 08/11/17 1525)  iopamidol (ISOVUE-370) 76 % injection 100 mL (100 mLs Intravenous Contrast Given 08/11/17 1522)  LORazepam (ATIVAN) injection 0.5 mg (0.5 mg Intravenous Given 08/11/17 1607)     Initial Impression / Assessment and Plan / ED Course  I have reviewed the triage vital signs and the nursing notes.  Pertinent labs & imaging results that were available during my care of the patient were reviewed by me and considered in my medical decision making (see  chart for details).   Patient presented to the emergency room for evaluation of tachycardia and weakness.  Patient does have a history of palpitations.  She also has a history of anxiety.  Her laboratory tests were notable for an elevated d-dimer.  Considering her recent surgical procedure I ordered a CT angios.  CT scan is reassuring.  No evidence of pulmonary embolism.  During the ED stay the patient definitely had episodes of anxiety.  When I discussed the possibility of a pulmonary embolism and the reason for doing the CT scan she became visibly more tachycardic and tremulous.  I did give the patient half dose of Ativan.  Recommend she follow-up with her primary doctor.  Consider further treatment for anxiety.  Final Clinical Impressions(s) / ED Diagnoses   Final diagnoses:  Sinus tachycardia  Anxiety    ED Discharge Orders    None       Linwood Dibbles, MD 08/11/17 [phone]

## 2017-08-11 NOTE — Discharge Instructions (Signed)
Continue your current medications, follow up with your primary care doctor to discuss further treatment

## 2017-08-11 NOTE — ED Notes (Signed)
Pt requesting another dose of ativan.  EDP aware.

## 2017-08-18 NOTE — Progress Notes (Signed)
Cardiology Office Note    Date:  08/19/2017   ID:  Paula Riley, DOB 04/12/1981, MRN [id number]  PCP:  Paula Riley, Paula Riley, Paula Riley  Cardiologist: Dr. Jens Somrenshaw   Chief Complaint  Patient presents with  . Follow-up    worsening palpitations    History of Present Illness:    Paula Bearawanda Yeley is a 36 y.o. female with past medical history of palpitations (PAC's by prior event monitor) and GERD who presents to the office today for evaluation of palpitations.   She was last examined by Dr. Jens Somrenshaw in 09/2016 and reported having episodes of palpitations occurring while at work. She was continued on Toprol-XL 50mg  daily with instructions to take an additional  25mg  for worsening symptoms. It was thought anxiety was likely playing a significant factor in her symptoms.    She was recently [facility] on 08/11/2017 and reported frequent palpitations following an elective abortion. Reported some vaginal bleeding but denied any significant blood loss. She reported feeling her heart rate was elevated along with associated weakness and dizziness. EKG showed sinus tachycardia with HR of 122. Labs showed a stable Hgb of 12.3, K+ was low at 3.0, creatinine 0.54, and initial troponin negative. D-dimer was elevated to 3.63, therefore a CTA was ordered and showed no evidence of a PE. She was discharged and informed to follow-up with her PCP for further evaluation of her anxiety.   In talking with the patient today, she reports having episodes of palpitations occurring regularly over the past few weeks. Prior to this she only noticed the symptoms every few months.She was prescribed Escitalopram by her PCP and reports only taking this 1 day as it caused her to have fatigue. She has since discontinued the medication and noticed that her symptoms of palpitations have worsened. She denies any associated lightheadedness, dizziness, or presyncope. No recent chest discomfort or dyspnea on exertion.  She is  accompanied by her sister and aunt today who are very concerned about her anxiety. The patient is visibly shaking during the encounter and she reports this occurs on a regular basis. She takes her Toprol-XL 50mg  once daily and denies any noted side-effects to the medication.    Past Medical History:  Diagnosis Date  . Anxiety   . GERD (gastroesophageal reflux disease)   . Heart murmur   . History of Holter monitoring 06/2010 and 11/2011   Cardionet montior with sinus tachycardia, PAC's only, no arrhythmias either monitor  . Hx of echocardiogram 06/2010   normal  . Palpitations     Past Surgical History:  Procedure Laterality Date  . DENTAL SURGERY    . INDUCED ABORTION      Current Medications: Outpatient Medications Prior to Visit  Medication Sig Dispense Refill  . acetaminophen (TYLENOL) 500 MG tablet Take 1,000 mg by mouth every 6 (six) hours as needed for mild pain.    Marland Kitchen. escitalopram (LEXAPRO) 10 MG tablet Take 1 tablet by mouth every morning.  1  . KLONOPIN 0.5 MG tablet Take 0.5 mg by mouth 3 (three) times daily as needed for anxiety.     . Omega-3 Fatty Acids (FISH OIL PO) Take 2,000 mg by mouth daily.    . Vitamin D, Ergocalciferol, (DRISDOL) 50000 units CAPS capsule Take 1 capsule by mouth once a week.  0  . metoprolol succinate (TOPROL-XL) 25 MG 24 hr tablet Take 2 tablets once daily and 1 tablet as needed for palpitations (Patient taking differently: Take 50 mg by mouth  daily. ) 90 tablet 11   No facility-administered medications prior to visit.      Allergies:   Patient has no known allergies.   Social History   Socioeconomic History  . Marital status: Single    Spouse name: None  . Number of children: None  . Years of education: None  . Highest education level: None  Social Needs  . Financial resource strain: None  . Food insecurity - worry: None  . Food insecurity - inability: None  . Transportation needs - medical: None  . Transportation needs -  non-medical: None  Occupational History  . Occupation: [employer]  Tobacco Use  . Smoking status: Never Smoker  . Smokeless tobacco: Never Used  Substance and Sexual Activity  . Alcohol use: No    Alcohol/week: 0.0 oz  . Drug use: No  . Sexual activity: Yes    Birth control/protection: Injection  Other Topics Concern  . None  Social History Narrative   Lives with 2 children in an apartment on the second floor.     Works at [employer].  Education: high school.     Family History:  The patient's family history includes Breast cancer in her maternal grandmother; CAD in her other; Diabetes in her maternal grandmother; Healthy in her sister, sister, and son; Hypertension in her other.   Review of Systems:   Please see the history of present illness.     General:  No chills, fever, night sweats or weight changes. Positive for anxiety.  Cardiovascular:  No chest pain, dyspnea on exertion, edema, orthopnea, paroxysmal nocturnal dyspnea. Positive for palpitations.  Dermatological: No rash, lesions/masses Respiratory: No cough, dyspnea Urologic: No hematuria, dysuria Abdominal:   No nausea, vomiting, diarrhea, bright red blood per rectum, melena, or hematemesis Neurologic:  No visual changes, wkns, changes in mental status. All other systems reviewed and are otherwise negative except as noted above.   Physical Exam:    VS:  BP 138/82   Pulse (!) 105   Ht 5\' 5"  (1.651 m)   Wt 110 lb 9.6 oz (50.2 kg)   BMI 18.40 kg/m    General: Well developed, thin African American female appearing in no acute distress. Head: Normocephalic, atraumatic, sclera non-icteric, no xanthomas, nares are without discharge.  Neck: No carotid bruits. JVD not elevated.  Lungs: Respirations regular and unlabored, without wheezes or rales.  Heart: Regular rhythm with tachycardiac rate. No S3 or S4.  No murmur, no rubs, or gallops appreciated. Abdomen: Soft, non-tender, non-distended with normoactive bowel sounds. No  hepatomegaly. No rebound/guarding. No obvious abdominal masses. Msk:  Strength and tone appear normal for age. No joint deformities or effusions. Extremities: No clubbing or cyanosis. No lower extremity edema. Distal pedal pulses are 2+ bilaterally. Neuro: Alert and oriented X 3. Moves all extremities spontaneously. No focal deficits noted. Psych:  Responds to questions appropriately with a normal affect. Skin: No rashes or lesions noted  Wt Readings from Last 3 Encounters:  08/19/17 110 lb 9.6 oz (50.2 kg)  08/11/17 114 lb (51.7 kg)  10/06/16 118 lb (53.5 kg)     Studies/Labs Reviewed:   EKG:  EKG is ordered today. The ekg ordered today demonstrates sinus tachycardia, HR 105, with no acute ST or T-wave changes when compared to prior changes.   Recent Labs: 10/06/2016: TSH 1.62 08/11/2017: BUN 10; Creatinine, Ser 0.54; Hemoglobin 12.3; Platelets 286; Potassium 3.0; Sodium 134   Lipid Panel No results found for: CHOL, TRIG, HDL, CHOLHDL, VLDL, LDLCALC, LDLDIRECT  Additional studies/ records that were reviewed today include:   CTA: 08/11/2017 IMPRESSION: Negative for pulmonary embolus.  Normal chest CT.   Assessment:    1. Palpitations   2. Sinus tachycardia   3. Anxiety      Plan:   In order of problems listed above:  1. Palpitations/ Sinus Tachycardia - the patient has a known history of palpitations, thought to be related to her underlying anxiety. Prior event monitors have showed sinus tachycardia with PAC's. Recently evaluated in the ED for worsening symptoms and labs showed a stable Hgb of 12.3, K+ was low at 3.0 (rechecked by PCP since and WNL), creatinine 0.54, and initial troponin negative. D-dimer was elevated to 3.63, therefore a CTA was ordered and showed no evidence of a PE. She denies any recent caffeine use or alcohol use.  - she continues to report palpitations but denies any associated lightheadedness, dizziness, or presyncope. No recent chest discomfort or  dyspnea on exertion. - she is already on Toprol-XL 50mg  daily and while BP is well-controlled today, she does note episodes of occasional hypotension. I recommended she restart her anti-anxiety medication which was recently prescribed by her PCP and make them aware if unable to tolerate the medication, for we need to address the underlying issue of her tachycardia. She can take an extra half-tablet of Toprol-XL if having persistent palpitations.   2. Anxiety - this appears to be a major contributor to her tachycardia. She was prescribed Escitalopram by her PCP but only took this once due to it causing fatigue. I recommended she try taking the medication at nighttime to see if this lessens the side-effects. Recommended she follow-up with her PCP if symptoms persist and unable to tolerate the medication.    Medication Adjustments/Labs and Tests Ordered: Current medicines are reviewed at length with the patient today.  Concerns regarding medicines are outlined above.  Medication changes, Labs and Tests ordered today are listed in the Patient Instructions below. Patient Instructions  Medication Instructions:  METOPROLOL 50MG  DAILY MAY TAKE ADDITIONAL 1/2 TAB FOR PERSISTENT PALPITATIONS IF PALPITATIONS CONTINUE >30 MINUTES MAY TAKE ADDITIONAL 1/2 TABLET. If you need a refill on your cardiac medications before your next appointment, please call your pharmacy.  Follow-Up: Your physician wants you to follow-up in: 12 MONTHS WITH DR CRENSHAW. You should receive a reminder letter in the mail two months in advance. If you do not receive a letter, please call our office October 2019 to schedule the December 2019 follow-up appointment.   Thank you for choosing [facility] at [facility]!!       Signed, Paula LennoxBrittany M Maira Christon, Paula Riley  08/19/2017 4:32 PM    [facility] [address], [address] [place], [place]  [address] Phone: [phone]; Fax: [phone]) [phone]  [address],  [address] [place], [place] [address] Phone: [phone]

## 2017-08-19 ENCOUNTER — Encounter: Payer: Self-pay | Admitting: Student

## 2017-08-19 ENCOUNTER — Ambulatory Visit (INDEPENDENT_AMBULATORY_CARE_PROVIDER_SITE_OTHER): Payer: BLUE CROSS/BLUE SHIELD | Admitting: Student

## 2017-08-19 VITALS — BP 138/82 | HR 105 | Ht 65.0 in | Wt 110.6 lb

## 2017-08-19 DIAGNOSIS — R000 Tachycardia, unspecified: Secondary | ICD-10-CM | POA: Diagnosis not present

## 2017-08-19 DIAGNOSIS — F419 Anxiety disorder, unspecified: Secondary | ICD-10-CM

## 2017-08-19 DIAGNOSIS — R002 Palpitations: Secondary | ICD-10-CM

## 2017-08-19 MED ORDER — METOPROLOL SUCCINATE ER 50 MG PO TB24: 50.0000 mg | ORAL_TABLET | Freq: Every day | ORAL | 11 refills | Status: DC

## 2017-08-19 NOTE — Patient Instructions (Signed)
Medication Instructions:  METOPROLOL 50MG  DAILY MAY TAKE ADDITIONAL 1/2 TAB FOR PERSISTENT PALPITATIONS IF PALPITATIONS CONTINUE >30 MINUTES MAY TAKE ADDITIONAL 1/2 TABLET. If you need a refill on your cardiac medications before your next appointment, please call your pharmacy.  Follow-Up: Your physician wants you to follow-up in: 12 MONTHS WITH DR CRENSHAW. You should receive a reminder letter in the mail two months in advance. If you do not receive a letter, please call our office October 2019 to schedule the December 2019 follow-up appointment.   Thank you for choosing [facility]!!

## 2017-09-03 ENCOUNTER — Other Ambulatory Visit (HOSPITAL_COMMUNITY): Payer: Self-pay | Admitting: Family Medicine

## 2017-09-03 ENCOUNTER — Ambulatory Visit (HOSPITAL_COMMUNITY)
Admission: RE | Admit: 2017-09-03 | Discharge: 2017-09-03 | Disposition: A | Discharge status: Home / Self Care | Payer: BLUE CROSS/BLUE SHIELD | Source: Ambulatory Visit | Attending: Family Medicine | Admitting: Family Medicine

## 2017-09-03 DIAGNOSIS — G8929 Other chronic pain: Secondary | ICD-10-CM | POA: Diagnosis not present

## 2017-09-03 DIAGNOSIS — N83202 Unspecified ovarian cyst, left side: Secondary | ICD-10-CM | POA: Diagnosis not present

## 2017-09-03 DIAGNOSIS — N83201 Unspecified ovarian cyst, right side: Secondary | ICD-10-CM

## 2017-09-03 DIAGNOSIS — R102 Pelvic and perineal pain: Principal | ICD-10-CM

## 2017-09-10 ENCOUNTER — Ambulatory Visit: Payer: BLUE CROSS/BLUE SHIELD | Admitting: Obstetrics and Gynecology

## 2017-09-10 ENCOUNTER — Encounter: Payer: Self-pay | Admitting: Obstetrics and Gynecology

## 2017-09-10 VITALS — BP 122/68 | HR 80 | Ht 65.0 in | Wt 109.2 lb

## 2017-09-10 DIAGNOSIS — N83202 Unspecified ovarian cyst, left side: Secondary | ICD-10-CM

## 2017-09-10 NOTE — Progress Notes (Signed)
Patient ID: Paula Riley, female   DOB: 11/06/1980, 36 y.o.   MRN: [id number]   [facility]  @DATE @            Patient name: Paula Riley MRN [id number]  Date of birth: 07/25/1981  CC & HPI:  Paula Riley is a 36 y.o. female presenting today for a left sided adnexal mass that does not currently cause her any pain. She states that at one point it did cause her some discomfort which lead to her being seen by her PCP. She underwent an ultra sound.  IMPRESSION: 12/20 5.4 cm complex mass is noted in the left adnexal region. This may represent large hemorrhagic cyst, or possibly ovarian neoplasm. Short-interval follow up ultrasound in 6-12 weeks is recommended, preferably during the week following the patient's normal menses. If abnormality persists on followup ultrasound, then hemorrhagic cyst is unlikely and further evaluation with MRI is recommended.   Electronically Signed   By: Lupita RaiderJames  Green Jr, M.D.   On: 09/03/2017 14:05   She endorses vaginal bleeding that started this morning and states it is similar to her regular menstrual cycle. She is currently not on any birth control at the moment. She is not interested in having anymore children and is interested in an IUD. She denies fever, chills or any other symptoms at this time.   ROS:  ROS  +Left sided adnexal mass -fever -chills All systems are negative except as noted in the HPI and PMH.   Pertinent History Reviewed:   Reviewed: Medical         Past Medical History:  Diagnosis Date  . Anxiety   . GERD (gastroesophageal reflux disease)   . Heart murmur   . History of Holter monitoring 06/2010 and 11/2011   Cardionet montior with sinus tachycardia, PAC's only, no arrhythmias either monitor  . Hx of echocardiogram 06/2010   normal  . Palpitations                               Surgical Hx:    Past Surgical History:  Procedure Laterality Date  . DENTAL SURGERY    . INDUCED ABORTION     Medications:  Reviewed & Updated - see associated section                       Current Outpatient Medications:  .  acetaminophen (TYLENOL) 500 MG tablet, Take 1,000 mg by mouth every 6 (six) hours as needed for mild pain., Disp: , Rfl:  .  escitalopram (LEXAPRO) 10 MG tablet, Take 1 tablet by mouth every morning., Disp: , Rfl: 1 .  KLONOPIN 0.5 MG tablet, Take 0.5 mg by mouth 3 (three) times daily as needed for anxiety. , Disp: , Rfl:  .  metoprolol succinate (TOPROL-XL) 50 MG 24 hr tablet, Take 1 tablet (50 mg total) by mouth daily. MAY TAKE ADD1/2 TABLET DAILY-IF SX PERSIST AFTER 30MIN OK TO TAKE ANOTHER 1/2 TAB, Disp: 40 tablet, Rfl: 11 .  Omega-3 Fatty Acids (FISH OIL PO), Take 2,000 mg by mouth daily., Disp: , Rfl:  .  Vitamin D, Ergocalciferol, (DRISDOL) 50000 units CAPS capsule, Take 1 capsule by mouth once a week., Disp: , Rfl: 0   Social History: Reviewed -  reports that  has never smoked. she has never used smokeless tobacco.  Objective Findings:  Vitals: Blood pressure 122/68, pulse 80, height 5\' 5"  (1.651  m), weight 109 lb 3.2 oz (49.5 kg).  PHYSICAL EXAMINATION General appearance - alert, well appearing, and in no distress, oriented to person, place, and time and normal appearing weight Mental status - alert, oriented to person, place, and time, normal mood, behavior, speech, dress, motor activity, and thought processes, affect appropriate to mood  PELVIC External genitalia - normal Vagina - menstrual type dark blood Uterus - anterior, non-tender  Adnexa - no masses palpated on easy exam  Assessment & Plan:   A:  1. Left ovarian dermoid or hemorraghic cyst,m favor hemorrhagic cyst that is resolving 2. Contraception management, considering IUD after discussion of options  P:  1. Repeat Transvaginal U/S in 5 weeks   By signing my name below, I, Diona BrownerJennifer Gorman, attest that this documentation has been prepared under the direction and in the presence of Tilda BurrowFerguson, Kynleigh Artz V,  MD. Electronically Signed: Diona BrownerJennifer Gorman, Medical Scribe. 09/10/17. 12:01 PM.  I personally performed the services described in this documentation, which was SCRIBED in my presence. The recorded information has been reviewed and considered accurate. It has been edited as necessary during review. Tilda BurrowJohn V Obert Espindola, MD

## 2017-10-13 ENCOUNTER — Other Ambulatory Visit: Payer: Self-pay | Admitting: Obstetrics and Gynecology

## 2017-10-13 DIAGNOSIS — N83202 Unspecified ovarian cyst, left side: Secondary | ICD-10-CM

## 2017-10-15 ENCOUNTER — Ambulatory Visit: Payer: BLUE CROSS/BLUE SHIELD | Admitting: Obstetrics and Gynecology

## 2017-10-15 ENCOUNTER — Encounter: Payer: Self-pay | Admitting: Obstetrics and Gynecology

## 2017-10-15 ENCOUNTER — Ambulatory Visit (INDEPENDENT_AMBULATORY_CARE_PROVIDER_SITE_OTHER): Payer: BLUE CROSS/BLUE SHIELD

## 2017-10-15 VITALS — BP 140/70 | HR 126 | Ht 65.0 in | Wt 107.4 lb

## 2017-10-15 DIAGNOSIS — N6001 Solitary cyst of right breast: Secondary | ICD-10-CM

## 2017-10-15 DIAGNOSIS — N83202 Unspecified ovarian cyst, left side: Secondary | ICD-10-CM

## 2017-10-15 NOTE — Progress Notes (Signed)
PELVIC US TA/TV:homogeneous anteverted uterus,wnl,EEC 5.2 mm,normal ovaries bilat,no free fluid,ovaries appear mobile

## 2017-10-15 NOTE — Progress Notes (Signed)
[facility] Visit  10/15/2017            Patient name: Paula Riley MRN [id number]  Date of birth: 04/08/1981  CC & HPI:  Paula Bearawanda Mangano is a 37 y.o. female presenting today for f/u of U/S for a left ovarian dermoid or hemorrhagic cyst. Cyst was not causing her any pain, but did cause occasional discomfort. She had an ultrasound done on 09/03/17, and again today, which showed resolved cyst. She has also noticed a lump on her right breast that she saw about 1 week ago. Patient's last menstrual period was 10/12/2017.   She is anxious about her breast issue, stating her grandmother had hx of breast cancer.  ROS:  ROS  (+) lump on right breast (-) fever All systems are negative except as noted in the HPI and PMH.   Pertinent History Reviewed:   Reviewed: Significant for induced abortion,  Medical         Past Medical History:  Diagnosis Date  . Anxiety   . GERD (gastroesophageal reflux disease)   . Heart murmur   . History of Holter monitoring 06/2010 and 11/2011   Cardionet montior with sinus tachycardia, PAC's only, no arrhythmias either monitor  . Hx of echocardiogram 06/2010   normal  . Palpitations                               Surgical Hx:    Past Surgical History:  Procedure Laterality Date  . DENTAL SURGERY    . INDUCED ABORTION     Medications: Reviewed & Updated - see associated section                       Current Outpatient Medications:  .  acetaminophen (TYLENOL) 500 MG tablet, Take 1,000 mg by mouth every 6 (six) hours as needed for mild pain., Disp: , Rfl:  .  escitalopram (LEXAPRO) 10 MG tablet, Take 1 tablet by mouth every morning., Disp: , Rfl: 1 .  KLONOPIN 0.5 MG tablet, Take 0.5 mg by mouth 3 (three) times daily as needed for anxiety. , Disp: , Rfl:  .  metoprolol succinate (TOPROL-XL) 50 MG 24 hr tablet, Take 1 tablet (50 mg total) by mouth daily. MAY TAKE ADD1/2 TABLET DAILY-IF SX PERSIST AFTER 30MIN OK TO TAKE ANOTHER 1/2 TAB, Disp: 40 tablet,  Rfl: 11 .  Omega-3 Fatty Acids (FISH OIL PO), Take 2,000 mg by mouth daily., Disp: , Rfl:  .  Vitamin D, Ergocalciferol, (DRISDOL) 50000 units CAPS capsule, Take 1 capsule by mouth once a week., Disp: , Rfl: 0   Social History: Reviewed -  reports that  has never smoked. she has never used smokeless tobacco.  Objective Findings:  Vitals: Blood pressure 140/70, pulse (!) 126, height 5\' 5"  (1.651 m), weight 107 lb 6.4 oz (48.7 kg), last menstrual period 10/12/2017.  PHYSICAL EXAMINATION General appearance - alert, well appearing, and in no distress, oriented to person, place, and time and normal appearing weight Mental status - alert, oriented to person, place, and time, normal mood, behavior, speech, dress, motor activity, and thought processes Chest -  Heart -  Abdomen -  Breasts - 1 cm, non-tender cyst present on top of right breast, very mobile, smooth considered low risk for pathology Skin - normal coloration and turgor, no rashes, no suspicious skin lesions noted  PELVIC Not indicated  Ultrasound: [place]ELVIC [place]  TA/TV:homogeneous anteverted uterus,wnl,EEC 5.2 mm,normal ovaries bilat,no free fluid,ovaries appear mobile   Assessment & Plan:   A:  1.  U/S today, left sided adnexal cyst resolved 2. Small 1 cm cyst in right breast, 1200 3. Anxious patient will need to discuss results of mammogram face-to-face  P:  1.  F/u in 3 weeks for gyn f/u 2. Order diagnostic mammogram right breast with U/S on Tuesday the 12th    By signing my name below, I, Izna Ahmed, attest that this documentation has been prepared under the direction and in the presence of Tilda Burrow, MD. Electronically Signed: Redge Gainer, Medical Scribe. 10/15/17. 4:32 PM.  I personally performed the services described in this documentation, which was SCRIBED in my presence. The recorded information has been reviewed and considered accurate. It has been edited as necessary during review. Tilda Burrow,  MD

## 2017-10-19 ENCOUNTER — Other Ambulatory Visit: Payer: Self-pay | Admitting: Obstetrics and Gynecology

## 2017-10-19 DIAGNOSIS — IMO0002 Reserved for concepts with insufficient information to code with codable children: Secondary | ICD-10-CM

## 2017-10-19 DIAGNOSIS — R229 Localized swelling, mass and lump, unspecified: Principal | ICD-10-CM

## 2017-10-27 ENCOUNTER — Ambulatory Visit (HOSPITAL_COMMUNITY)
Admission: RE | Admit: 2017-10-27 | Discharge: 2017-10-27 | Disposition: A | Discharge status: Home / Self Care | Payer: BLUE CROSS/BLUE SHIELD | Source: Ambulatory Visit | Attending: Obstetrics and Gynecology | Admitting: Obstetrics and Gynecology

## 2017-10-27 DIAGNOSIS — R229 Localized swelling, mass and lump, unspecified: Principal | ICD-10-CM

## 2017-10-27 DIAGNOSIS — N6001 Solitary cyst of right breast: Secondary | ICD-10-CM

## 2017-10-27 DIAGNOSIS — N6310 Unspecified lump in the right breast, unspecified quadrant: Secondary | ICD-10-CM | POA: Diagnosis not present

## 2017-10-27 DIAGNOSIS — IMO0002 Reserved for concepts with insufficient information to code with codable children: Secondary | ICD-10-CM

## 2017-11-05 ENCOUNTER — Ambulatory Visit: Payer: BLUE CROSS/BLUE SHIELD | Admitting: Obstetrics and Gynecology

## 2018-04-14 ENCOUNTER — Telehealth: Payer: Self-pay | Admitting: Cardiology

## 2018-04-14 NOTE — Telephone Encounter (Signed)
Spoke with patient who reports she normally has palpitations but it has worsened over the last week or so. She notices the palpitations more while she it at work or moving around and active. Patient take metoprolol succinate 50mg  daily and can take 25mg  PRN for palpitations - she has not used PRN beta-blocker and states she has not felt her symptoms were bad enough to take medication PRN. She uses klonopin PRN - she has used once since the palpitations have become more frequent - she did not feel any relief. Denies CP, SOB. Her most recent BP was 117/81 and HR 107. She has PAOV 8/20 with Corine ShelterLuke Kilroy   Will routed to primary cardiologist for review and advice

## 2018-04-14 NOTE — Telephone Encounter (Signed)
Would continue metoprolol as needed (in addition to daily dose); fu as scheduled Olga MillersBrian Crenshaw

## 2018-04-14 NOTE — Telephone Encounter (Signed)
Returned call to patient with MD recommendations. She is concerned about the increasing frequency of her palps and is requesting sooner appt. Scheduled to see Lyman BishopLawrence, DNP 8/5 @ 11am

## 2018-04-14 NOTE — Telephone Encounter (Signed)
New Message:      Patient c/o Palpitations:  High priority if patient c/o lightheadedness, shortness of breath, or chest pain  1) How long have you had palpitations/irregular HR/ Afib? Are you having the symptoms now? palpitations  2) Are you currently experiencing lightheadedness, SOB or CP? No  3) Do you have a history of afib (atrial fibrillation) or irregular heart rhythm?irregular heart rhythm  4) Have you checked your BP or HR? (document readings if available): 117/81  5) Are you experiencing any other symptoms? No

## 2018-04-18 NOTE — Progress Notes (Signed)
Cardiology Office Note   Date:  04/19/2018   ID:  Paula Riley, DOB 11-08-1980, MRN [id number]  PCP:  Avis Epley, PA-C  Cardiologist: Dr. [name] Chief Complaint  Patient presents with  . Tachycardia     History of Present Illness: Paula Riley is a 37 y.o. female who presents for ongoing assessment and management of palpitations, PACs per prior event monitor, and GERD.  She was last seen in the office by Randall An, PA, on 08/19/2017.  It is reported that the patient had been seen in the emergency room at [facility] prior to the office visit for rapid heart rhythm, and was documented at sinus tachycardia with a heart rate of 122 bpm.    She was ruled out for anemia, hypokalemia, or PE.  She did continue to take metoprolol XL as directed.  She was advised to restart her antianxiety medication to help with feelings of rapid heart rhythm and her anxiety.  She was advised to take an extra half a tablet of metoprolol if she had persistent palpitations.  It was felt that her anxiety was main etiology of her symptoms.  The patient called our office on 04/14/2018 with recurrent palpitations.  She was advised to take an extra dose of metoprolol as previously directed.  She comes today tearful and anxious. She states she is now seeing a counselor and has been started on klonopin and Lexapro. She just started taking this medication over the last couple of days. She continues to feel her HR racing.   Past Medical History:  Diagnosis Date  . Anxiety   . GERD (gastroesophageal reflux disease)   . Heart murmur   . History of Holter monitoring 06/2010 and 11/2011   Cardionet montior with sinus tachycardia, PAC's only, no arrhythmias either monitor  . Hx of echocardiogram 06/2010   normal  . Palpitations     Past Surgical History:  Procedure Laterality Date  . DENTAL SURGERY    . INDUCED ABORTION       Current Outpatient Medications  Medication Sig Dispense Refill  .  acetaminophen (TYLENOL) 500 MG tablet Take 1,000 mg by mouth every 6 (six) hours as needed for mild pain.    Marland Kitchen escitalopram (LEXAPRO) 10 MG tablet Take 1 tablet by mouth every morning.  1  . KLONOPIN 0.5 MG tablet Take 0.5 mg by mouth 3 (three) times daily as needed for anxiety.     . metoprolol succinate (TOPROL-XL) 50 MG 24 hr tablet Take 2 tablets (100 mg total) by mouth daily. MAY TAKE ADD1/2 TABLET DAILY-IF SX PERSIST AFTER OK TO TAKE ANOTHER 1/2 TAB 45 tablet 11  . Omega-3 Fatty Acids (FISH OIL PO) Take 2,000 mg by mouth daily.    . Vitamin D, Ergocalciferol, (DRISDOL) 50000 units CAPS capsule Take 1 capsule by mouth once a week.  0   No current facility-administered medications for this visit.     Allergies:   Patient has no known allergies.    Social History:  The patient  reports that she has never smoked. She has never used smokeless tobacco. She reports that she does not drink alcohol or use drugs.   Family History:  The patient's family history includes Breast cancer in her maternal grandmother; CAD in her other; Diabetes in her maternal grandmother; Healthy in her sister, sister, son, and son; Hypertension in her other.    ROS: All other systems are reviewed and negative. Unless otherwise mentioned in H&P    PHYSICAL  EXAM: VS:  BP 130/88   Pulse (!) 117   Ht 5\' 5"  (1.651 m)   Wt 104 lb 6.4 oz (47.4 kg)   BMI 17.37 kg/m  , BMI Body mass index is 17.37 kg/m. GEN: Well nourished, well developed, in no acute distress  HEENT: normal  Neck: no JVD, carotid bruits, or masses Cardiac:RRR; no murmurs, rubs, or gallops,no edema  Respiratory:  clear to auscultation bilaterally, normal work of breathing GI: soft, nontender, nondistended, + BS MS: no deformity or atrophy  Skin: warm and dry, no rash Neuro:  Strength and sensation are intact Psych: euthymic mood, tearful crying.   EKG:  Sinus tachycardia rate of 117 bpm   Recent Labs: 08/11/2017: BUN 10; Creatinine,  Ser 0.54; Hemoglobin 12.3; Platelets 286; Potassium 3.0; Sodium 134    Lipid Panel No results found for: CHOL, TRIG, HDL, CHOLHDL, VLDL, LDLCALC, LDLDIRECT    Wt Readings from Last 3 Encounters:  04/19/18 104 lb 6.4 oz (47.4 kg)  10/15/17 107 lb 6.4 oz (48.7 kg)  09/10/17 109 lb 3.2 oz (49.5 kg)      Other studies Reviewed: Echocardiogram 06/27/2010 Left ventricle: The cavity size was normal. Riley thickness was normal. Systolic function was vigorous. The estimated ejection fraction was in the range of 65% to 70%. Riley motion was normal; there were no regional Riley motion abnormalities. Doppler parameters are consistent with abnormal left ventricular relaxation (grade 1 diastolic dysfunction).  Transthoracic echocardiography. M-mode, complete 2D, spectral Doppler, and color Doppler. Height: Height: 165.1cm. Height: 65in. Weight: Weight: 49.9kg. Weight: 109.8lb. Body mass index: BMI: 18.3kg/m^2. Body surface area: BSA: 1.4727m^2. Blood pressure: 123/84. Patient status: Outpatient. [facility]   ASSESSMENT AND PLAN:  1.  Tachycardia: She denies medical non-compliance. She is continuing to feel palpitations. I have suggested that she continue metoprolol 50 mg and taken and additional 1/2 tablet when she returns home today. She denies caffeine use or illicit drug use. TSH was checked 10/06/2016 at 1.62.   2. Anxiety: Followed by a counselor and is now on antianxiety medications and antidepressants. I have asked several tines if she is safe, and if someone was hurting or abusing her. She denies this, but remains tearful    Current medicines are reviewed at length with the patient today.    Labs/ tests ordered today include: None  Bettey MareKathryn M. Liborio NixonLawrence DNP, ANP, AACC   04/19/2018 12:32 PM    [facility] [address], [place], [place] [address] Phone: [phone]; Fax: [phone]

## 2018-04-19 ENCOUNTER — Ambulatory Visit (INDEPENDENT_AMBULATORY_CARE_PROVIDER_SITE_OTHER): Payer: Self-pay | Admitting: Adult Health

## 2018-04-19 ENCOUNTER — Encounter: Payer: Self-pay | Admitting: Adult Health

## 2018-04-19 VITALS — BP 130/88 | HR 117 | Ht 65.0 in | Wt 104.4 lb

## 2018-04-19 DIAGNOSIS — R002 Palpitations: Secondary | ICD-10-CM

## 2018-04-19 DIAGNOSIS — F418 Other specified anxiety disorders: Secondary | ICD-10-CM

## 2018-04-19 MED ORDER — METOPROLOL SUCCINATE ER 50 MG PO TB24: 75.0000 mg | ORAL_TABLET | Freq: Every day | ORAL | 11 refills | Status: DC

## 2018-04-19 NOTE — Patient Instructions (Signed)
Medication Instructions:  INCREASE METOPROLOL 75MG  DAILY-TAKE 1/2 TABLET WHEN YOU GET HOME TODAY If you need a refill on your cardiac medications before your next appointment, please call your pharmacy.  Follow-Up: Your physician wants you to follow-up in: 3 MONTHS WITH DR CRENSHAW -OR- KATHRYN LAWRENCE (NURSE PRACTIONIER), DNP,AACC IF PRIMARY CARDIOLOGIST IS UNAVAILABLE.   Thank you for choosing [facility] at [facility]!!

## 2018-04-21 ENCOUNTER — Telehealth: Payer: Self-pay | Admitting: Cardiology

## 2018-04-21 NOTE — Telephone Encounter (Signed)
lmtcb ./cy 

## 2018-04-21 NOTE — Telephone Encounter (Signed)
Follow Up:; ° ° °Returning your call. °

## 2018-04-21 NOTE — Telephone Encounter (Signed)
Continue present meds Paula Riley  

## 2018-04-21 NOTE — Telephone Encounter (Signed)
New Message        Pt c/o medication issue:  1. Name of Medication: metoprolol succinate (TOPROL-XL) 50 MG 24 hr tablet  2. How are you currently taking this medication (dosage and times per day)? Once a day  3. Are you having a reaction (difficulty breathing--STAT)? No  4. What is your medication issue? Patient states the medication is not working. She is still having palpitations.      Patient c/o Palpitations:  High priority if patient c/o lightheadedness, shortness of breath, or chest pain  1) How long have you had palpitations/irregular HR/ Afib? Are you having the symptoms now? For a while  2) Are you currently experiencing lightheadedness, SOB or CP? No   3) Do you have a history of afib (atrial fibrillation) or irregular heart rhythm? No  4) Have you checked your BP or HR? (document readings if available): No   5) Are you experiencing any other symptoms? Patient is just experiencing palpitations.

## 2018-04-21 NOTE — Telephone Encounter (Signed)
Spoke with pt and pt continues to have palpitations despite increasing Metoprolol to 75 mg on 04/19/18 per pt does not consume caffeine. Asked pt what heart rate was and pt does not know . Encouraged to monitor and log rates.Will forward message to Dr Jens Somrenshaw for review .Zack Seal/cy

## 2018-04-22 NOTE — Telephone Encounter (Signed)
Spoke with pt. Patient states that she is still currently experiencing palpitations. Patient's Metoprolol was increased to 75mg  daily at her 04/19/18 appt. Patient aware of Dr.Crenshaw's recommendation to continue that current dose. Advised patient to avoid stimulants like caffeine and continue the current medication regimen as prescribed and f/u as planned. Advises her to call the office if symptoms worsen or fail to improve in time.  Patient agreeable with plan and verbalized understanding.

## 2018-04-22 NOTE — Telephone Encounter (Signed)
Follow up  ° ° °Patient is returning call.  °

## 2018-05-03 ENCOUNTER — Telehealth: Payer: Self-pay | Admitting: Cardiology

## 2018-05-03 NOTE — Telephone Encounter (Signed)
Spoke to patient. She states she is still having palpations all day-- she states she is calling back about her symptoms. She states the metoprolol 75 mg is not working. She states she did increase as instructed from 04/22/18.  She states she has not been ingesting any caffeine or decaffeinated foods or drink.   aware will defer to Dr Jamesetta Geraldsrenhaw

## 2018-05-03 NOTE — Telephone Encounter (Signed)
New Message:   Pt says she have been having PVC's all day.

## 2018-05-03 NOTE — Telephone Encounter (Signed)
PAOV Brian Crenshaw  

## 2018-05-04 ENCOUNTER — Ambulatory Visit: Payer: BLUE CROSS/BLUE SHIELD | Admitting: Cardiology

## 2018-05-04 ENCOUNTER — Encounter (HOSPITAL_COMMUNITY): Payer: Self-pay

## 2018-05-04 ENCOUNTER — Other Ambulatory Visit: Payer: Self-pay

## 2018-05-04 ENCOUNTER — Emergency Department (HOSPITAL_COMMUNITY)
Admission: EM | Admit: 2018-05-04 | Discharge: 2018-05-04 | Disposition: A | Discharge status: Home / Self Care | Payer: Medicaid Other | Attending: Emergency Medicine | Admitting: Emergency Medicine

## 2018-05-04 ENCOUNTER — Telehealth: Payer: Self-pay | Admitting: Cardiology

## 2018-05-04 DIAGNOSIS — F419 Anxiety disorder, unspecified: Secondary | ICD-10-CM | POA: Insufficient documentation

## 2018-05-04 DIAGNOSIS — R002 Palpitations: Secondary | ICD-10-CM

## 2018-05-04 DIAGNOSIS — Z79899 Other long term (current) drug therapy: Secondary | ICD-10-CM | POA: Insufficient documentation

## 2018-05-04 LAB — URINALYSIS, ROUTINE W REFLEX MICROSCOPIC
Bilirubin Urine: NEGATIVE
Glucose, UA: NEGATIVE mg/dL
Hgb urine dipstick: NEGATIVE
Ketones, ur: NEGATIVE mg/dL
Nitrite: NEGATIVE
Protein, ur: NEGATIVE mg/dL
Specific Gravity, Urine: 1.012 (ref 1.005–1.030)
pH: 5 (ref 5.0–8.0)

## 2018-05-04 LAB — CBC WITH DIFFERENTIAL/PLATELET
Basophils Absolute: 0 10*3/uL (ref 0.0–0.1)
Basophils Relative: 0 %
Eosinophils Absolute: 0 10*3/uL (ref 0.0–0.7)
Eosinophils Relative: 0 %
HCT: 43 % (ref 36.0–46.0)
Hemoglobin: 14.7 g/dL (ref 12.0–15.0)
Lymphocytes Relative: 30 %
Lymphs Abs: 1.7 10*3/uL (ref 0.7–4.0)
MCH: 32.2 pg (ref 26.0–34.0)
MCHC: 34.2 g/dL (ref 30.0–36.0)
MCV: 94.1 fL (ref 78.0–100.0)
Monocytes Absolute: 0.4 10*3/uL (ref 0.1–1.0)
Monocytes Relative: 7 %
Neutro Abs: 3.5 10*3/uL (ref 1.7–7.7)
Neutrophils Relative %: 63 %
Platelets: 285 10*3/uL (ref 150–400)
RBC: 4.57 MIL/uL (ref 3.87–5.11)
RDW: 12.3 % (ref 11.5–15.5)
WBC: 5.6 10*3/uL (ref 4.0–10.5)

## 2018-05-04 LAB — BASIC METABOLIC PANEL
Anion gap: 8 (ref 5–15)
BUN: 11 mg/dL (ref 6–20)
CO2: 25 mmol/L (ref 22–32)
Calcium: 9.8 mg/dL (ref 8.9–10.3)
Chloride: 106 mmol/L (ref 98–111)
Creatinine, Ser: 0.62 mg/dL (ref 0.44–1.00)
GFR calc Af Amer: >60 mL/min (ref >60)
GFR calc non Af Amer: >60 mL/min (ref >60)
Glucose, Bld: 108 mg/dL — ABNORMAL HIGH (ref 70–99)
Potassium: 3.7 mmol/L (ref 3.5–5.1)
Sodium: 139 mmol/L (ref 135–145)

## 2018-05-04 LAB — PREGNANCY, URINE: Preg Test, Ur: NEGATIVE

## 2018-05-04 LAB — TROPONIN I: Troponin I: <0.03 ng/mL (ref <0.03)

## 2018-05-04 LAB — TSH: TSH: 1.274 u[IU]/mL (ref 0.350–4.500)

## 2018-05-04 LAB — MAGNESIUM: Magnesium: 1.9 mg/dL (ref 1.7–2.4)

## 2018-05-04 MED ORDER — SODIUM CHLORIDE 0.9 % IV BOLUS
1000.0000 mL | Freq: Once | INTRAVENOUS | Status: AC
  Administered 2018-05-04: 1000 mL via INTRAVENOUS

## 2018-05-04 NOTE — Telephone Encounter (Signed)
Spoke to pt and advised of Dr. Ludwig Clarksrenshaw's recommendation. Pt voiced concerns that she would like to be seen today as a Nurse visit. Informed that per Office policy we don't accept walk-in. Pt verbalized understanding. Appointment scheduled for 05/06/18 at 10am with Micah FlesherAngela Duke, PA.  Pt voiced that she may go to the ED for evaluations. Advised pt that if she feels symptoms are that bad, then would recommend to go ahead to ED.

## 2018-05-04 NOTE — Discharge Instructions (Addendum)
You are evaluated in the emergency department for worsening palpitations.  We did blood work and an EKG that did not show an obvious cause of your symptoms.  It will be important for you to keep your appointment with cardiology as scheduled.

## 2018-05-04 NOTE — Telephone Encounter (Signed)
Ov 05/06/18

## 2018-05-04 NOTE — ED Triage Notes (Signed)
Pt has a history of heart palpitations and is on Metoprolol, but is now experiencing them more often. Pt is not SOB or sweaty. NAD. Today she has been experiencing episodes off and on all day

## 2018-05-04 NOTE — Progress Notes (Signed)
Cardiology Office Note:    Date:  05/06/2018   ID:  Paula Riley, DOB 11/12/1980, MRN [id number]  PCP:  Avis EpleyJackson, [name] J, PA-C  Cardiologist:  Olga MillersBrian Crenshaw, MD   Referring MD: Avis EpleyJackson, [name] J, [place]*   Chief Complaint  Patient presents with  . Hospitalization Follow-up    palpitations    History of Present Illness:     Paula Riley is a 37 y.o. female with a hx of GERD, palpitations.  She was last seen by Joni ReiningKathryn Lawrence in clinic on 04/19/2018 for ongoing assessment and management of palpitations, PACs per prior event monitor.  He had a prior ER visit on 08/19/2017 with documented sinus tachycardia with a heart rate of 122 bpm.  She is maintained on Toprol and instructed to restart her antianxiety medications. She has seen a counselor and started on klonopin and lexapro. She continued to complained of ongoing palpitations and was instructed to continue her 50 mg of metoprolol and to take an additional 25 mg for palpitations as needed.  TSH was within normal limits.  She was seen in the ER on 05/12/2018 for palpitations.  She denied syncope, chest pain, and shortness of breath.  She denied recent illness.  EKG with sinus tachycardia with heart rate 114 bpm.  Magnesium, TSH, CBC, BMP within normal limits.  She was discharged from the ER with no change in her toprol dose.   She presents today for ED visit follow-up. EKG x 2 in 04/2018 with sinus tachycardia. She has been taking 75 mg toprol. She states her palpitations have been daily approximately 10 times per day.  She is asymptomatic with these palpitations, she denies dizziness and presyncope.  She has not had a loss of consciousness.  She states that the palpitations are worse when she is at work at [employer].  She is generally on her feet all day.  She has been working with a Veterinary surgeoncounselor about her anxiety but has not yet started her Lexapro.  She generally does not want to take medications.  EKG today with sinus tachycardia with HR to 107  bpm.    Past Medical History:  Diagnosis Date  . Anxiety   . GERD (gastroesophageal reflux disease)   . Heart murmur   . History of Holter monitoring 06/2010 and 11/2011   Cardionet montior with sinus tachycardia, PAC's only, no arrhythmias either monitor  . Hx of echocardiogram 06/2010   normal  . Palpitations     Past Surgical History:  Procedure Laterality Date  . DENTAL SURGERY    . INDUCED ABORTION      Current Medications: Current Meds  Medication Sig  . acetaminophen (TYLENOL) 500 MG tablet Take 1,000 mg by mouth every 6 (six) hours as needed for mild pain.  Marland Kitchen. KLONOPIN 0.5 MG tablet Take 0.5 mg by mouth 3 (three) times daily as needed for anxiety.   . metoprolol succinate (TOPROL-XL) 50 MG 24 hr tablet Take 2 tablets (100 mg total) by mouth daily.  . Omega-3 Fatty Acids (FISH OIL PO) Take 2,000 mg by mouth daily.  . [DISCONTINUED] metoprolol succinate (TOPROL-XL) 50 MG 24 hr tablet Take 2 tablets (100 mg total) by mouth daily. MAY TAKE ADD1/2 TABLET DAILY-IF SX PERSIST AFTER 30MIN OK TO TAKE ANOTHER 1/2 TAB     Allergies:   Patient has no known allergies.   Social History   Socioeconomic History  . Marital status: Single    Spouse name: Not on file  . Number of children: Not  on file  . Years of education: Not on file  . Highest education level: Not on file  Occupational History  . Occupation: [employer]  . Financial resource strain: Not on file  . Food insecurity:    Worry: Not on file    Inability: Not on file  . Transportation needs:    Medical: Not on file    Non-medical: Not on file  Tobacco Use  . Smoking status: Never Smoker  . Smokeless tobacco: Never Used  Substance and Sexual Activity  . Alcohol use: No    Alcohol/week: 0.0 standard drinks  . Drug use: No  . Sexual activity: Not Currently    Birth control/protection: Condom  Lifestyle  . Physical activity:    Days per week: Not on file    Minutes per session: Not on file  .  Stress: Not on file  Relationships  . Social connections:    Talks on phone: Not on file    Gets together: Not on file    Attends religious service: Not on file    Active member of club or organization: Not on file    Attends meetings of clubs or organizations: Not on file    Relationship status: Not on file  Other Topics Concern  . Not on file  Social History Narrative   Lives with 2 children in an apartment on the second floor.     Works at [employer].  Education: high school.     Family History: The patient's family history includes Breast cancer in her maternal grandmother; CAD in her other; Diabetes in her maternal grandmother; Healthy in her sister, sister, son, and son; Hypertension in her other.  ROS:   Please see the history of present illness.    All other systems reviewed and are negative.  EKGs/Labs/Other Studies Reviewed:    The following studies were reviewed today:  none  EKG:  EKG is ordered today.  The ekg ordered today  demonstrates sinus tachycardia with HR 107 bpm.  Recent Labs: 05/04/2018: BUN 11; Creatinine, Ser 0.62; Hemoglobin 14.7; Magnesium 1.9; Platelets 285; Potassium 3.7; Sodium 139; TSH 1.274  Recent Lipid Panel No results found for: CHOL, TRIG, HDL, CHOLHDL, VLDL, LDLCALC, LDLDIRECT  Physical Exam:    VS:  BP 127/80   Pulse (!) 107   Ht 5\' 5"  (1.651 m)   Wt 105 lb (47.6 kg)   LMP 04/11/2018   BMI 17.47 kg/m     Wt Readings from Last 3 Encounters:  05/06/18 105 lb (47.6 kg)  05/04/18 105 lb (47.6 kg)  04/19/18 104 lb 6.4 oz (47.4 kg)     GEN: Well nourished, well developed in no acute distress but appears very anxious HEENT: Normal NECK: No JVD; No carotid bruits CARDIAC: regular rhythm, tachycardic rate, no murmurs, rubs, gallops RESPIRATORY:  Clear to auscultation without rales, wheezing or rhonchi  ABDOMEN: Soft, non-tender, non-distended MUSCULOSKELETAL:  No edema; No deformity  SKIN: Warm and dry NEUROLOGIC:  Alert and oriented  x 3 PSYCHIATRIC:  anxious  ASSESSMENT:    1. Palpitations   2. Sinus tachycardia   3. Anxiety    PLAN:    In order of problems listed above:  Palpitations Sinus tachycardia Anxiety She appears very anxious in the office today.  She is anxious at baseline and is currently working with a Veterinary surgeon.  She has not started the Lexapro that was prescribed.  We discussed PACs and PVCs.  I do not see results of  the recent monitor.  We will repeat a 48-hour Holter to evaluate her baseline heart rate and frequency of PACs and PVCs.  We will also rule out possible SVT.  I spoke with her about increasing her Toprol to 100 mg, she is only been taking 75 mg she has not been taking an extra half a tablet as needed as instructed.  He generally does not want to take medications.  We also discussed the possibility of over-the-counter magnesium supplements.  We will arrange a 48-hour Holter monitor and follow-up with Dr. Jens Somrenshaw.   Medication Adjustments/Labs and Tests Ordered: Current medicines are reviewed at length with the patient today.  Concerns regarding medicines are outlined above.  Orders Placed This Encounter  Procedures  . Holter monitor - 48 hour  . EKG 12-Lead   Meds ordered this encounter  Medications  . metoprolol succinate (TOPROL-XL) 50 MG 24 hr tablet    Sig: Take 2 tablets (100 mg total) by mouth daily.    Dispense:  60 tablet    Refill:  2    Signed, Marcelino Dusterngela Nicole Duke, [place]  05/06/2018 11:09 AM    [facility]

## 2018-05-04 NOTE — Telephone Encounter (Signed)
New Message ° ° ° ° ° ° ° ° ° °Patient returned your call °

## 2018-05-04 NOTE — ED Provider Notes (Signed)
[address] [facility]GENCY DEPARTMENT Provider Note   CSN: [id number] Arrival date & time: 05/04/18  1202     History   Chief Complaint Chief Complaint  Patient presents with  . Palpitations    HPI Paula Riley is a 37 y.o. female.  She presents with complaint of increased palpitations over the last 3 weeks.  She says she feels it being a regular and pounding harder in her chest.  It is associated with feeling hot and cold.  No syncope no shortness of breath.  She denies any illness symptoms.  Last menstrual period the 20th of last month.  She has had more long-standing palpitations and follows with Dr. Silvestre Momentrenshaw [place] [facility].  She is on metoprolol 100 mg daily and she was instructed to take an extra half tablet.  The history is provided by the patient.  Palpitations   This is a recurrent problem. The current episode started more than 1 week ago. The problem has not changed since onset.The problem is associated with an unknown factor. Associated symptoms include irregular heartbeat. Pertinent negatives include no fever, no chest pain, no near-syncope, no syncope, no abdominal pain, no nausea, no vomiting, no headaches, no back pain, no cough and no shortness of breath. The treatment provided no relief.    Past Medical History:  Diagnosis Date  . Anxiety   . GERD (gastroesophageal reflux disease)   . Heart murmur   . History of Holter monitoring 06/2010 and 11/2011   Cardionet montior with sinus tachycardia, PAC's only, no arrhythmias either monitor  . Hx of echocardiogram 06/2010   normal  . Palpitations     Patient Active Problem List   Diagnosis Date Noted  . Breast cyst, right 10/15/2017  . Anxiety 05/03/2015  . Sinus tachycardia 08/28/2014  . Cervical high risk HPV (human papillomavirus) test positive 05/30/2014  . Palpitations 12/02/2011  . GERD (gastroesophageal reflux disease) 11/13/2011  . Abdominal pain, other specified site 11/13/2011    Past Surgical  History:  Procedure Laterality Date  . DENTAL SURGERY    . INDUCED ABORTION       OB History    Gravida  3   Para  2   Term  2   Preterm      AB  1   Living  2     SAB      TAB  1   Ectopic      Multiple      Live Births  2            Home Medications    Prior to Admission medications   Medication Sig Start Date End Date Taking? Authorizing Provider  acetaminophen (TYLENOL) 500 MG tablet Take 1,000 mg by mouth every 6 (six) hours as needed for mild pain.    [provider]  escitalopram (LEXAPRO) 10 MG tablet Take 1 tablet by mouth every morning. 08/14/17   [provider]  KLONOPIN 0.5 MG tablet Take 0.5 mg by mouth 3 (three) times daily as needed for anxiety.  09/24/14   [provider]  metoprolol succinate (TOPROL-XL) 50 MG 24 hr tablet Take 2 tablets (100 mg total) by mouth daily. MAY TAKE ADD1/2 TABLET DAILY-IF SX PERSIST AFTER 30MIN OK TO TAKE ANOTHER 1/2 TAB 04/19/18   Jodelle GrossLawrence, Kathryn M, NP  Omega-3 Fatty Acids (FISH OIL PO) Take 2,000 mg by mouth daily.    [provider]  Vitamin D, Ergocalciferol, (DRISDOL) 50000 units CAPS capsule Take 1 capsule by  mouth once a week. 08/17/17   [provider]  famotidine (PEPCID) 20 MG tablet Take 1 tablet (20 mg total) by mouth 2 (two) times daily. 11/01/11 11/22/11  Ivery QualeBryant, [place], PA-C    Family History Family History  Problem Relation Age of Onset  . Hypertension Other   . CAD Other   . Diabetes Maternal Grandmother   . Breast cancer Maternal Grandmother   . Healthy Son   . Healthy Sister        x 6  . Healthy Sister        x 2  . Healthy Son     Social History Social History   Tobacco Use  . Smoking status: Never Smoker  . Smokeless tobacco: Never Used  Substance Use Topics  . Alcohol use: No    Alcohol/week: 0.0 standard drinks  . Drug use: No     Allergies   Patient has no known allergies.   Review of Systems Review of Systems    Constitutional: Negative for fever.  HENT: Negative for sore throat.   Eyes: Negative for visual disturbance.  Respiratory: Negative for cough and shortness of breath.   Cardiovascular: Positive for palpitations. Negative for chest pain, syncope and near-syncope.  Gastrointestinal: Negative for abdominal pain, nausea and vomiting.  Genitourinary: Negative for dysuria.  Musculoskeletal: Negative for back pain.  Skin: Negative for rash.  Neurological: Negative for headaches.     Physical Exam Updated Vital Signs BP (!) 145/87 (BP Location: Right Arm)   Pulse (!) 102   Temp 98.2 F (36.8 C) (Oral)   Resp 16   Ht 5\' 5"  (1.651 m)   Wt 47.6 kg   LMP 04/11/2018   SpO2 100%   BMI 17.47 kg/m   Physical Exam  Constitutional: She appears well-developed and well-nourished. No distress.  HENT:  Head: Normocephalic and atraumatic.  Eyes: Conjunctivae are normal.  Neck: Neck supple.  Cardiovascular: Regular rhythm and normal pulses.  No extrasystoles are present. Tachycardia present.  No murmur heard. Pulmonary/Chest: Effort normal and breath sounds normal. No respiratory distress.  Abdominal: Soft. There is no tenderness.  Musculoskeletal: She exhibits no edema or tenderness.  Neurological: She is alert.  Skin: Skin is warm and dry.  Psychiatric: She has a normal mood and affect.  Nursing note and vitals reviewed.    ED Treatments / Results  Labs (all labs ordered are listed, but only abnormal results are displayed) Labs Reviewed  BASIC METABOLIC PANEL - Abnormal; Notable for the following components:      Result Value   Glucose, Bld 108 (*)    All other components within normal limits  URINALYSIS, ROUTINE W REFLEX MICROSCOPIC - Abnormal; Notable for the following components:   Leukocytes, UA SMALL (*)    Bacteria, UA RARE (*)    All other components within normal limits  CBC WITH DIFFERENTIAL/PLATELET  TROPONIN I  TSH  MAGNESIUM  PREGNANCY, URINE    EKG EKG  Interpretation  Date/Time:  Tuesday May 04 2018 12:09:12 EDT Ventricular Rate:  114 PR Interval:  148 QRS Duration: 66 QT Interval:  304 QTC Calculation: 419 R Axis:   75 Text Interpretation:  Sinus tachycardia Right atrial enlargement Nonspecific ST abnormality Abnormal ECG similar to prior 11/18 Confirmed by Meridee ScoreButler, Yaminah Clayborn [id number]) on 05/04/2018 12:15:25 PM   Radiology No results found.  Procedures Procedures (including critical care time)  Medications Ordered in ED Medications  sodium chloride 0.9 % bolus 1,000 mL (0 mLs Intravenous Stopped 05/04/18  1352)     Initial Impression / Assessment and Plan / ED Course  I have reviewed the triage vital signs and the nursing notes.  Pertinent labs & imaging results that were available during my care of the patient were reviewed by me and considered in my medical decision making (see chart for details).  Clinical Course as of May 05 1226  Tue May 04, 2018  2540 37 year old female here with increasing her palpitations over the past 3 weeks.  By instruction of her cardiologist she is increased her beta-blocker without any improvement.  Review of the last notes of Dr. [name] last month and he does comment upon normal echo.  There was some thought that this may be anxiety related and she is seeing a therapist and starting on some anxiety medications.   [MB]  1434 Patients lab work here has been unremarkable.  Heart rates come down to between 95 100.  Reviewed her labs with her and she has an appointment on Thursday with cardiology.   [MB]    Clinical Course User Index [MB] Terrilee Files, MD      Final Clinical Impressions(s) / ED Diagnoses   Final diagnoses:  Palpitations    ED Discharge Orders    None       Terrilee Files, MD 05/05/18 1228

## 2018-05-06 ENCOUNTER — Ambulatory Visit (INDEPENDENT_AMBULATORY_CARE_PROVIDER_SITE_OTHER): Payer: Self-pay | Admitting: Physician Assistant

## 2018-05-06 ENCOUNTER — Encounter: Payer: Self-pay | Admitting: Physician Assistant

## 2018-05-06 VITALS — BP 127/80 | HR 107 | Ht 65.0 in | Wt 105.0 lb

## 2018-05-06 DIAGNOSIS — R002 Palpitations: Secondary | ICD-10-CM

## 2018-05-06 DIAGNOSIS — R000 Tachycardia, unspecified: Secondary | ICD-10-CM

## 2018-05-06 DIAGNOSIS — F419 Anxiety disorder, unspecified: Secondary | ICD-10-CM

## 2018-05-06 MED ORDER — METOPROLOL SUCCINATE ER 50 MG PO TB24: 100.0000 mg | ORAL_TABLET | Freq: Every day | ORAL | 2 refills | Status: DC

## 2018-05-06 NOTE — Patient Instructions (Addendum)
Medication Instructions:  INCREASE Toprol to 100mg  (2 tablets) take every morning  TRY Taking Magnesium 400mg  at bedtime to see if this helps with palpitations; this can be bought over the counter  If you need a refill on your cardiac medications before your next appointment, please call your pharmacy.  Labwork: None   Testing/Procedures: Your physician has recommended that you wear a 48 Hour holter monitor. Holter monitors are medical devices that record the heart's electrical activity. Doctors most often use these monitors to diagnose arrhythmias. Arrhythmias are problems with the speed or rhythm of the heartbeat. The monitor is a small, portable device. You can wear one while you do your normal daily activities. This is usually used to diagnose what is causing palpitations/syncope (passing out). [address]  Follow-Up: Your physician recommends that you schedule a follow-up appointment in: FIRST AVAILABLE WITH DR CRENSHAW.  Any Other Special Instructions Will Be Listed Below (If Applicable).

## 2018-05-10 ENCOUNTER — Ambulatory Visit (INDEPENDENT_AMBULATORY_CARE_PROVIDER_SITE_OTHER): Payer: Self-pay

## 2018-05-10 DIAGNOSIS — R002 Palpitations: Secondary | ICD-10-CM

## 2018-05-10 DIAGNOSIS — R000 Tachycardia, unspecified: Secondary | ICD-10-CM

## 2018-05-10 DIAGNOSIS — F419 Anxiety disorder, unspecified: Secondary | ICD-10-CM

## 2018-06-09 ENCOUNTER — Telehealth: Payer: Self-pay | Admitting: Cardiology

## 2018-06-09 NOTE — Telephone Encounter (Signed)
Pt aware of holter monitor results with verbal understanding.  Notes recorded by Lewayne Bunting, MD on 05/19/2018 at 8:37 AM EDT No significant arrhythmia. No change in medications. Olga Millers, MD

## 2018-06-09 NOTE — Telephone Encounter (Signed)
Would like the results from monitor

## 2018-06-10 ENCOUNTER — Telehealth: Payer: Self-pay | Admitting: Cardiology

## 2018-06-10 NOTE — Telephone Encounter (Signed)
New Message:   Patient calling she states she need to ask a few  questions Patient refuses to tell me what she would like to ask. Please call patient.

## 2018-06-10 NOTE — Telephone Encounter (Signed)
Spoke with pt, Aware of dr crenshaw's recommendations.  °

## 2018-06-10 NOTE — Telephone Encounter (Signed)
Returned call to patient, she states she has some questions for Dr. [name] in regards to her monitor.  She states her monitor showed some low HR and she is concerned about taking the metoprolol.  She is not sure if she should be taking as much as she is. (advised she is on this due to her fast HR, and without this, HR/palpitations would be higher).   She is also concerned because she does not think the metoprolol is working.  She continues to have palpitations daily and states her HR was high in the office (HR 107).   She is wondering if there is any alternative or something that can be done differently in order to control HR better.  Routed to MD to review.

## 2018-06-10 NOTE — Telephone Encounter (Signed)
Heart rate is not too slow.  Beta-blockade is the best therapy for palpitations and tachycardia. Olga Millers, MD

## 2018-07-07 NOTE — Progress Notes (Signed)
HPI: FU palpitations. Patient had an echocardiogram in October of 2011. Her ejection fraction was 65-70% and there was grade 1 diastolic dysfunction. There was no significant Doppler abnormality. Previous monitor in October of 2011 showed no significant arrhythmias. Cardionet 3/13 revealed sinus to sinus tachycardia with PACs. Repeat 3/16 showed sinus with pacs.  Holter monitor repeated August 2019.  There was sinus bradycardia, normal sinus rhythm, sinus tachycardia, occasional PAC and rare PVC.  Laboratories August 2019 showed normal TSH, negative troponin and normal hemoglobin.  Since last seen,  there is no significant dyspnea on exertion, orthopnea, PND, pedal edema, exertional chest pain or syncope.  Occasional palpitations.  Current Outpatient Medications  Medication Sig Dispense Refill  . acetaminophen (TYLENOL) 500 MG tablet Take 1,000 mg by mouth every 6 (six) hours as needed for mild pain.    Marland Kitchen KLONOPIN 0.5 MG tablet Take 0.5 mg by mouth 3 (three) times daily as needed for anxiety.     . metoprolol succinate (TOPROL-XL) 50 MG 24 hr tablet Take 2 tablets (100 mg total) by mouth daily. 60 tablet 2  . Omega-3 Fatty Acids (FISH OIL PO) Take 2,000 mg by mouth daily.     No current facility-administered medications for this visit.      Past Medical History:  Diagnosis Date  . Anxiety   . GERD (gastroesophageal reflux disease)   . Heart murmur   . History of Holter monitoring 06/2010 and 11/2011   Cardionet montior with sinus tachycardia, PAC's only, no arrhythmias either monitor  . Hx of echocardiogram 06/2010   normal  . Palpitations     Past Surgical History:  Procedure Laterality Date  . DENTAL SURGERY    . INDUCED ABORTION      Social History   Socioeconomic History  . Marital status: Single    Spouse name: Not on file  . Number of children: Not on file  . Years of education: Not on file  . Highest education level: Not on file  Occupational History  .  Occupation: [employer]  . Financial resource strain: Not on file  . Food insecurity:    Worry: Not on file    Inability: Not on file  . Transportation needs:    Medical: Not on file    Non-medical: Not on file  Tobacco Use  . Smoking status: Never Smoker  . Smokeless tobacco: Never Used  Substance and Sexual Activity  . Alcohol use: No    Alcohol/week: 0.0 standard drinks  . Drug use: No  . Sexual activity: Not Currently    Birth control/protection: Condom  Lifestyle  . Physical activity:    Days per week: Not on file    Minutes per session: Not on file  . Stress: Not on file  Relationships  . Social connections:    Talks on phone: Not on file    Gets together: Not on file    Attends religious service: Not on file    Active member of club or organization: Not on file    Attends meetings of clubs or organizations: Not on file    Relationship status: Not on file  . Intimate partner violence:    Fear of current or ex partner: Not on file    Emotionally abused: Not on file    Physically abused: Not on file    Forced sexual activity: Not on file  Other Topics Concern  . Not on file  Social History Narrative  Lives with 2 children in an apartment on the second floor.     Works at [employer].  Education: high school.    Family History  Problem Relation Age of Onset  . Hypertension Other   . CAD Other   . Diabetes Maternal Grandmother   . Breast cancer Maternal Grandmother   . Healthy Son   . Healthy Sister        x 6  . Healthy Sister        x 2  . Healthy Son     ROS: no fevers or chills, productive cough, hemoptysis, dysphasia, odynophagia, melena, hematochezia, dysuria, hematuria, rash, seizure activity, orthopnea, PND, pedal edema, claudication. Remaining systems are negative.  Physical Exam: Well-developed well-nourished in no acute distress; mildly anxious Skin is warm and dry.  HEENT is normal.  Neck is supple.  Chest is clear to auscultation  with normal expansion.  Cardiovascular exam is regular rate and rhythm.  Abdominal exam nontender or distended. No masses palpated. Extremities show no edema. neuro grossly intact  ECG-May 06, 2018-sinus tachycardia with no ST changes.  Personally reviewed  A/P  1 palpitations-patient continues to have palpitations.  She has had monitors previously that showed sinus to sinus tachycardia with PACs and PVCs.  We will continue with Toprol at present dose.  Anxiety is significantly contributing to her tachycardia.  2 anxiety-significant problem.  She will need follow-up with her primary care.  Olga Millers, MD

## 2018-07-11 ENCOUNTER — Telehealth: Payer: Self-pay | Admitting: Cardiology

## 2018-07-11 NOTE — Telephone Encounter (Signed)
Received outpatient call from patient regarding medication discrepancy. She is unsure if she took her Toprol-XL this morning or not according to the OP message. Called patient back for advise with no answer. Call went straight to voice message and unable to leave message.   Georgie Chard NP-C [facility] Pager: [phone]

## 2018-07-11 NOTE — Telephone Encounter (Signed)
Received outpatient call from patient once again stating she is unsure if she took her Toprol XL 100 mg earlier today.  Given the significant side effects if she were to take 200 mg today I have instructed her to hold off today and resume her normal dose tomorrow morning as long as she remains asymptomatic.  She is stable.  She agrees with the plan.  ED precautions reviewed.  Georgie Chard NP-C [facility] Pager: [phone]

## 2018-07-13 ENCOUNTER — Ambulatory Visit (INDEPENDENT_AMBULATORY_CARE_PROVIDER_SITE_OTHER): Payer: Self-pay | Admitting: Cardiology

## 2018-07-13 ENCOUNTER — Encounter: Payer: Self-pay | Admitting: Cardiology

## 2018-07-13 VITALS — BP 128/64 | HR 114 | Resp 16 | Ht 65.0 in | Wt 107.4 lb

## 2018-07-13 DIAGNOSIS — R000 Tachycardia, unspecified: Secondary | ICD-10-CM

## 2018-07-13 DIAGNOSIS — F419 Anxiety disorder, unspecified: Secondary | ICD-10-CM

## 2018-07-13 DIAGNOSIS — R002 Palpitations: Secondary | ICD-10-CM

## 2018-07-13 NOTE — Patient Instructions (Signed)
Medication Instructions:  NO CHANGE If you need a refill on your cardiac medications before your next appointment, please call your pharmacy.   Lab work: If you have labs (blood work) drawn today and your tests are completely normal, you will receive your results only by: . MyChart Message (if you have MyChart) OR . A paper copy in the mail If you have any lab test that is abnormal or we need to change your treatment, we will call you to review the results.  Follow-Up: At [facility], you and your health needs are our priority.  As part of our continuing mission to provide you with exceptional heart care, we have created designated Provider Care Teams.  These Care Teams include your primary Cardiologist (physician) and Advanced Practice Providers (APPs -  Physician Assistants and Nurse Practitioners) who all work together to provide you with the care you need, when you need it. You will need a follow up appointment in 12 months.  Please call our office 2 months in advance to schedule this appointment.  You may see Brian Crenshaw, MD or one of the following Advanced Practice Providers on your designated Care Team:   Luke Kilroy, PA-C Krista Kroeger, PA-C . Callie Goodrich, PA-C     

## 2018-08-11 ENCOUNTER — Other Ambulatory Visit: Payer: Self-pay

## 2018-08-11 ENCOUNTER — Telehealth: Payer: Self-pay | Admitting: Cardiology

## 2018-08-11 MED ORDER — METOPROLOL SUCCINATE ER 50 MG PO TB24: 100.0000 mg | ORAL_TABLET | Freq: Every day | ORAL | 6 refills | Status: DC

## 2018-08-11 NOTE — Telephone Encounter (Signed)
Script sent at pt's request ./cy 

## 2018-08-11 NOTE — Telephone Encounter (Signed)
°*  STAT* If patient is at the pharmacy, call can be transferred to refill team.   1. Which medications need to be refilled? (please list name of each medication and dose if known)   metoprolol succinate (TOPROL-XL) 50 MG 24 hr tablet  2. Which pharmacy/location (including street and city if local pharmacy) is medication to be sent to?  [id number] - [place], [place] - [address]  3. Do they need a 30 day or 90 day supply?   30 days   Patient has run out of medication. Took last dose today.

## 2019-03-07 ENCOUNTER — Other Ambulatory Visit: Payer: Self-pay | Admitting: Cardiology

## 2019-03-11 ENCOUNTER — Emergency Department (HOSPITAL_COMMUNITY)
Admission: EM | Admit: 2019-03-11 | Discharge: 2019-03-11 | Disposition: A | Discharge status: Home / Self Care | Payer: BC Managed Care – PPO | Attending: Emergency Medicine | Admitting: Emergency Medicine

## 2019-03-11 ENCOUNTER — Other Ambulatory Visit: Payer: Self-pay

## 2019-03-11 ENCOUNTER — Emergency Department (HOSPITAL_COMMUNITY): Payer: BC Managed Care – PPO

## 2019-03-11 DIAGNOSIS — R002 Palpitations: Secondary | ICD-10-CM | POA: Insufficient documentation

## 2019-03-11 DIAGNOSIS — F419 Anxiety disorder, unspecified: Secondary | ICD-10-CM | POA: Diagnosis not present

## 2019-03-11 LAB — TROPONIN I (HIGH SENSITIVITY): Troponin I (High Sensitivity): 3 ng/L (ref <18)

## 2019-03-11 LAB — BASIC METABOLIC PANEL
Anion gap: 9 (ref 5–15)
BUN: 6 mg/dL (ref 6–20)
CO2: 22 mmol/L (ref 22–32)
Calcium: 9.2 mg/dL (ref 8.9–10.3)
Chloride: 109 mmol/L (ref 98–111)
Creatinine, Ser: 0.7 mg/dL (ref 0.44–1.00)
GFR calc Af Amer: >60 mL/min (ref >60)
GFR calc non Af Amer: >60 mL/min (ref >60)
Glucose, Bld: 85 mg/dL (ref 70–99)
Potassium: 3.5 mmol/L (ref 3.5–5.1)
Sodium: 140 mmol/L (ref 135–145)

## 2019-03-11 LAB — CBC
HCT: 39.2 % (ref 36.0–46.0)
Hemoglobin: 13.3 g/dL (ref 12.0–15.0)
MCH: 31.3 pg (ref 26.0–34.0)
MCHC: 33.9 g/dL (ref 30.0–36.0)
MCV: 92.2 fL (ref 80.0–100.0)
Platelets: 202 10*3/uL (ref 150–400)
RBC: 4.25 MIL/uL (ref 3.87–5.11)
RDW: 11.9 % (ref 11.5–15.5)
WBC: 4.1 10*3/uL (ref 4.0–10.5)
nRBC: 0 % (ref 0.0–0.2)

## 2019-03-11 LAB — I-STAT BETA HCG BLOOD, ED (MC, WL, AP ONLY): I-stat hCG, quantitative: <5 m[IU]/mL (ref <5)

## 2019-03-11 MED ORDER — SODIUM CHLORIDE 0.9% FLUSH: 3.0000 mL | Freq: Once | INTRAVENOUS | Status: DC

## 2019-03-11 MED ORDER — LORAZEPAM 1 MG PO TABS
1.0000 mg | ORAL_TABLET | Freq: Once | ORAL | Status: AC
  Administered 2019-03-11: 1 mg via ORAL
  Filled 2019-03-11: qty 1

## 2019-03-11 MED ORDER — LORAZEPAM 1 MG PO TABS: 1.0000 mg | ORAL_TABLET | Freq: Two times a day (BID) | ORAL | 0 refills | Status: DC | PRN

## 2019-03-11 NOTE — ED Notes (Signed)
Patient verbalizes understanding of discharge instructions. Opportunity for questioning and answers were provided. Armband removed by staff, pt discharged from ED.  

## 2019-03-11 NOTE — ED Triage Notes (Signed)
Patient sent over by her doctor for fast heart rate - states she went to see him for palpitations x 2 days and abdominal upset. Denies fevers/chills, cough, shortness of breath, chest pain, N/V/D. Endorses she's not been eating/drinking well lately. LMP 6/3.

## 2019-03-11 NOTE — Discharge Instructions (Signed)
Please see your primary care doctor in 1 week.  We have prescribed you with medications for anxiety that might help.  Take it only when needed.

## 2019-03-11 NOTE — ED Provider Notes (Signed)
[facility] Provider Note   CSN: [id number] Arrival date & time: 03/11/19  1413     History   Chief Complaint Chief Complaint  Patient presents with  . Palpitations    HPI Paula Riley is a 38 y.o. female.     HPI  38 year old female comes in with chief complaint of palpitations.  She has history of palpitations, anxiety disorder.  She has had multiple Holter monitoring and is seeing cardiologist right now, and placed on metoprolol.  Patient reports that over the past 4 days she has had episodes of palpitations.  Associated with the palpitations she has some dizziness but she denies any chest pain, shortness of breath.  She mostly notices that her heart rate becomes when she is exerting herself and she is appreciating palpitations.  She saw her PCP who advised her to come to the ER.  Patient has been taking her metoprolol as prescribed.  She denies any substance abuse.  She is unsure if anxiety is playing a role, but does indicate that she is out of her Klonopin and that in general she rarely uses it.  Pt has no hx of PE, DVT and denies any exogenous hormone (testosterone / estrogen) use, long distance travels or surgery in the past 6 weeks, active cancer, recent immobilization.  Patient also denies any family history of sudden cardiac deaths.  Past Medical History:  Diagnosis Date  . Anxiety   . GERD (gastroesophageal reflux disease)   . Heart murmur   . History of Holter monitoring 06/2010 and 11/2011   Cardionet montior with sinus tachycardia, PAC's only, no arrhythmias either monitor  . Hx of echocardiogram 06/2010   normal  . Palpitations     Patient Active Problem List   Diagnosis Date Noted  . Breast cyst, right 10/15/2017  . Anxiety 05/03/2015  . Sinus tachycardia 08/28/2014  . Cervical high risk HPV (human papillomavirus) test positive 05/30/2014  . Palpitations 12/02/2011  . GERD (gastroesophageal reflux disease)  11/13/2011  . Abdominal pain, other specified site 11/13/2011    Past Surgical History:  Procedure Laterality Date  . DENTAL SURGERY    . INDUCED ABORTION       OB History    Gravida  3   Para  2   Term  2   Preterm      AB  1   Living  2     SAB      TAB  1   Ectopic      Multiple      Live Births  2            Home Medications    Prior to Admission medications   Medication Sig Start Date End Date Taking? Authorizing Provider  acetaminophen (TYLENOL) 500 MG tablet Take 1,000 mg by mouth every 6 (six) hours as needed for mild pain.    [provider]  KLONOPIN 0.5 MG tablet Take 0.5 mg by mouth 3 (three) times daily as needed for anxiety.  09/24/14   [provider]  LORazepam (ATIVAN) 1 MG tablet Take 1 tablet (1 mg total) by mouth 2 (two) times daily as needed for anxiety. 03/11/19   Derwood KaplanNanavati, Migel Hannis, MD  metoprolol succinate (TOPROL-XL) 50 MG 24 hr tablet TAKE 2 TABLETS(100 MG) BY MOUTH DAILY 03/08/19   Lewayne Buntingrenshaw, Brian S, MD  Omega-3 Fatty Acids (FISH OIL PO) Take 2,000 mg by mouth daily.    [provider]  famotidine (PEPCID)  20 MG tablet Take 1 tablet (20 mg total) by mouth 2 (two) times daily. 11/01/11 11/22/11  Ivery QualeBryant, [place], PA-C    Family History Family History  Problem Relation Age of Onset  . Hypertension Other   . CAD Other   . Diabetes Maternal Grandmother   . Breast cancer Maternal Grandmother   . Healthy Son   . Healthy Sister        x 6  . Healthy Sister        x 2  . Healthy Son     Social History Social History   Tobacco Use  . Smoking status: Never Smoker  . Smokeless tobacco: Never Used  Substance Use Topics  . Alcohol use: No    Alcohol/week: 0.0 standard drinks  . Drug use: No     Allergies   Patient has no known allergies.   Review of Systems Review of Systems  Constitutional: Positive for activity change.  Respiratory: Negative for shortness of breath.   Cardiovascular: Positive for  palpitations. Negative for chest pain.  Gastrointestinal: Negative for nausea and vomiting.  Neurological: Negative for syncope.  All other systems reviewed and are negative.    Physical Exam Updated Vital Signs BP 101/73   Pulse 80   Temp 98.9 F (37.2 C) (Oral)   Resp 17   LMP 02/16/2019 (Exact Date)   SpO2 100%   Physical Exam Vitals signs and nursing note reviewed.  Constitutional:      Appearance: She is well-developed.  HENT:     Head: Normocephalic and atraumatic.  Neck:     Musculoskeletal: Normal range of motion and neck supple.  Cardiovascular:     Rate and Rhythm: Regular rhythm. Tachycardia present.     Heart sounds: Murmur present.  Pulmonary:     Effort: Pulmonary effort is normal.  Abdominal:     General: Bowel sounds are normal.  Skin:    General: Skin is warm and dry.  Neurological:     Mental Status: She is alert and oriented to person, place, and time.      ED Treatments / Results  Labs (all labs ordered are listed, but only abnormal results are displayed) Labs Reviewed  BASIC METABOLIC PANEL  CBC  TROPONIN I (HIGH SENSITIVITY)  I-STAT BETA HCG BLOOD, ED ([facility], [facility], [facility] ONLY)    EKG EKG Interpretation  Date/Time:  Friday March 11 2019 14:19:32 EDT Ventricular Rate:  119 PR Interval:  144 QRS Duration: 70 QT Interval:  324 QTC Calculation: 455 R Axis:   74 Text Interpretation:  Sinus tachycardia Right atrial enlargement Nonspecific ST abnormality Abnormal ECG No acute changes No significant change since last tracing Confirmed by Derwood Kaplananavati, Edwyn Inclan [phone]([id number]) on 03/11/2019 4:26:11 PM   Radiology Dg Chest 2 View  Result Date: 03/11/2019 CLINICAL DATA:  Palpitations EXAM: CHEST - 2 VIEW COMPARISON:  08/18/2014 FINDINGS: The heart size and mediastinal contours are within normal limits. Both lungs are clear. The visualized skeletal structures are unremarkable. IMPRESSION: No acute abnormality of the lungs.  No focal airspace opacity. Electronically  Signed   By: Lauralyn PrimesAlex  Bibbey M.D.   On: 03/11/2019 15:52    Procedures Procedures (including critical care time)  Medications Ordered in ED Medications  sodium chloride flush (NS) 0.9 % injection 3 mL (has no administration in time range)  LORazepam (ATIVAN) tablet 1 mg (1 mg Oral Given 03/11/19 1644)     Initial Impression / Assessment and Plan / ED Course  I have reviewed the  triage vital signs and the nursing notes.  Pertinent labs & imaging results that were available during my care of the patient were reviewed by me and considered in my medical decision making (see chart for details).        38 year old female comes in a chief complaint of elevated heart rate.  Patient is noted to be tachycardic, and there is history of anxiety and tachycardia.  Patient takes as needed Klonopin and also scheduled metoprolol.  There is been no changes to her medication, but she does state that she sparingly takes Klonopin.  On my evaluation I noted that when I walked in patient's heart rate was in the 90s, but it instantly jumped to 115-1 20.  She was also shaking.  Upon further asking she reported that she was shaking because she was anxious and nervous.  The history and exam are not suggestive of any underlying tachydysrhythmia at this time and she is really not symptomatic from her elevated heart rate.  I gave her Ativan and her heart rate dropped into the 80s on my reassessment.  For now the plan is for patient to be discharged with as needed Ativan and PCP follow-up for further evaluation for her anxiety.  I do not see any reason to change her Toprol dose.  Last visit with Dr. Stanford Breed, cardiology reviewed.  He to thought that patient's anxiety was a major issue in patient's symptoms.  Final Clinical Impressions(s) / ED Diagnoses   Final diagnoses:  Palpitations  Anxiety    ED Discharge Orders         Ordered    LORazepam (ATIVAN) 1 MG tablet  2 times daily PRN     03/11/19 1751            Varney Biles, MD 03/11/19 1814

## 2019-03-28 NOTE — Progress Notes (Deleted)
HPI: FU palpitations. Patient had an echocardiogram in October of 2011. Her ejection fraction was 65-70% and there was grade 1 diastolic dysfunction. There was no significant Doppler abnormality. Previous monitor in October of 2011 showed no significant arrhythmias. Cardionet 3/13 revealed sinus to sinus tachycardia with PACs. Repeat 3/16 showed sinus with pacs.  Holter monitor repeated August 2019.  There was sinus bradycardia, normal sinus rhythm, sinus tachycardia, occasional PAC and rare PVC.  Laboratories August 2019 showed normal TSH, negative troponin and normal hemoglobin.  Since last seen,   Current Outpatient Medications  Medication Sig Dispense Refill  . acetaminophen (TYLENOL) 500 MG tablet Take 1,000 mg by mouth every 6 (six) hours as needed for mild pain.    Marland Kitchen KLONOPIN 0.5 MG tablet Take 0.5 mg by mouth 3 (three) times daily as needed for anxiety.     Marland Kitchen LORazepam (ATIVAN) 1 MG tablet Take 1 tablet (1 mg total) by mouth 2 (two) times daily as needed for anxiety. 10 tablet 0  . metoprolol succinate (TOPROL-XL) 50 MG 24 hr tablet TAKE 2 TABLETS(100 MG) BY MOUTH DAILY 180 tablet 0  . Omega-3 Fatty Acids (FISH OIL PO) Take 2,000 mg by mouth daily.     No current facility-administered medications for this visit.      Past Medical History:  Diagnosis Date  . Anxiety   . GERD (gastroesophageal reflux disease)   . Heart murmur   . History of Holter monitoring 06/2010 and 11/2011   Cardionet montior with sinus tachycardia, PAC's only, no arrhythmias either monitor  . Hx of echocardiogram 06/2010   normal  . Palpitations     Past Surgical History:  Procedure Laterality Date  . DENTAL SURGERY    . INDUCED ABORTION      Social History   Socioeconomic History  . Marital status: Single    Spouse name: Not on file  . Number of children: Not on file  . Years of education: Not on file  . Highest education level: Not on file  Occupational History  . Occupation: [employer]  . Financial resource strain: Not on file  . Food insecurity    Worry: Not on file    Inability: Not on file  . Transportation needs    Medical: Not on file    Non-medical: Not on file  Tobacco Use  . Smoking status: Never Smoker  . Smokeless tobacco: Never Used  Substance and Sexual Activity  . Alcohol use: No    Alcohol/week: 0.0 standard drinks  . Drug use: No  . Sexual activity: Not Currently    Birth control/protection: Condom  Lifestyle  . Physical activity    Days per week: Not on file    Minutes per session: Not on file  . Stress: Not on file  Relationships  . Social Herbalist on phone: Not on file    Gets together: Not on file    Attends religious service: Not on file    Active member of club or organization: Not on file    Attends meetings of clubs or organizations: Not on file    Relationship status: Not on file  . Intimate partner violence    Fear of current or ex partner: Not on file    Emotionally abused: Not on file    Physically abused: Not on file    Forced sexual activity: Not on file  Other Topics Concern  . Not on file  Social History Narrative   Lives with 2 children in an apartment on the second floor.     Works at [employer].  Education: high school.    Family History  Problem Relation Age of Onset  . Hypertension Other   . CAD Other   . Diabetes Maternal Grandmother   . Breast cancer Maternal Grandmother   . Healthy Son   . Healthy Sister        x 6  . Healthy Sister        x 2  . Healthy Son     ROS: no fevers or chills, productive cough, hemoptysis, dysphasia, odynophagia, melena, hematochezia, dysuria, hematuria, rash, seizure activity, orthopnea, PND, pedal edema, claudication. Remaining systems are negative.  Physical Exam: Well-developed well-nourished in no acute distress.  Skin is warm and dry.  HEENT is normal.  Neck is supple.  Chest is clear to auscultation with normal expansion.  Cardiovascular  exam is regular rate and rhythm.  Abdominal exam nontender or distended. No masses palpated. Extremities show no edema. neuro grossly intact  ECG- personally reviewed  A/P  1 palpitations-patient continues to have palpitations.  Monitors are outlined above.  She appears to have significant anxiety contributing to her palpitations.  We will continue with beta-blocker.  2 anxiety-follow-up primary care.  Olga MillersBrian Crenshaw, MD

## 2019-04-05 ENCOUNTER — Telehealth: Payer: Self-pay | Admitting: Cardiology

## 2019-04-05 ENCOUNTER — Telehealth: Payer: BC Managed Care – PPO | Admitting: Cardiology

## 2019-04-05 NOTE — Telephone Encounter (Signed)
LVM, reminding pt of her appt on 04-05-19 with Dr Stanford Breed. I also asked pt to call back to give verbal consent for her Virtual Visit.

## 2019-05-03 NOTE — Progress Notes (Signed)
HPI: FU palpitations. Patient had an echocardiogram in October of 2011. Her ejection fraction was 65-70% and there was grade 1 diastolic dysfunction. There was no significant Doppler abnormality. Previous monitor in October of 2011 showed no significant arrhythmias. Cardionet 3/13 revealed sinus to sinus tachycardia with PACs. Repeat 3/16 showed sinus with pacs.  Holter monitor repeated August 2019.  There was sinus bradycardia, normal sinus rhythm, sinus tachycardia, occasional PAC and rare PVC.  Patient seen in the emergency room June 2020 with palpitations.  Hemoglobin 13.3.  Troponin negative.  Potassium 3.5.  Electrocardiogram showed sinus tachycardia.  Since last seen,palpitations are reasonably well controlled.  There is no dyspnea.  Occasional chest pain for 30 seconds unrelated to exertion.  Current Outpatient Medications  Medication Sig Dispense Refill  . acetaminophen (TYLENOL) 500 MG tablet Take 1,000 mg by mouth every 6 (six) hours as needed for mild pain.    Marland Kitchen. KLONOPIN 0.5 MG tablet Take 0.5 mg by mouth 3 (three) times daily as needed for anxiety.     Marland Kitchen. LORazepam (ATIVAN) 1 MG tablet Take 1 tablet (1 mg total) by mouth 2 (two) times daily as needed for anxiety. 10 tablet 0  . metoprolol succinate (TOPROL-XL) 50 MG 24 hr tablet TAKE 2 TABLETS(100 MG) BY MOUTH DAILY 180 tablet 0  . Omega-3 Fatty Acids (FISH OIL PO) Take 2,000 mg by mouth daily.     No current facility-administered medications for this visit.      Past Medical History:  Diagnosis Date  . Anxiety   . GERD (gastroesophageal reflux disease)   . Heart murmur   . History of Holter monitoring 06/2010 and 11/2011   Cardionet montior with sinus tachycardia, PAC's only, no arrhythmias either monitor  . Hx of echocardiogram 06/2010   normal  . Palpitations     Past Surgical History:  Procedure Laterality Date  . DENTAL SURGERY    . INDUCED ABORTION      Social History   Socioeconomic History  . Marital  status: Single    Spouse name: Not on file  . Number of children: Not on file  . Years of education: Not on file  . Highest education level: Not on file  Occupational History  . Occupation: [employer]  Social Needs  . Financial resource strain: Not on file  . Food insecurity    Worry: Not on file    Inability: Not on file  . Transportation needs    Medical: Not on file    Non-medical: Not on file  Tobacco Use  . Smoking status: Never Smoker  . Smokeless tobacco: Never Used  Substance and Sexual Activity  . Alcohol use: No    Alcohol/week: 0.0 standard drinks  . Drug use: No  . Sexual activity: Not Currently    Birth control/protection: Condom  Lifestyle  . Physical activity    Days per week: Not on file    Minutes per session: Not on file  . Stress: Not on file  Relationships  . Social Musicianconnections    Talks on phone: Not on file    Gets together: Not on file    Attends religious service: Not on file    Active member of club or organization: Not on file    Attends meetings of clubs or organizations: Not on file    Relationship status: Not on file  . Intimate partner violence    Fear of current or ex partner: Not on file  Emotionally abused: Not on file    Physically abused: Not on file    Forced sexual activity: Not on file  Other Topics Concern  . Not on file  Social History Narrative   Lives with 2 children in an apartment on the second floor.     Works at [employer].  Education: high school.    Family History  Problem Relation Age of Onset  . Hypertension Other   . CAD Other   . Diabetes Maternal Grandmother   . Breast cancer Maternal Grandmother   . Healthy Son   . Healthy Sister        x 6  . Healthy Sister        x 2  . Healthy Son     ROS: no fevers or chills, productive cough, hemoptysis, dysphasia, odynophagia, melena, hematochezia, dysuria, hematuria, rash, seizure activity, orthopnea, PND, pedal edema, claudication. Remaining systems are negative.   Physical Exam: Well-developed well-nourished in no acute distress.  Skin is warm and dry.  HEENT is normal.  Neck is supple.  Chest is clear to auscultation with normal expansion.  Cardiovascular exam is regular rate and rhythm.  Abdominal exam nontender or distended. No masses palpated. Extremities show no edema. neuro grossly intact  ECG-sinus tachycardia at a rate of 107, no significant ST changes.  Personally reviewed  A/P  1 palpitations-patient continues with palpitations.  Previous monitors have showed sinus to sinus tachycardia with PACs and PVCs.  Continue Toprol.  Repeat echocardiogram.  2 atypical chest pain-symptoms very atypical.  Electrocardiogram without diagnostic ST changes.  No further ischemia evaluation at this time.  3 anxiety-follow-up primary care.  Kirk Ruths, MD

## 2019-05-09 ENCOUNTER — Ambulatory Visit: Payer: BC Managed Care – PPO | Admitting: Cardiology

## 2019-05-09 ENCOUNTER — Other Ambulatory Visit: Payer: Self-pay

## 2019-05-09 ENCOUNTER — Encounter: Payer: Self-pay | Admitting: Cardiology

## 2019-05-09 VITALS — BP 122/76 | HR 107 | Temp 97.9°F | Ht 65.0 in | Wt 110.0 lb

## 2019-05-09 DIAGNOSIS — R002 Palpitations: Secondary | ICD-10-CM | POA: Diagnosis not present

## 2019-05-09 DIAGNOSIS — R0789 Other chest pain: Secondary | ICD-10-CM | POA: Diagnosis not present

## 2019-05-09 NOTE — Patient Instructions (Signed)

## 2019-05-12 ENCOUNTER — Other Ambulatory Visit: Payer: Self-pay

## 2019-05-12 ENCOUNTER — Ambulatory Visit (HOSPITAL_COMMUNITY)
Admission: RE | Admit: 2019-05-12 | Discharge: 2019-05-12 | Disposition: A | Discharge status: Home / Self Care | Payer: BC Managed Care – PPO | Source: Ambulatory Visit | Attending: Cardiology | Admitting: Cardiology

## 2019-05-12 DIAGNOSIS — R002 Palpitations: Secondary | ICD-10-CM | POA: Diagnosis not present

## 2019-05-12 DIAGNOSIS — R008 Other abnormalities of heart beat: Secondary | ICD-10-CM

## 2019-05-12 NOTE — Progress Notes (Signed)
*  PRELIMINARY RESULTS* Echocardiogram 2D Echocardiogram has been performed.  Paula Riley 05/12/2019, 4:01 PM

## 2019-06-03 ENCOUNTER — Other Ambulatory Visit: Payer: Self-pay | Admitting: Physician Assistant

## 2019-06-03 ENCOUNTER — Telehealth: Payer: Self-pay | Admitting: Physician Assistant

## 2019-06-03 MED ORDER — METOPROLOL SUCCINATE ER 50 MG PO TB24: ORAL_TABLET | ORAL | 1 refills | Status: DC

## 2019-06-03 NOTE — Telephone Encounter (Signed)
Paula Riley called because she is almost out of her metoprolol and requests a refill.  I sent in a refill for 90-day supply with 1 refill on it.  Rosaria Ferries, PA-C 06/03/2019 6:26 PM Beeper [phone]

## 2019-07-20 ENCOUNTER — Telehealth: Payer: Self-pay | Admitting: Obstetrics & Gynecology

## 2019-07-20 NOTE — Telephone Encounter (Signed)
Tried to reach the patient to remind her of her appointment/restrictions, mailbox is full. 

## 2019-07-21 ENCOUNTER — Ambulatory Visit (INDEPENDENT_AMBULATORY_CARE_PROVIDER_SITE_OTHER): Payer: BC Managed Care – PPO | Admitting: *Deleted

## 2019-07-21 ENCOUNTER — Other Ambulatory Visit: Payer: Self-pay

## 2019-07-21 ENCOUNTER — Encounter: Payer: Self-pay | Admitting: *Deleted

## 2019-07-21 ENCOUNTER — Other Ambulatory Visit: Payer: Self-pay | Admitting: Advanced Practice Midwife

## 2019-07-21 VITALS — BP 124/76 | HR 97 | Ht 65.0 in | Wt 111.8 lb

## 2019-07-21 DIAGNOSIS — N926 Irregular menstruation, unspecified: Secondary | ICD-10-CM

## 2019-07-21 DIAGNOSIS — Z3201 Encounter for pregnancy test, result positive: Secondary | ICD-10-CM

## 2019-07-21 LAB — POCT URINE PREGNANCY: Preg Test, Ur: POSITIVE — AB

## 2019-07-21 MED ORDER — PROMETHAZINE HCL 25 MG RE SUPP: 25.0000 mg | Freq: Four times a day (QID) | RECTAL | 0 refills | Status: DC | PRN

## 2019-07-21 NOTE — Progress Notes (Addendum)
   NURSE VISIT- PREGNANCY CONFIRMATION   SUBJECTIVE:  Paula Riley is a 38 y.o. [phone] female at [redacted]w[redacted]d by certain LMP of Patient's last menstrual period was 06/08/2019 (exact date). Here for pregnancy confirmation.  Home pregnancy test: positive x 2  She reports nausea and vomiting.  She is not taking prenatal vitamins.    OBJECTIVE:  BP 124/76 (BP Location: Left Arm, Patient Position: Sitting, Cuff Size: Normal)   Pulse 97   Ht 5\' 5"  (1.651 m)   Wt 111 lb 12.8 oz (50.7 kg)   LMP 06/08/2019 (Exact Date)   BMI 18.60 kg/m   Appears well, in no apparent distress OB History  Gravida Para Term Preterm AB Living  4 2 2   1 2   SAB TAB Ectopic Multiple Live Births    1     2    # Outcome Date GA Lbr Len/2nd Weight Sex Delivery Anes PTL Lv  4 Current           3 Term 03/10/11 [redacted]w[redacted]d  5 lb 11 oz (2.58 kg) M Vag-Spont   LIV  2 Term 07/20/06    M Vag-Spont EPI  LIV  1 TAB             Results for orders placed or performed in visit on 07/21/19 (from the past 24 hour(s))  POCT urine pregnancy   Collection Time: 07/21/19  1:40 PM  Result Value Ref Range   Preg Test, Ur Positive (A) Negative    ASSESSMENT: Positive pregnancy test, [redacted]w[redacted]d by LMP    PLAN: Schedule for dating ultrasound in 2 weeks Prenatal vitamins: note routed to [place] to send prescription   Nausea medicines: requested-note routed to [place] to send prescription   OB packet given: Yes  Rash, Celene Squibb  07/21/2019 1:48 PM   Chart reviewed for nurse visit. Agree with plan of care. Rx'd phenergan suppositories to try.  May go to ED for IVF if unable to keep fluids donw.  Christin Fudge, [place] 07/27/2019 8:08 PM    rx'd phenergan suppositories.  Go to [facility] for IVF if doesn't work. Urine mod ketones.

## 2019-07-21 NOTE — Progress Notes (Signed)
Phenergan suppositories--advised to go to [facility] but wants to try this first. Urine mod ketones.

## 2019-07-22 ENCOUNTER — Telehealth: Payer: Self-pay | Admitting: *Deleted

## 2019-07-22 NOTE — Telephone Encounter (Signed)
Patient left message wanting to know if she can take Tums for heartburn.

## 2019-07-22 NOTE — Telephone Encounter (Signed)
Patient states she has been vomiting and thinks that is causing the acid reflux.  Advised she can take Tums and to take nausea medication as this may help with the nausea and acid reflux.  Pt verbalized understanding.

## 2019-08-02 ENCOUNTER — Telehealth: Payer: Self-pay | Admitting: Obstetrics & Gynecology

## 2019-08-02 ENCOUNTER — Other Ambulatory Visit: Payer: Self-pay | Admitting: Obstetrics and Gynecology

## 2019-08-02 DIAGNOSIS — O3680X0 Pregnancy with inconclusive fetal viability, not applicable or unspecified: Secondary | ICD-10-CM

## 2019-08-02 NOTE — Telephone Encounter (Signed)
Tried to reach the patient to remind her of her appointment/restrictions, mailbox is full. 

## 2019-08-03 ENCOUNTER — Telehealth: Payer: Self-pay | Admitting: *Deleted

## 2019-08-03 ENCOUNTER — Other Ambulatory Visit: Payer: Self-pay

## 2019-08-03 ENCOUNTER — Ambulatory Visit (INDEPENDENT_AMBULATORY_CARE_PROVIDER_SITE_OTHER): Payer: BC Managed Care – PPO

## 2019-08-03 DIAGNOSIS — Z3A08 8 weeks gestation of pregnancy: Secondary | ICD-10-CM

## 2019-08-03 DIAGNOSIS — O3680X0 Pregnancy with inconclusive fetal viability, not applicable or unspecified: Secondary | ICD-10-CM | POA: Diagnosis not present

## 2019-08-03 MED ORDER — PROMETHAZINE HCL 25 MG PO TABS: 25.0000 mg | ORAL_TABLET | Freq: Four times a day (QID) | ORAL | 1 refills | Status: DC | PRN

## 2019-08-03 NOTE — Telephone Encounter (Signed)
Will rx phenergan po

## 2019-08-03 NOTE — Progress Notes (Signed)
[place] 8 wks,single IUP w/ys,positive fht 176 bpm,subchorionic hemorrhage surrounding gestational sac 8.8 x 2.4 x 2.6 cm,left complex corpus luteal cyst 2.5 x 2.4 x 1.6 cm,normal right ovary

## 2019-08-03 NOTE — Telephone Encounter (Signed)
Pt has Phenergan supp. but would rather have pills. Can you send to pharmacy? Thanks!! [place]

## 2019-08-10 ENCOUNTER — Telehealth: Payer: Self-pay | Admitting: *Deleted

## 2019-08-10 ENCOUNTER — Other Ambulatory Visit: Payer: Self-pay | Admitting: Medical

## 2019-08-10 DIAGNOSIS — O219 Vomiting of pregnancy, unspecified: Secondary | ICD-10-CM

## 2019-08-10 MED ORDER — SCOPOLAMINE 1 MG/3DAYS TD PT72: 1.0000 | MEDICATED_PATCH | TRANSDERMAL | 1 refills | Status: DC

## 2019-08-10 NOTE — Telephone Encounter (Signed)
Patient states the nausea medication is not working and is making her worse.  She has been in bed for 2 days.  Requesting a call back.

## 2019-08-10 NOTE — Telephone Encounter (Signed)
Patient informed Almyra Free will send in scop patch but she can also take Unisom(doxylamine succinate 25 mg tablets) Take one tablet daily at bedtime. If symptoms are not adequately controlled, the dose can be increased to a maximum recommended dose of two tablets daily (1/2 tablet in the morning, 1/2 tablet mid-afternoon and one at bedtime) and Vitamin B6 100mg  tablets. Take one tablet twice a day (up to 200 mg per day).  Advised to monitor for signs of dehydration and [facility] she felt like she needed [facility] fluids.  Pt verbalized understanding with no further questions.

## 2019-08-10 NOTE — Telephone Encounter (Signed)
Early pregnant (approx 9 weeks) states she is continuing to have nausea and vomiting despite taking her Phenergan.  States the suppositories did not work and the tablets make her worse.  She is able to keep some liquids down and is voiding.  Encouraged to continue to sip fluids to avoid dehydration.  She is requesting a different medication be sent to her pharmacy. Please advise

## 2019-08-31 ENCOUNTER — Other Ambulatory Visit: Payer: Self-pay | Admitting: Obstetrics & Gynecology

## 2019-08-31 DIAGNOSIS — Z3682 Encounter for antenatal screening for nuchal translucency: Secondary | ICD-10-CM

## 2019-09-01 ENCOUNTER — Other Ambulatory Visit: Payer: Self-pay

## 2019-09-01 ENCOUNTER — Other Ambulatory Visit (HOSPITAL_COMMUNITY)
Admission: RE | Admit: 2019-09-01 | Discharge: 2019-09-01 | Disposition: A | Discharge status: Home / Self Care | Payer: BC Managed Care – PPO | Source: Ambulatory Visit | Attending: Advanced Practice Midwife | Admitting: Advanced Practice Midwife

## 2019-09-01 ENCOUNTER — Encounter: Payer: Self-pay | Admitting: Advanced Practice Midwife

## 2019-09-01 ENCOUNTER — Ambulatory Visit (INDEPENDENT_AMBULATORY_CARE_PROVIDER_SITE_OTHER): Payer: BC Managed Care – PPO | Admitting: Advanced Practice Midwife

## 2019-09-01 ENCOUNTER — Ambulatory Visit (INDEPENDENT_AMBULATORY_CARE_PROVIDER_SITE_OTHER): Payer: BC Managed Care – PPO

## 2019-09-01 ENCOUNTER — Ambulatory Visit: Payer: BC Managed Care – PPO | Admitting: *Deleted

## 2019-09-01 VITALS — Wt 119.0 lb

## 2019-09-01 DIAGNOSIS — Z3682 Encounter for antenatal screening for nuchal translucency: Secondary | ICD-10-CM

## 2019-09-01 DIAGNOSIS — Z124 Encounter for screening for malignant neoplasm of cervix: Secondary | ICD-10-CM | POA: Insufficient documentation

## 2019-09-01 DIAGNOSIS — Z1371 Encounter for nonprocreative screening for genetic disease carrier status: Secondary | ICD-10-CM

## 2019-09-01 DIAGNOSIS — Z3A12 12 weeks gestation of pregnancy: Secondary | ICD-10-CM

## 2019-09-01 DIAGNOSIS — Z36 Encounter for antenatal screening for chromosomal anomalies: Secondary | ICD-10-CM

## 2019-09-01 DIAGNOSIS — O0990 Supervision of high risk pregnancy, unspecified, unspecified trimester: Secondary | ICD-10-CM | POA: Insufficient documentation

## 2019-09-01 DIAGNOSIS — Z23 Encounter for immunization: Secondary | ICD-10-CM

## 2019-09-01 DIAGNOSIS — Z1389 Encounter for screening for other disorder: Secondary | ICD-10-CM

## 2019-09-01 DIAGNOSIS — Z331 Pregnant state, incidental: Secondary | ICD-10-CM

## 2019-09-01 DIAGNOSIS — Z3481 Encounter for supervision of other normal pregnancy, first trimester: Secondary | ICD-10-CM

## 2019-09-01 DIAGNOSIS — Z1379 Encounter for other screening for genetic and chromosomal anomalies: Secondary | ICD-10-CM

## 2019-09-01 LAB — POCT URINALYSIS DIPSTICK OB
Blood, UA: NEGATIVE
Glucose, UA: NEGATIVE
Ketones, UA: NEGATIVE
Leukocytes, UA: NEGATIVE
Nitrite, UA: NEGATIVE
POC,PROTEIN,UA: NEGATIVE

## 2019-09-01 MED ORDER — OMEPRAZOLE 20 MG PO CPDR: 20.0000 mg | DELAYED_RELEASE_CAPSULE | Freq: Every day | ORAL | 6 refills | Status: DC

## 2019-09-01 MED ORDER — BLOOD PRESSURE MONITOR MISC: 0 refills | Status: DC

## 2019-09-01 NOTE — Patient Instructions (Signed)
[facility] [phone]  Call Paula Riley in a week or so to set up therapy if you don't hear from her.      First Trimester of Pregnancy The first trimester of pregnancy is from week 1 until the end of week 12 (months 1 through 3). A week after a sperm fertilizes an egg, the egg will implant on the wall of the uterus. This embryo will begin to develop into a baby. Genes from you and your partner are forming the baby. The female genes determine whether the baby is a boy or a girl. At 6-8 weeks, the eyes and face are formed, and the heartbeat can be seen on ultrasound. At the end of 12 weeks, all the baby's organs are formed.  Now that you are pregnant, you will want to do everything you can to have a healthy baby. Two of the most important things are to get good prenatal care and to follow your health care provider's instructions. Prenatal care is all the medical care you receive before the baby's birth. This care will help prevent, find, and treat any problems during the pregnancy and childbirth. BODY CHANGES Your body goes through many changes during pregnancy. The changes vary from woman to woman.   You may gain or lose a couple of pounds at first.  You may feel sick to your stomach (nauseous) and throw up (vomit). If the vomiting is uncontrollable, call your health care provider.  You may tire easily.  You may develop headaches that can be relieved by medicines approved by your health care provider.  You may urinate more often. Painful urination may mean you have a bladder infection.  You may develop heartburn as a result of your pregnancy.  You may develop constipation because certain hormones are causing the muscles that push waste through your intestines to slow down.  You may develop hemorrhoids or swollen, bulging veins (varicose veins).  Your breasts may begin to grow larger and become tender. Your nipples may stick out more, and the tissue that surrounds them  (areola) may become darker.  Your gums may bleed and may be sensitive to brushing and flossing.  Dark spots or blotches (chloasma, mask of pregnancy) may develop on your face. This will likely fade after the baby is born.  Your menstrual periods will stop.  You may have a loss of appetite.  You may develop cravings for certain kinds of food.  You may have changes in your emotions from day to day, such as being excited to be pregnant or being concerned that something may go wrong with the pregnancy and baby.  You may have more vivid and strange dreams.  You may have changes in your hair. These can include thickening of your hair, rapid growth, and changes in texture. Some women also have hair loss during or after pregnancy, or hair that feels dry or thin. Your hair will most likely return to normal after your baby is born. WHAT TO EXPECT AT YOUR PRENATAL VISITS During a routine prenatal visit:  You will be weighed to make sure you and the baby are growing normally.  Your blood pressure will be taken.  Your abdomen will be measured to track your baby's growth.  The fetal heartbeat will be listened to starting around week 10 or 12 of your pregnancy.  Test results from any previous visits will be discussed. Your health care provider may ask you:  How you are feeling.  If you are feeling the  baby move.  If you have had any abnormal symptoms, such as leaking fluid, bleeding, severe headaches, or abdominal cramping.  If you have any questions. Other tests that may be performed during your first trimester include:  Blood tests to find your blood type and to check for the presence of any previous infections. They will also be used to check for low iron levels (anemia) and Rh antibodies. Later in the pregnancy, blood tests for diabetes will be done along with other tests if problems develop.  Urine tests to check for infections, diabetes, or protein in the urine.  An ultrasound to  confirm the proper growth and development of the baby.  An amniocentesis to check for possible genetic problems.  Fetal screens for spina bifida and Down syndrome.  You may need other tests to make sure you and the baby are doing well. HOME CARE INSTRUCTIONS  Medicines  Follow your health care provider's instructions regarding medicine use. Specific medicines may be either safe or unsafe to take during pregnancy.  Take your prenatal vitamins as directed.  If you develop constipation, try taking a stool softener if your health care provider approves. Diet  Eat regular, well-balanced meals. Choose a variety of foods, such as meat or vegetable-based protein, fish, milk and low-fat dairy products, vegetables, fruits, and whole grain breads and cereals. Your health care provider will help you determine the amount of weight gain that is right for you.  Avoid raw meat and uncooked cheese. These carry germs that can cause birth defects in the baby.  Eating four or five small meals rather than three large meals a day may help relieve nausea and vomiting. If you start to feel nauseous, eating a few soda crackers can be helpful. Drinking liquids between meals instead of during meals also seems to help nausea and vomiting.  If you develop constipation, eat more high-fiber foods, such as fresh vegetables or fruit and whole grains. Drink enough fluids to keep your urine clear or pale yellow. Activity and Exercise  Exercise only as directed by your health care provider. Exercising will help you:  Control your weight.  Stay in shape.  Be prepared for labor and delivery.  Experiencing pain or cramping in the lower abdomen or low back is a good sign that you should stop exercising. Check with your health care provider before continuing normal exercises.  Try to avoid standing for long periods of time. Move your legs often if you must stand in one place for a long time.  Avoid heavy  lifting.  Wear low-heeled shoes, and practice good posture.  You may continue to have sex unless your health care provider directs you otherwise. Relief of Pain or Discomfort  Wear a good support bra for breast tenderness.   Take warm sitz baths to soothe any pain or discomfort caused by hemorrhoids. Use hemorrhoid cream if your health care provider approves.   Rest with your legs elevated if you have leg cramps or low back pain.  If you develop varicose veins in your legs, wear support hose. Elevate your feet for 15 minutes, 3-4 times a day. Limit salt in your diet. Prenatal Care  Schedule your prenatal visits by the twelfth week of pregnancy. They are usually scheduled monthly at first, then more often in the last 2 months before delivery.  Write down your questions. Take them to your prenatal visits.  Keep all your prenatal visits as directed by your health care provider. Safety  Wear your seat  belt at all times when driving.  Make a list of emergency phone numbers, including numbers for family, friends, the hospital, and police and fire departments. General Tips  Ask your health care provider for a referral to a local prenatal education class. Begin classes no later than at the beginning of month 6 of your pregnancy.  Ask for help if you have counseling or nutritional needs during pregnancy. Your health care provider can offer advice or refer you to specialists for help with various needs.  Do not use hot tubs, steam rooms, or saunas.  Do not douche or use tampons or scented sanitary pads.  Do not cross your legs for long periods of time.  Avoid cat litter boxes and soil used by cats. These carry germs that can cause birth defects in the baby and possibly loss of the fetus by miscarriage or stillbirth.  Avoid all smoking, herbs, alcohol, and medicines not prescribed by your health care provider. Chemicals in these affect the formation and growth of the baby.  Schedule  a dentist appointment. At home, brush your teeth with a soft toothbrush and be gentle when you floss. SEEK MEDICAL CARE IF:   You have dizziness.  You have mild pelvic cramps, pelvic pressure, or nagging pain in the abdominal area.  You have persistent nausea, vomiting, or diarrhea.  You have a bad smelling vaginal discharge.  You have pain with urination.  You notice increased swelling in your face, hands, legs, or ankles. SEEK IMMEDIATE MEDICAL CARE IF:   You have a fever.  You are leaking fluid from your vagina.  You have spotting or bleeding from your vagina.  You have severe abdominal cramping or pain.  You have rapid weight gain or loss.  You vomit blood or material that looks like coffee grounds.  You are exposed to [place] measles and have never had them.  You are exposed to fifth disease or chickenpox.  You develop a severe headache.  You have shortness of breath.  You have any kind of trauma, such as from a fall or a car accident. Document Released: 08/26/2001 Document Revised: 01/16/2014 Document Reviewed: 07/12/2013 [facility] Patient Information 2015 [place], [place]. This information is not intended to replace advice given to you by your health care provider. Make sure you discuss any questions you have with your health care provider.   Nausea & Vomiting  Have saltine crackers or pretzels by your bed and eat a few bites before you raise your head out of bed in the morning  Eat small frequent meals throughout the day instead of large meals  Drink plenty of fluids throughout the day to stay hydrated, just don't drink a lot of fluids with your meals.  This can make your stomach fill up faster making you feel sick  Do not brush your teeth right after you eat  Products with real ginger are good for nausea, like ginger ale and ginger hard candy Make sure it says made with real ginger!  Sucking on sour candy like lemon heads is also good for nausea  If your  prenatal vitamins make you nauseated, take them at night so you will sleep through the nausea  Sea Bands  If you feel like you need medicine for the nausea & vomiting please let us know  If you are unable to keep any fluids or food down please let us know   Constipation  Drink plenty of fluid, preferably water, throughout the day  Eat foods high in  fiber such as fruits, vegetables, and grains  Exercise, such as walking, is a good way to keep your bowels regular  Drink warm fluids, especially warm prune juice, or decaf coffee  Eat a 1/2 cup of real oatmeal (not instant), 1/2 cup applesauce, and 1/2-1 cup warm prune juice every day  If needed, you may take Colace (docusate sodium) stool softener once or twice a day to help keep the stool soft. If you are pregnant, wait until you are out of your first trimester (12-14 weeks of pregnancy)  If you still are having problems with constipation, you may take Miralax once daily as needed to help keep your bowels regular.  If you are pregnant, wait until you are out of your first trimester (12-14 weeks of pregnancy)  Safe Medications in Pregnancy   Acne: Benzoyl Peroxide Salicylic Acid  Backache/Headache: Tylenol: 2 regular strength every 4 hours OR              2 Extra strength every 6 hours  Colds/Coughs/Allergies: Benadryl (alcohol free) 25 mg every 6 hours as needed Breath right strips Claritin Cepacol throat lozenges Chloraseptic throat spray Cold-Eeze- up to three times per day Cough drops, alcohol free Flonase (by prescription only) Guaifenesin Mucinex Robitussin DM (plain only, alcohol free) Saline nasal spray/drops Sudafed (pseudoephedrine) & Actifed ** use only after [redacted] weeks gestation and if you do not have high blood pressure Tylenol Vicks Vaporub Zinc lozenges Zyrtec   Constipation: Colace Ducolax suppositories Fleet enema Glycerin suppositories Metamucil Milk of magnesia Miralax Senokot Smooth move  tea  Diarrhea: Kaopectate Imodium A-D  *NO pepto Bismol  Hemorrhoids: Anusol Anusol HC Preparation H Tucks  Indigestion: Tums Maalox Mylanta Zantac  Pepcid  Insomnia: Benadryl (alcohol free)  every 6 hours as needed Tylenol PM Unisom, no Gelcaps  Leg Cramps: Tums MagGel  Nausea/Vomiting:  Bonine Dramamine Emetrol Ginger extract Sea bands Meclizine  Nausea medication to take during pregnancy:  Unisom (doxylamine succinate 25 mg tablets) Take one tablet daily at bedtime. If symptoms are not adequately controlled, the dose can be increased to a maximum recommended dose of two tablets daily (1/2 tablet in the morning, 1/2 tablet mid-afternoon and one at bedtime). Vitamin B6  tablets. Take one tablet twice a day (up to 200 mg per day).  Skin Rashes: Aveeno products Benadryl cream or  every 6 hours as needed Calamine Lotion 1% cortisone cream  Yeast infection: Gyne-lotrimin 7 Monistat 7   **If taking multiple medications, please check labels to avoid duplicating the same active ingredients **take medication as directed on the label ** Do not exceed 4000 mg of tylenol in 24 hours **Do not take medications that contain aspirin or ibuprofen

## 2019-09-01 NOTE — Progress Notes (Signed)
[place] 12+1 wks,measurements c/w dates,crl 61.03 mm,NB present,NT 1.1 mm,posterior placenta,normal ovaries,subchorionic hemorrhage extending behind both tips of the placenta, inferior [place] 4.8 x .8 x 2.8,fundal [place] 4.6 x 1.7 x 2.1 cm,fhr 158 bpm

## 2019-09-01 NOTE — Progress Notes (Signed)
INITIAL OBSTETRICAL VISIT Patient name: Paula Riley MRN [id number]  Date of birth: 12/24/1980 Chief Complaint:   Initial Prenatal Visit (nt/it, nausea)  History of Present Illness:   Paula Riley is a 38 y.o. [id number] African AmericanAfrican American female at [redacted]w[redacted]d by LMP/early [place] with an Estimated Date of Delivery: 03/14/20 being seen today for her initial obstetrical visit.   Her obstetrical history is significant for 2 term SVDs without problems. Having some nausea and reflux  Meds are helping, but still has reflux. Today she reports lots of anxiety. Had taken ativan at one time, but not since pregnancy.  Very nervous here in office.  Offered and accepted therapy referral.  On Toprol 100mg  for tachycardia. Discussed how IUGR could be an issue--very nervous about switching to labetalol. Will revisit 3rd trimester. .  Patient's last menstrual period was 06/08/2019 (exact date). Last pap 3 years ago. Results were: normal Review of Systems:   Pertinent items are noted in HPI Denies cramping/contractions, leakage of fluid, vaginal bleeding, abnormal vaginal discharge w/ itching/odor/irritation, headaches, visual changes, shortness of breath, chest pain, abdominal pain, severe nausea/vomiting, or problems with urination or bowel movements unless otherwise stated above.  Pertinent History Reviewed:  Reviewed past medical,surgical, social, obstetrical and family history.  Reviewed problem list, medications and allergies. OB History  Gravida Para Term Preterm AB Living  4 2 2   1 2   SAB TAB Ectopic Multiple Live Births    1     2    # Outcome Date GA Lbr Len/2nd Weight Sex Delivery Anes PTL Lv  4 Current           3 Term 03/10/11 [redacted]w[redacted]d  5 lb 11 oz (2.58 kg) M Vag-Spont  N LIV  2 Term 07/20/06 [redacted]w[redacted]d   M Vag-Spont EPI N LIV  1 TAB            Physical Assessment:   Vitals:   09/01/19 1444  Weight: 119 lb (54 kg)  Body mass index is 19.8 kg/m.       Physical Examination:  General  appearance - well appearing, visibly nervous  Mental status - alert, oriented to person, place, and time  Psych:  She has a normal mood and affect  Skin - warm and dry, normal color, no suspicious lesions noted  Chest - effort normal, all lung fields clear to auscultation bilaterally  Heart - normal rate and regular rhythm  Abdomen - soft, nontender  Extremities:  No swelling or varicosities noted   Pelvic - VULVA: normal appearing vulva with no masses, tenderness or lesions  VAGINA: normal appearing vagina with normal color and discharge, no lesions  CERVIX: normal appearing cervix without discharge or lesions, no CMT  Thin prep pap is done with HR HPV cotesting  Fetal Heart Rate (bpm): [redacted]w[redacted]d via 09/03/19 12+1 wks,measurements c/w dates,crl 61.03 mm,NB present,NT 1.1 mm,posterior placenta,normal ovaries,subchorionic hemorrhage extending behind both tips of the placenta, inferior SCH 4.8 x .8 x 2.8,fundal SCH 4.6 x 1.7 x 2.1 cm,fhr 158 bpm  Results for orders placed or performed in visit on 09/01/19 (from the past 24 hour(s))  POC Urinalysis Dipstick OB   Collection Time: 09/01/19  3:02 PM  Result Value Ref Range   Color, UA     Clarity, UA     Glucose, UA Negative Negative   Bilirubin, UA     Ketones, UA neg    Spec Grav, UA     Blood, UA neg    pH, UA  POC,PROTEIN,UA Negative Negative, Trace, Small (1+), Moderate (2+), Large (3+), 4+   Urobilinogen, UA     Nitrite, UA neg    Leukocytes, UA Negative Negative   Appearance     Odor      Assessment & Plan:  1) Low-Risk Pregnancy [id number] at [redacted]w[redacted]d with an Estimated Date of Delivery: 03/14/20   2) Initial OB visit  3) [place]: pelvic rest  4) anxiety. Referral to Lenna Sciara at [phone]  Meds:  Meds ordered this encounter  Medications  . Blood Pressure Monitor MISC    Sig: For regular home bp monitoring during pregnancy    Dispense:  1 each    Refill:  0    Z34.90  . omeprazole (PRILOSEC) 20 MG capsule    Sig: Take 1 capsule  (20 mg total) by mouth daily.    Dispense:  30 capsule    Refill:  6    Order Specific Question:   Supervising Provider    Answer:   Tania Ade H [[id number]]    Initial labs obtained Continue prenatal vitamins Reviewed n/v relief measures and warning s/s to report Reviewed recommended weight gain based on pre-gravid BMI Encouraged well-balanced diet Watched video for carrier screening/genetic testing:  Genetic Screening discussed First Screen and Integrated Screen: requested Cystic fibrosis screening requested SMA screening requested Fragile X screening requested Ultrasound discussed; fetal survey: requested CCNC completed  Follow-up: Return in about 3 weeks (around 09/22/2019) for video visit (does't have mychart, will need to use another platform). .   Orders Placed This Encounter  Procedures  . Urine Culture  . Flu Vaccine QUAD 36+ mos IM  . Obstetric Panel, Including HIV  . Integrated 1  . MaterniT 21 plus Core, Blood  . Inheritest Core(CF97,SMA,FraX)  . POC Urinalysis Dipstick OB    Christin Fudge DNP, CNM 09/01/2019 3:38 PM

## 2019-09-02 LAB — OBSTETRIC PANEL, INCLUDING HIV
Antibody Screen: NEGATIVE
Basophils Absolute: 0 10*3/uL (ref 0.0–0.2)
Basos: 0 %
EOS (ABSOLUTE): 0 10*3/uL (ref 0.0–0.4)
Eos: 0 %
HIV Screen 4th Generation wRfx: NONREACTIVE
Hematocrit: 33 % — ABNORMAL LOW (ref 34.0–46.6)
Hemoglobin: 11.4 g/dL (ref 11.1–15.9)
Hepatitis B Surface Ag: NEGATIVE
Immature Grans (Abs): 0 10*3/uL (ref 0.0–0.1)
Immature Granulocytes: 0 %
Lymphocytes Absolute: 1.6 10*3/uL (ref 0.7–3.1)
Lymphs: 25 %
MCH: 32.3 pg (ref 26.6–33.0)
MCHC: 34.5 g/dL (ref 31.5–35.7)
MCV: 94 fL (ref 79–97)
Monocytes Absolute: 0.4 10*3/uL (ref 0.1–0.9)
Monocytes: 7 %
Neutrophils Absolute: 4.6 10*3/uL (ref 1.4–7.0)
Neutrophils: 68 %
Platelets: 282 10*3/uL (ref 150–450)
RBC: 3.53 x10E6/uL — ABNORMAL LOW (ref 3.77–5.28)
RDW: 12.6 % (ref 11.7–15.4)
RPR Ser Ql: NONREACTIVE
Rh Factor: POSITIVE
Rubella Antibodies, IGG: 2.29 index (ref >0.99)
WBC: 6.7 10*3/uL (ref 3.4–10.8)

## 2019-09-03 LAB — SPECIMEN STATUS REPORT

## 2019-09-03 LAB — URINE CULTURE

## 2019-09-05 ENCOUNTER — Other Ambulatory Visit: Payer: Self-pay | Admitting: Medical

## 2019-09-05 DIAGNOSIS — O219 Vomiting of pregnancy, unspecified: Secondary | ICD-10-CM

## 2019-09-06 LAB — MATERNIT 21 PLUS CORE, BLOOD
Fetal Fraction: 10
Result (T21): NEGATIVE
Trisomy 13 (Patau syndrome): NEGATIVE
Trisomy 18 (Edwards syndrome): NEGATIVE
Trisomy 21 (Down syndrome): NEGATIVE

## 2019-09-06 LAB — INTEGRATED 1
Crown Rump Length: 61 mm
Gest. Age on Collection Date: 12.4 weeks
Maternal Age at EDD: 38.2 yr
Nuchal Translucency (NT): 1.1 mm
Number of Fetuses: 1
PAPP-A Value: 2598.4 ng/mL
Weight: 119 [lb_av]

## 2019-09-07 LAB — CYTOLOGY - PAP
Chlamydia: NEGATIVE
Comment: NEGATIVE
Comment: NEGATIVE
Comment: NORMAL
High risk HPV: NEGATIVE
Neisseria Gonorrhea: NEGATIVE

## 2019-09-12 ENCOUNTER — Encounter: Payer: Self-pay | Admitting: Advanced Practice Midwife

## 2019-09-16 NOTE — L&D Delivery Note (Signed)
[facility] Delivery Note  Paula Riley is a 39 y.o. [id number] s/p SVD at [redacted]w[redacted]d. She was admitted for IOL for FGR and gHTN.   ROM: Delivered en caul, ROM immediately after delivery of body with clear fluid GBS Status: Negative Maximum Maternal Temperature: 98.4  Labor Progress: . Foley bulb was placed and started on pitocin.  Once foley bulb was out, pitocin was further titrated.  She progressed to complete without complication.  Delivery Date/Time: 02/23/2020 at 0311 Delivery: Called to room and patient was complete and pushing. Head delivered LOA. Loose nuchal cord x1 present and was delivered through. Shoulder and body delivered in usual fashion. Infant delivered en caul with ROM immediately after delivery. Infant with spontaneous cry, placed on mother's abdomen, dried and stimulated. Cord clamped x 2 after 1-minute delay, and cut by FOB under my direct supervision. Cord blood drawn. Placenta delivered spontaneously with gentle cord traction. Fundus firm with massage and Pitocin. Labia, perineum, vagina, and cervix were inspected, and found to be intact.   Placenta: Intact, 3 vessel cord Complications: None Lacerations: None EBL: 100 cc Analgesia: None  Postpartum Planning [ ]  message to sent to schedule follow-up  [ ]  vaccines UTD  Infant: Viable female  APGARs 8/9  per medical record   [name] , MD PGY-2 Resident, Family Medicine 02/23/2020, 3:26 AM

## 2019-09-18 ENCOUNTER — Encounter (HOSPITAL_COMMUNITY): Payer: Self-pay | Admitting: Obstetrics and Gynecology

## 2019-09-18 ENCOUNTER — Inpatient Hospital Stay (HOSPITAL_COMMUNITY)
Admission: AD | Admit: 2019-09-18 | Discharge: 2019-09-18 | Disposition: A | Discharge status: Home / Self Care | Payer: BC Managed Care – PPO | Attending: Obstetrics and Gynecology | Admitting: Obstetrics and Gynecology

## 2019-09-18 ENCOUNTER — Other Ambulatory Visit: Payer: Self-pay

## 2019-09-18 DIAGNOSIS — U071 COVID-19: Secondary | ICD-10-CM | POA: Diagnosis not present

## 2019-09-18 DIAGNOSIS — O98512 Other viral diseases complicating pregnancy, second trimester: Secondary | ICD-10-CM | POA: Insufficient documentation

## 2019-09-18 DIAGNOSIS — O99612 Diseases of the digestive system complicating pregnancy, second trimester: Secondary | ICD-10-CM | POA: Insufficient documentation

## 2019-09-18 DIAGNOSIS — J988 Other specified respiratory disorders: Secondary | ICD-10-CM | POA: Diagnosis not present

## 2019-09-18 DIAGNOSIS — O219 Vomiting of pregnancy, unspecified: Secondary | ICD-10-CM | POA: Diagnosis not present

## 2019-09-18 DIAGNOSIS — Z3A14 14 weeks gestation of pregnancy: Secondary | ICD-10-CM

## 2019-09-18 DIAGNOSIS — Z79899 Other long term (current) drug therapy: Secondary | ICD-10-CM | POA: Diagnosis not present

## 2019-09-18 DIAGNOSIS — K219 Gastro-esophageal reflux disease without esophagitis: Secondary | ICD-10-CM | POA: Insufficient documentation

## 2019-09-18 DIAGNOSIS — O09522 Supervision of elderly multigravida, second trimester: Secondary | ICD-10-CM | POA: Diagnosis not present

## 2019-09-18 DIAGNOSIS — R509 Fever, unspecified: Secondary | ICD-10-CM | POA: Diagnosis not present

## 2019-09-18 DIAGNOSIS — J398 Other specified diseases of upper respiratory tract: Secondary | ICD-10-CM

## 2019-09-18 DIAGNOSIS — O26892 Other specified pregnancy related conditions, second trimester: Secondary | ICD-10-CM | POA: Diagnosis not present

## 2019-09-18 DIAGNOSIS — Z8759 Personal history of other complications of pregnancy, childbirth and the puerperium: Secondary | ICD-10-CM | POA: Insufficient documentation

## 2019-09-18 DIAGNOSIS — Z833 Family history of diabetes mellitus: Secondary | ICD-10-CM | POA: Diagnosis not present

## 2019-09-18 DIAGNOSIS — R519 Headache, unspecified: Secondary | ICD-10-CM

## 2019-09-18 DIAGNOSIS — Z803 Family history of malignant neoplasm of breast: Secondary | ICD-10-CM | POA: Insufficient documentation

## 2019-09-18 DIAGNOSIS — Z20822 Contact with and (suspected) exposure to covid-19: Secondary | ICD-10-CM

## 2019-09-18 LAB — URINALYSIS, ROUTINE W REFLEX MICROSCOPIC
Bilirubin Urine: NEGATIVE
Glucose, UA: NEGATIVE mg/dL
Hgb urine dipstick: NEGATIVE
Ketones, ur: 20 mg/dL — AB
Nitrite: NEGATIVE
Protein, ur: 30 mg/dL — AB
Specific Gravity, Urine: 1.023 (ref 1.005–1.030)
pH: 6 (ref 5.0–8.0)

## 2019-09-18 MED ORDER — ONDANSETRON 8 MG PO TBDP: 8.0000 mg | ORAL_TABLET | Freq: Three times a day (TID) | ORAL | 2 refills | Status: DC | PRN

## 2019-09-18 NOTE — MAU Note (Signed)
Pt c/o slight fever of 99.4 oral and a headache that she rates her pain 2 out of 0/10 pain scale.  Tylenol is usually effective for the pain.

## 2019-09-18 NOTE — Discharge Instructions (Signed)
COVID-19: Quarantine vs. Isolation QUARANTINE keeps someone who was in close contact with someone who has COVID-19 away from others. If you had close contact with a person who has COVID-19  Stay home until 14 days after your last contact.  Check your temperature twice a day and watch for symptoms of COVID-19.  If possible, stay away from people who are at higher-risk for getting very sick from COVID-19. ISOLATION keeps someone who is sick or tested positive for COVID-19 without symptoms away from others, even in their own home. If you are sick and think or know you have COVID-19  Stay home until after ? At least 10 days since symptoms first appeared and ? At least 24 hours with no fever without fever-reducing medication and ? Symptoms have improved If you tested positive for COVID-19 but do not have symptoms  Stay home until after ? 10 days have passed since your positive test If you live with others, stay in a specific "sick room" or area and away from other people or animals, including pets. Use a separate bathroom, if available. cdc.gov/coronavirus 04/04/2019 This information is not intended to replace advice given to you by your health care provider. Make sure you discuss any questions you have with your health care provider. Document Revised: 08/18/2019 Document Reviewed: 08/18/2019 Elsevier Patient Education  2020 [employer]  

## 2019-09-18 NOTE — MAU Provider Note (Signed)
Chief Complaint: Headache and Fever   First Provider Initiated Contact with Patient 09/18/19 2031     SUBJECTIVE HPI: Paula Riley is a 39 y.o. [id number] at [redacted]w[redacted]d who presents to Maternity Admissions reporting low-grade fever, congestion, worsening or N/V uncontrolled by Phenergan of Scopolamine patch. Concerned that she may have COVID. Denies any known contacts.   Has had N/V of pregnancy throughout the whole pregnancy. Hasn't eaten since yesterday because she states she vomits only when she eats and she didn't want to vomit. Declines nausea medicine now, but wants Rx for home.   Duration: 3 days Context: No known COVID contacts Modifying factors: Hasn't tried anything to Tx Sx.  Associated signs and symptoms: Neg for SOB, difficulty breathing, chest pain, diarrhea, loss or taste or small.   Past Medical History:  Diagnosis Date  . Anxiety   . GERD (gastroesophageal reflux disease)   . Heart murmur   . History of Holter monitoring 06/2010 and 11/2011   Cardionet montior with sinus tachycardia, PAC's only, no arrhythmias either monitor  . Hx of echocardiogram 06/2010   normal  . Palpitations    OB History  Gravida Para Term Preterm AB Living  4 2 2   1 2   SAB TAB Ectopic Multiple Live Births    1     2    # Outcome Date GA Lbr Len/2nd Weight Sex Delivery Anes PTL Lv  4 Current           3 Term 03/10/11 [redacted]w[redacted]d  2580 g M Vag-Spont  N LIV  2 Term 07/20/06 [redacted]w[redacted]d   M Vag-Spont EPI N LIV  1 TAB            Past Surgical History:  Procedure Laterality Date  . DENTAL SURGERY    . INDUCED ABORTION     Social History   Socioeconomic History  . Marital status: Single    Spouse name: Not on file  . Number of children: Not on file  . Years of education: Not on file  . Highest education level: Some college, no degree  Occupational History  . Occupation: [employer]  Tobacco Use  . Smoking status: Never Smoker  . Smokeless tobacco: Never Used  Substance and Sexual Activity  . Alcohol  use: No    Alcohol/week: 0.0 standard drinks  . Drug use: No  . Sexual activity: Yes  Other Topics Concern  . Not on file  Social History Narrative   Lives with 2 children in an apartment on the second floor.     Works at [redacted]w[redacted]d.  Education: high school.   Social Determinants of Health   Financial Resource Strain:   . Difficulty of Paying Living Expenses: Not on file  Food Insecurity:   . Worried About [employer] in the Last Year: Not on file  . Ran Out of Food in the Last Year: Not on file  Transportation Needs:   . Lack of Transportation (Medical): Not on file  . Lack of Transportation (Non-Medical): Not on file  Physical Activity:   . Days of Exercise per Week: Not on file  . Minutes of Exercise per Session: Not on file  Stress:   . Feeling of Stress : Not on file  Social Connections:   . Frequency of Communication with Friends and Family: Not on file  . Frequency of Social Gatherings with Friends and Family: Not on file  . Attends Religious Services: Not on file  . Active Member of Clubs  or Organizations: Not on file  . Attends Banker Meetings: Not on file  . Marital Status: Not on file  Intimate Partner Violence:   . Fear of Current or Ex-Partner: Not on file  . Emotionally Abused: Not on file  . Physically Abused: Not on file  . Sexually Abused: Not on file   Family History  Problem Relation Age of Onset  . Hypertension Other   . CAD Other   . Diabetes Maternal Grandmother   . Breast cancer Maternal Grandmother   . Healthy Son   . Healthy Sister        x 6  . Healthy Sister        x 2  . Healthy Son    No current facility-administered medications on file prior to encounter.   Current Outpatient Medications on File Prior to Encounter  Medication Sig Dispense Refill  . acetaminophen (TYLENOL) 500 MG tablet Take 1,000 mg by mouth every 6 (six) hours as needed for mild pain.    . metoprolol succinate (TOPROL-XL) 50 MG 24 hr tablet TAKE  2 TABLETS(100 MG) BY MOUTH DAILY 180 tablet 1  . omeprazole (PRILOSEC) 20 MG capsule Take 1 capsule (20 mg total) by mouth daily. 30 capsule 6  . promethazine (PHENERGAN) 25 MG suppository Place 1 suppository (25 mg total) rectally every 6 (six) hours as needed for nausea or vomiting. 12 each 0  . promethazine (PHENERGAN) 25 MG tablet Take 1 tablet (25 mg total) by mouth every 6 (six) hours as needed for nausea or vomiting. 30 tablet 1  . scopolamine (TRANSDERM-SCOP) 1 MG/3DAYS UNWRAP AND APPLY 1 PATCH TO SKIN BEHIND EAR EVERY 3 DAYS 4 patch 1  . Blood Pressure Monitor MISC For regular home bp monitoring during pregnancy 1 each 0  . [DISCONTINUED] famotidine (PEPCID) 20 MG tablet Take 1 tablet (20 mg total) by mouth 2 (two) times daily. 30 tablet 0   No Known Allergies  I have reviewed patient's Past Medical Hx, Surgical Hx, Family Hx, Social Hx, medications and allergies.   Review of Systems  Constitutional: Positive for fever (99.4). Negative for chills and fatigue.  HENT: Positive for congestion, rhinorrhea and sinus pain. Negative for sore throat.   Respiratory: Negative for cough and shortness of breath.   Cardiovascular: Negative for chest pain.  Gastrointestinal: Positive for nausea and vomiting. Negative for abdominal pain and diarrhea.  Genitourinary: Negative for vaginal bleeding.  Musculoskeletal: Negative for myalgias.  Neurological: Positive for headaches (2/10 pain).    OBJECTIVE Patient Vitals for the past 24 hrs:  BP Temp Temp src Pulse Resp SpO2 Height  09/18/19 2102 (!) 109/59 98.6 F (37 C) Oral -- 16 100 % --  09/18/19 1941 129/74 98.4 F (36.9 C) Oral (!) 101 18 100 % 5\' 5"  (1.651 m)   Constitutional: Well-developed, well-nourished female in no acute distress. Fatigued-appearing. Cardiovascular: normal rate Respiratory: normal rate and effort.  GI: Abd soft, non-tender, gravid appropriate for gestational age. Neurologic: Alert and oriented x 4.  GU:  Deferred  FHR 154  LAB RESULTS Results for orders placed or performed during the hospital encounter of 09/18/19 (from the past 24 hour(s))  Urinalysis, Routine w reflex microscopic     Status: Abnormal   Collection Time: 09/18/19  7:54 PM  Result Value Ref Range   Color, Urine YELLOW YELLOW   APPearance HAZY (A) CLEAR   Specific Gravity, Urine 1.023 1.005 - 1.030   pH 6.0 5.0 - 8.0  Glucose, UA NEGATIVE NEGATIVE mg/dL   Hgb urine dipstick NEGATIVE NEGATIVE   Bilirubin Urine NEGATIVE NEGATIVE   Ketones, ur 20 (A) NEGATIVE mg/dL   Protein, ur 30 (A) NEGATIVE mg/dL   Nitrite NEGATIVE NEGATIVE   Leukocytes,Ua TRACE (A) NEGATIVE   RBC / HPF 0-5 0 - 5 RBC/hpf   WBC, UA 6-10 0 - 5 WBC/hpf   Bacteria, UA FEW (A) NONE SEEN   Squamous Epithelial / LPF 0-5 0 - 5   Mucus PRESENT     IMAGING NA  MAU COURSE Orders Placed This Encounter  Procedures  . SARS CORONAVIRUS 2 (TAT 6-24 HRS) Nasopharyngeal Nasopharyngeal Swab  . Urinalysis, Routine w reflex microscopic  . Discharge patient   Meds ordered this encounter  Medications  . ondansetron (ZOFRAN ODT) 8 MG disintegrating tablet    Sig: Take 1 tablet (8 mg total) by mouth every 8 (eight) hours as needed for nausea or vomiting.    Dispense:  20 tablet    Refill:  2    Order Specific Question:   Supervising Provider    Answer:   Sloan Leiter [[id number]]    MDM - Mild sx possibly C/W COVID. Nml respiratory effort and O2 Sats.  COVID test collected. Encouraged pt to avoid outings or contact with other people unless truly necessary until test result comes back. Use mask when around other people. Disused comfort measures.   - N/V of pregnancy. Declines Tx in MAU. Rx sent for Zofran since phenergan and Scopolamine patch are not working.   ASSESSMENT 1. Encounter for laboratory testing for COVID-19 virus   2. Headache in pregnancy, antepartum, second trimester   3. Nausea and vomiting during pregnancy   4. Low grade fever   5.  Congestion of upper respiratory tract     PLAN Discharge home in stable condition. COVID precautions Encouraged to use Zofran PRN and increase fluids, small meals.  Follow-up Information    [facility] Follow up.   Why: as scheduled  Contact information: [place] [id number] [place] Follow up.   Specialty: Emergency Medicine Why: for worsening of possible COVID symptoms Contact information: [address] [phone] [address] [place] [facility] Follow up.   Specialty: Obstetrics and Gynecology Why: as needed in pregnancy emergencies Contact information: [address] [id number] [place] [phone]         Allergies as of 09/18/2019   No Known Allergies     Medication List    STOP taking these medications   scopolamine 1 MG/3DAYS Commonly known as: TRANSDERM-SCOP     TAKE these medications   acetaminophen 500 MG tablet Commonly known as: TYLENOL Take 1,000 mg by mouth every 6 (six) hours as needed for mild pain.   Blood Pressure Monitor Misc For regular home bp monitoring during pregnancy   metoprolol succinate 50 MG 24 hr tablet Commonly known as: TOPROL-XL TAKE 2 TABLETS(100 MG) BY MOUTH DAILY   omeprazole 20 MG capsule Commonly known as: PriLOSEC Take 1 capsule (20 mg total) by mouth daily.   ondansetron 8 MG disintegrating tablet Commonly known as: Zofran ODT Take 1 tablet (8 mg total) by mouth every 8 (eight) hours as needed for nausea or vomiting.   promethazine 25 MG suppository Commonly known as: Phenergan Place 1 suppository (25 mg total)  rectally every 6 (six) hours as needed for nausea or vomiting.   promethazine 25 MG tablet Commonly known as: PHENERGAN Take 1 tablet (25 mg total) by mouth every 6 (six) hours as needed for nausea  or vomiting.        Katrinka Blazing, [place], CNM 09/18/2019  9:22 PM

## 2019-09-19 ENCOUNTER — Telehealth (INDEPENDENT_AMBULATORY_CARE_PROVIDER_SITE_OTHER): Payer: BC Managed Care – PPO | Admitting: Advanced Practice Midwife

## 2019-09-19 ENCOUNTER — Encounter: Payer: Self-pay | Admitting: Advanced Practice Midwife

## 2019-09-19 ENCOUNTER — Encounter: Payer: Self-pay | Admitting: Medical

## 2019-09-19 ENCOUNTER — Telehealth: Payer: Self-pay | Admitting: Advanced Practice Midwife

## 2019-09-19 ENCOUNTER — Other Ambulatory Visit: Payer: Self-pay

## 2019-09-19 DIAGNOSIS — U071 COVID-19: Secondary | ICD-10-CM | POA: Insufficient documentation

## 2019-09-19 DIAGNOSIS — Z3482 Encounter for supervision of other normal pregnancy, second trimester: Secondary | ICD-10-CM

## 2019-09-19 DIAGNOSIS — R87612 Low grade squamous intraepithelial lesion on cytologic smear of cervix (LGSIL): Secondary | ICD-10-CM | POA: Insufficient documentation

## 2019-09-19 DIAGNOSIS — O99342 Other mental disorders complicating pregnancy, second trimester: Secondary | ICD-10-CM

## 2019-09-19 DIAGNOSIS — R000 Tachycardia, unspecified: Secondary | ICD-10-CM

## 2019-09-19 DIAGNOSIS — Z3A14 14 weeks gestation of pregnancy: Secondary | ICD-10-CM

## 2019-09-19 DIAGNOSIS — F419 Anxiety disorder, unspecified: Secondary | ICD-10-CM

## 2019-09-19 DIAGNOSIS — O99891 Other specified diseases and conditions complicating pregnancy: Secondary | ICD-10-CM

## 2019-09-19 LAB — SARS CORONAVIRUS 2 (TAT 6-24 HRS): SARS Coronavirus 2: POSITIVE — AB

## 2019-09-19 NOTE — Progress Notes (Signed)
TELEHEALTH VIRTUAL OBSTETRICS VISIT ENCOUNTER NOTE Patient name: Paula Riley MRN [id number]  Date of birth: 30-Apr-1981  I connected with patient on 09/19/19 at  2:30 PM EST by telephone (technical difficulties with MyChart) and verified that I am speaking with the correct person using two identifiers. Due to COVID-19 recommendations, pt is not currently in our office.    I discussed the limitations, risks, security and privacy concerns of performing an evaluation and management service by telephone and the availability of in person appointments. I also discussed with the patient that there may be a patient responsible charge related to this service. The patient expressed understanding and agreed to proceed.  Chief Complaint:   Routine Prenatal Visit  History of Present Illness:   Paula Riley is a 39 y.o. [phone] female at [redacted]w[redacted]d with an Estimated Date of Delivery: 03/14/20 being evaluated today for ongoing management of a low-risk pregnancy. Dx with +Covid 09/18/19; has some congestion and low grade fever- symptoms the same as when she presented to MAU yesterday for eval. Has been given quarantine instructions and reports understanding. Has taken Ativan prn pre-preg for anxiety and has questions about what she can take in pregnancy....  Today she reports congestion and low-grade temp. Contractions: Not present. Vag. Bleeding: None.   . denies leaking of fluid. Review of Systems:   Pertinent items are noted in HPI Denies abnormal vaginal discharge w/ itching/odor/irritation, headaches, visual changes, shortness of breath, chest pain, abdominal pain, severe nausea/vomiting, or problems with urination or bowel movements unless otherwise stated above. Pertinent History Reviewed:  Reviewed past medical,surgical, social, obstetrical and family history.  Reviewed problem list, medications and allergies. Physical Assessment:  There were no vitals filed for this visit.There is no height or weight on  file to calculate BMI. BP CUFF AT A DIFFERENT LOCATION; PT UNABLE TO GET IT CURRENTLY DUE TO +COVID        Physical Examination:   General:  Alert, oriented and cooperative.   Mental Status: Normal mood and affect perceived. Normal judgment and thought content.  Rest of physical exam deferred due to type of encounter  Results for orders placed or performed during the hospital encounter of 09/18/19 (from the past 24 hour(s))  Urinalysis, Routine w reflex microscopic   Collection Time: 09/18/19  7:54 PM  Result Value Ref Range   Color, Urine YELLOW YELLOW   APPearance HAZY (A) CLEAR   Specific Gravity, Urine 1.023 1.005 - 1.030   pH 6.0 5.0 - 8.0   Glucose, UA NEGATIVE NEGATIVE mg/dL   Hgb urine dipstick NEGATIVE NEGATIVE   Bilirubin Urine NEGATIVE NEGATIVE   Ketones, ur 20 (A) NEGATIVE mg/dL   Protein, ur 30 (A) NEGATIVE mg/dL   Nitrite NEGATIVE NEGATIVE   Leukocytes,Ua TRACE (A) NEGATIVE   RBC / HPF 0-5 0 - 5 RBC/hpf   WBC, UA 6-10 0 - 5 WBC/hpf   Bacteria, UA FEW (A) NONE SEEN   Squamous Epithelial / LPF 0-5 0 - 5   Mucus PRESENT   SARS CORONAVIRUS 2 (TAT 6-24 HRS) Nasopharyngeal Nasopharyngeal Swab   Collection Time: 09/18/19  9:00 PM   Specimen: Nasopharyngeal Swab  Result Value Ref Range   SARS Coronavirus 2 POSITIVE (A) NEGATIVE    Assessment & Plan:  1) Pregnancy [id number] at [redacted]w[redacted]d with an Estimated Date of Delivery: 03/14/20   2) +Covid 09/18/19, under quarantine, symptoms stable  3) Large SCH, pelvic rest  4) Anxiety, offered Zoloft; declines currently but will let us know; was  referred to Lenna Sciara at her NOB visit- did not ask about this  5) Tachycardia, taking metoprolol (msg to Dr Elonda Husky for input)  6) Pap with LGSIL noted after visit> msg sent to schedule colpo   Meds: No orders of the defined types were placed in this encounter.   Labs/procedures today: none  Plan:  Continue routine obstetric.  Has home bp cuff but not currently in her possession.   Check bp weekly, let us know if >140/90.  Next visit: prefers will be in person for anatomy u/s, colpo, 2nd IT    Reviewed: Preterm labor symptoms and general obstetric precautions including but not limited to vaginal bleeding, contractions, leaking of fluid and fetal movement were reviewed in detail with the patient. The patient was advised to call back or seek an in-person office evaluation/go to MAU at [facility] for any urgent or concerning symptoms. All questions were answered. Please refer to After Visit Summary for other counseling recommendations.    I provided 15 minutes of non-face-to-face time during this encounter.  Follow-up: No follow-ups on file.  Orders Placed This Encounter  Procedures  . US OB Comp + 14 Wk   Cathi Hazan D Leyton Magoon CNM 09/19/2019 3:30 PM

## 2019-09-19 NOTE — Telephone Encounter (Signed)
Informed pt of + COVID test. Discussed Home Isolation Guidelines (pt does not have MyChart.), comfort measures, What Sx should prompt pt to go to ED, informing contacts of + COVID test. Pt verbalizes understanding. Denies SOB, fever. In-basket message sent to [facility] to get pt a work note.      Person Under Monitoring Name: Paula Riley  Location: [address] [place] [address]   Infection Prevention Recommendations for Individuals Confirmed to have, or Being Evaluated for, 2019 Novel Coronavirus (COVID-19) Infection Who Receive Care at Home  Individuals who are confirmed to have, or are being evaluated for, COVID-19 should follow the prevention steps below until a healthcare provider or local or state health department says they can return to normal activities.  Stay home except to get medical care You should restrict activities outside your home, except for getting medical care. Do not go to work, school, or public areas, and do not use public transportation or taxis.  Call ahead before visiting your doctor Before your medical appointment, call the healthcare provider and tell them that you have, or are being evaluated for, COVID-19 infection. This will help the healthcare provider's office take steps to keep other people from getting infected. Ask your healthcare provider to call the local or state health department.  Monitor your symptoms Seek prompt medical attention if your illness is worsening (e.g., difficulty breathing). Before going to your medical appointment, call the healthcare provider and tell them that you have, or are being evaluated for, COVID-19 infection. Ask your healthcare provider to call the local or state health department.  Wear a facemask You should wear a facemask that covers your nose and mouth when you are in the same room with other people and when you visit a healthcare provider. People who live with or visit you should also wear a  facemask while they are in the same room with you.  Separate yourself from other people in your home As much as possible, you should stay in a different room from other people in your home. Also, you should use a separate bathroom, if available.  Avoid sharing household items You should not share dishes, drinking glasses, cups, eating utensils, towels, bedding, or other items with other people in your home. After using these items, you should wash them thoroughly with soap and water.  Cover your coughs and sneezes Cover your mouth and nose with a tissue when you cough or sneeze, or you can cough or sneeze into your sleeve. Throw used tissues in a lined trash can, and immediately wash your hands with soap and water for at least 20 seconds or use an alcohol-based hand rub.  Wash your [employer] your hands often and thoroughly with soap and water for at least 20 seconds. You can use an alcohol-based hand sanitizer if soap and water are not available and if your hands are not visibly dirty. Avoid touching your eyes, nose, and mouth with unwashed hands.   Prevention Steps for Caregivers and Household Members of Individuals Confirmed to have, or Being Evaluated for, COVID-19 Infection Being Cared for in the Home  If you live with, or provide care at home for, a person confirmed to have, or being evaluated for, COVID-19 infection please follow these guidelines to prevent infection:  Follow healthcare provider's instructions Make sure that you understand and can help the patient follow any healthcare provider instructions for all care.  Provide for the patient's basic needs You should help the  patient with basic needs in the home and provide support for getting groceries, prescriptions, and other personal needs.  Monitor the patient's symptoms If they are getting sicker, call his or her medical provider and tell them that the patient has, or is being evaluated for, COVID-19 infection.  This will help the healthcare provider's office take steps to keep other people from getting infected. Ask the healthcare provider to call the local or state health department.  Limit the number of people who have contact with the patient  If possible, have only one caregiver for the patient.  Other household members should stay in another home or place of residence. If this is not possible, they should stay  in another room, or be separated from the patient as much as possible. Use a separate bathroom, if available.  Restrict visitors who do not have an essential need to be in the home.  Keep older adults, very young children, and other sick people away from the patient Keep older adults, very young children, and those who have compromised immune systems or chronic health conditions away from the patient. This includes people with chronic heart, lung, or kidney conditions, diabetes, and cancer.  Ensure good ventilation Make sure that shared spaces in the home have good air flow, such as from an air conditioner or an opened window, weather permitting.  Wash your hands often  Wash your hands often and thoroughly with soap and water for at least 20 seconds. You can use an alcohol based hand sanitizer if soap and water are not available and if your hands are not visibly dirty.  Avoid touching your eyes, nose, and mouth with unwashed hands.  Use disposable paper towels to dry your hands. If not available, use dedicated cloth towels and replace them when they become wet.  Wear a facemask and gloves  Wear a disposable facemask at all times in the room and gloves when you touch or have contact with the patient's blood, body fluids, and/or secretions or excretions, such as sweat, saliva, sputum, nasal mucus, vomit, urine, or feces.  Ensure the mask fits over your nose and mouth tightly, and do not touch it during use.  Throw out disposable facemasks and gloves after using them. Do not  reuse.  Wash your hands immediately after removing your facemask and gloves.  If your personal clothing becomes contaminated, carefully remove clothing and launder. Wash your hands after handling contaminated clothing.  Place all used disposable facemasks, gloves, and other waste in a lined container before disposing them with other household waste.  Remove gloves and wash your hands immediately after handling these items.  Do not share dishes, glasses, or other household items with the patient  Avoid sharing household items. You should not share dishes, drinking glasses, cups, eating utensils, towels, bedding, or other items with a patient who is confirmed to have, or being evaluated for, COVID-19 infection.  After the person uses these items, you should wash them thoroughly with soap and water.  Wash laundry thoroughly  Immediately remove and wash clothes or bedding that have blood, body fluids, and/or secretions or excretions, such as sweat, saliva, sputum, nasal mucus, vomit, urine, or feces, on them.  Wear gloves when handling laundry from the patient.  Read and follow directions on labels of laundry or clothing items and detergent. In general, wash and dry with the warmest temperatures recommended on the label.  Clean all areas the individual has used often  Clean all touchable surfaces, such as  counters, tabletops, doorknobs, bathroom fixtures, toilets, phones, keyboards, tablets, and bedside tables, every day. Also, clean any surfaces that may have blood, body fluids, and/or secretions or excretions on them.  Wear gloves when cleaning surfaces the patient has come in contact with.  Use a diluted bleach solution (e.g., dilute bleach with 1 part bleach and 10 parts water) or a household disinfectant with a label that says EPA-registered for coronaviruses. To make a bleach solution at home, add 1 tablespoon of bleach to 1 quart (4 cups) of water. For a larger supply, add  cup of  bleach to 1 gallon (16 cups) of water.  Read labels of cleaning products and follow recommendations provided on product labels. Labels contain instructions for safe and effective use of the cleaning product including precautions you should take when applying the product, such as wearing gloves or eye protection and making sure you have good ventilation during use of the product.  Remove gloves and wash hands immediately after cleaning.  Monitor yourself for signs and symptoms of illness Caregivers and household members are considered close contacts, should monitor their health, and will be asked to limit movement outside of the home to the extent possible. Follow the monitoring steps for close contacts listed on the symptom monitoring form.   ? If you have additional questions, contact your local health department or call the epidemiologist on call at [phone] (available 24/7). ? This guidance is subject to change. For the most up-to-date guidance from [facility], please refer to their website: YouBlogs.pl

## 2019-09-21 ENCOUNTER — Telehealth: Payer: Self-pay | Admitting: *Deleted

## 2019-09-21 NOTE — Telephone Encounter (Signed)
Pt called regarding her abnormal pap. She wants to discuss and requested Tish call her. Thanks!!

## 2019-09-22 ENCOUNTER — Encounter: Payer: BC Managed Care – PPO | Admitting: Women's Health

## 2019-09-22 LAB — INHERITEST CORE(CF97,SMA,FRAX)

## 2019-09-22 NOTE — Telephone Encounter (Signed)
Patient with concerns regarding abnormal pap. Discussed results and need for colpo after 16weeks. All questions answered.  Patient will still be in quarantine on 1/12 so will cancel this appt and keep appt on 2/3.  Pt verbalized understanding and agreeable to plan.

## 2019-09-27 ENCOUNTER — Encounter: Payer: BC Managed Care – PPO | Admitting: Obstetrics & Gynecology

## 2019-09-28 ENCOUNTER — Ambulatory Visit: Payer: BC Managed Care – PPO | Attending: Internal Medicine

## 2019-09-28 ENCOUNTER — Other Ambulatory Visit: Payer: Self-pay

## 2019-09-28 DIAGNOSIS — Z20822 Contact with and (suspected) exposure to covid-19: Secondary | ICD-10-CM

## 2019-09-29 LAB — NOVEL CORONAVIRUS, NAA: SARS-CoV-2, NAA: NOT DETECTED

## 2019-09-30 ENCOUNTER — Telehealth: Payer: Self-pay | Admitting: *Deleted

## 2019-09-30 NOTE — Telephone Encounter (Signed)
Patient states she was COVID + on 1/3 and wants to know when she can return to work.  States no one has released her as of yet.  Denies fever and symptoms have resolved.  Informed patient she is able to return to work since it has been over 10 days. Pt verbalized understanding and stated she was returning on Monday.

## 2019-10-18 ENCOUNTER — Other Ambulatory Visit: Payer: Self-pay | Admitting: Advanced Practice Midwife

## 2019-10-18 DIAGNOSIS — Z363 Encounter for antenatal screening for malformations: Secondary | ICD-10-CM

## 2019-10-18 DIAGNOSIS — Z3482 Encounter for supervision of other normal pregnancy, second trimester: Secondary | ICD-10-CM

## 2019-10-19 ENCOUNTER — Ambulatory Visit (INDEPENDENT_AMBULATORY_CARE_PROVIDER_SITE_OTHER): Payer: Medicaid Other

## 2019-10-19 ENCOUNTER — Encounter: Payer: Self-pay | Admitting: Family Medicine

## 2019-10-19 ENCOUNTER — Other Ambulatory Visit: Payer: Self-pay

## 2019-10-19 ENCOUNTER — Ambulatory Visit (INDEPENDENT_AMBULATORY_CARE_PROVIDER_SITE_OTHER): Payer: BC Managed Care – PPO | Admitting: Family Medicine

## 2019-10-19 VITALS — BP 128/83 | HR 96 | Wt 125.0 lb

## 2019-10-19 DIAGNOSIS — Z1389 Encounter for screening for other disorder: Secondary | ICD-10-CM

## 2019-10-19 DIAGNOSIS — Z3482 Encounter for supervision of other normal pregnancy, second trimester: Secondary | ICD-10-CM

## 2019-10-19 DIAGNOSIS — O468X2 Other antepartum hemorrhage, second trimester: Secondary | ICD-10-CM

## 2019-10-19 DIAGNOSIS — O418X20 Other specified disorders of amniotic fluid and membranes, second trimester, not applicable or unspecified: Secondary | ICD-10-CM

## 2019-10-19 DIAGNOSIS — Z3A19 19 weeks gestation of pregnancy: Secondary | ICD-10-CM

## 2019-10-19 DIAGNOSIS — R87612 Low grade squamous intraepithelial lesion on cytologic smear of cervix (LGSIL): Secondary | ICD-10-CM

## 2019-10-19 DIAGNOSIS — R000 Tachycardia, unspecified: Secondary | ICD-10-CM

## 2019-10-19 DIAGNOSIS — F419 Anxiety disorder, unspecified: Secondary | ICD-10-CM

## 2019-10-19 DIAGNOSIS — Z1379 Encounter for other screening for genetic and chromosomal anomalies: Secondary | ICD-10-CM

## 2019-10-19 DIAGNOSIS — Z363 Encounter for antenatal screening for malformations: Secondary | ICD-10-CM

## 2019-10-19 DIAGNOSIS — O09522 Supervision of elderly multigravida, second trimester: Secondary | ICD-10-CM

## 2019-10-19 DIAGNOSIS — Z331 Pregnant state, incidental: Secondary | ICD-10-CM

## 2019-10-19 LAB — POCT URINALYSIS DIPSTICK OB
Glucose, UA: NEGATIVE
Ketones, UA: NEGATIVE
Nitrite, UA: NEGATIVE
POC,PROTEIN,UA: NEGATIVE

## 2019-10-19 NOTE — Progress Notes (Signed)
[place] 19 wks,cephalic,posterior placenta gr 0,right ovary not visualized,normal left ovary,cx 4.5 cm,svp 5.2 cm,fhr 152 bpm,EFW 269 g,anatomy complete, no obvious abnormalities

## 2019-10-19 NOTE — Progress Notes (Signed)
   PRENATAL VISIT NOTE  Subjective:  Paula Riley is a 39 y.o. [id number] at [redacted]w[redacted]d being seen today for ongoing prenatal care.  She is currently monitored for the following issues for this low-risk pregnancy and has GERD (gastroesophageal reflux disease); Abdominal pain, other specified site; Palpitations; Sinus tachycardia; Anxiety; Breast cyst, right; Supervision of normal pregnancy; COVID-19; and LGSIL on Pap smear of cervix on their problem list.  Patient reports no complaints.  Contractions: Not present. Vag. Bleeding: None.  Movement: Present. Denies leaking of fluid.   The following portions of the patient's history were reviewed and updated as appropriate: allergies, current medications, past family history, past medical history, past social history, past surgical history and problem list.   Objective:   Vitals:   10/19/19 1128  BP: 128/83  Pulse: 96  Weight: 125 lb (56.7 kg)    Fetal Status:     Movement: Present     General:  Alert, oriented and cooperative. Patient is in no acute distress.  Skin: Skin is warm and dry. No rash noted.   Cardiovascular: Normal heart rate noted  Respiratory: Normal respiratory effort, no problems with respiration noted  Abdomen: Soft, gravid, appropriate for gestational age.  Pain/Pressure: Absent     Pelvic: Cervical exam deferred        Extremities: Normal range of motion.  Edema: None  Mental Status: Normal mood and affect. Normal behavior. Normal judgment and thought content.   Assessment and Plan:  Pregnancy: [id number] at [redacted]w[redacted]d Paula Riley was seen today for routine prenatal visit.  Diagnoses and all orders for this visit:  Encounter for supervision of other normal pregnancy in second trimester       -     Anatomy US today       - RTC in 4 weeks; in-person per patient request        - COVID positive 1/3; fully recovered        - AMA  Encounter for genetic screening -     INTEGRATED 2; second blood draw today  Screening for  genitourinary condition -     POC Urinalysis Dipstick OB  Sinus tachycardia       -  On Metoprolol XL 100 mg; has upcoming appt with Cardiology later this month; discussed transitioning to Labetalol if blood pressure will tolerate but likely okay to cont with Metoprolol and follow fetal growth as needed.   Anxiety       - Declines other medications for anxiety at this time  LGSIL on Pap smear of cervix - patient was told she needs Colpo and was going to do today, but on review, patient with negative HPV and LSIL; repeat Pap with co-testing in 1 year recommended per [facility]  Preterm labor symptoms and general obstetric precautions including but not limited to vaginal bleeding, contractions, leaking of fluid and fetal movement were reviewed in detail with the patient. Please refer to After Visit Summary for other counseling recommendations.   Return in about 4 weeks (around 11/16/2019) for Low risk OB; in-person.  Future Appointments  Date Time Provider Department Center  11/07/2019  9:20 AM Crenshaw, Madolyn Frieze, MD [facility]    Joselyn Arrow, MD

## 2019-10-21 LAB — INTEGRATED 2
AFP MoM: 1.51
Alpha-Fetoprotein: 90.5 ng/mL
Crown Rump Length: 61 mm
DIA MoM: 0.31
DIA Value: 61.8 pg/mL
Estriol, Unconjugated: 1.68 ng/mL
Gest. Age on Collection Date: 12.4 weeks
Gestational Age: 19.3 weeks
Maternal Age at EDD: 38.2 yr
Nuchal Translucency (NT): 1.1 mm
Nuchal Translucency MoM: 0.81
Number of Fetuses: 1
PAPP-A MoM: 2.2
PAPP-A Value: 2598.4 ng/mL
Test Results:: NEGATIVE
Weight: 119 [lb_av]
Weight: 125 [lb_av]
hCG MoM: 0.35
hCG Value: 8.7 IU/mL
uE3 MoM: 0.84

## 2019-10-27 NOTE — Progress Notes (Signed)
HPI: FU palpitations. Previous monitor in October of 2011 showed no significant arrhythmias. Cardionet 3/13 revealed sinus to sinus tachycardia with PACs. Repeat 3/16 showed sinus with pacs.Holter monitor 8/19 showed sinus bradycardia, normal sinus rhythm, sinus tachycardia, occasional PAC and rare PVC. Echocardiogram August 2020 showed normal LV systolic function. Since last seen, she has occasional brief palpitations not sustained.  She denies dyspnea, chest pain or syncope.  She is 5 months pregnant and is asking about changing beta-blockers as suggestion of her OB/GYN.  Current Outpatient Medications  Medication Sig Dispense Refill  . acetaminophen (TYLENOL) 500 MG tablet Take 1,000 mg by mouth every 6 (six) hours as needed for mild pain.    . Blood Pressure Monitor MISC For regular home bp monitoring during pregnancy 1 each 0  . metoprolol succinate (TOPROL-XL) 50 MG 24 hr tablet TAKE 2 TABLETS(100 MG) BY MOUTH DAILY 180 tablet 1  . omeprazole (PRILOSEC) 20 MG capsule Take 1 capsule (20 mg total) by mouth daily. 30 capsule 6  . ondansetron (ZOFRAN ODT) 8 MG disintegrating tablet Take 1 tablet (8 mg total) by mouth every 8 (eight) hours as needed for nausea or vomiting. 20 tablet 2   No current facility-administered medications for this visit.     Past Medical History:  Diagnosis Date  . Anxiety   . GERD (gastroesophageal reflux disease)   . Heart murmur   . History of Holter monitoring 06/2010 and 11/2011   Cardionet montior with sinus tachycardia, PAC's only, no arrhythmias either monitor  . Hx of echocardiogram 06/2010   normal  . Palpitations     Past Surgical History:  Procedure Laterality Date  . DENTAL SURGERY    . INDUCED ABORTION      Social History   Socioeconomic History  . Marital status: Single    Spouse name: Not on file  . Number of children: Not on file  . Years of education: Not on file  . Highest education level: Some college, no degree    Occupational History  . Occupation: [employer]  Tobacco Use  . Smoking status: Never Smoker  . Smokeless tobacco: Never Used  Substance and Sexual Activity  . Alcohol use: No    Alcohol/week: 0.0 standard drinks  . Drug use: No  . Sexual activity: Yes  Other Topics Concern  . Not on file  Social History Narrative   Lives with 2 children in an apartment on the second floor.     Works at [employer].  Education: high school.   Social Determinants of Health   Financial Resource Strain:   . Difficulty of Paying Living Expenses: Not on file  Food Insecurity:   . Worried About Programme researcher, broadcasting/film/video in the Last Year: Not on file  . Ran Out of Food in the Last Year: Not on file  Transportation Needs:   . Lack of Transportation (Medical): Not on file  . Lack of Transportation (Non-Medical): Not on file  Physical Activity:   . Days of Exercise per Week: Not on file  . Minutes of Exercise per Session: Not on file  Stress:   . Feeling of Stress : Not on file  Social Connections:   . Frequency of Communication with Friends and Family: Not on file  . Frequency of Social Gatherings with Friends and Family: Not on file  . Attends Religious Services: Not on file  . Active Member of Clubs or Organizations: Not on file  . Attends Banker  Meetings: Not on file  . Marital Status: Not on file  Intimate Partner Violence:   . Fear of Current or Ex-Partner: Not on file  . Emotionally Abused: Not on file  . Physically Abused: Not on file  . Sexually Abused: Not on file    Family History  Problem Relation Age of Onset  . Hypertension Other   . CAD Other   . Diabetes Maternal Grandmother   . Breast cancer Maternal Grandmother   . Healthy Son   . Healthy Sister        x 6  . Healthy Sister        x 2  . Healthy Son     ROS: no fevers or chills, productive cough, hemoptysis, dysphasia, odynophagia, melena, hematochezia, dysuria, hematuria, rash, seizure activity, orthopnea, PND,  pedal edema, claudication. Remaining systems are negative.  Physical Exam: Well-developed well-nourished in no acute distress.  Skin is warm and dry.  HEENT is normal.  Neck is supple.  Chest is clear to auscultation with normal expansion.  Cardiovascular exam is regular rate and rhythm.  Abdominal exam 64-month intrauterine pregnancy Extremities show no edema. neuro grossly intact  A/P  1 palpitations-Previous monitor showed sinus to sinus tachycardia with occasional PAC and PVC.  LV function is normal.  Her OB/GYN would like to potentially transition from metoprolol to labetalol during pregnancy.  We will await their direction.  If they would like we will discontinue metoprolol and treat with labetalol 100 mg twice daily and follow.  2 history of atypical chest pain-no recent symptoms.  We will not pursue further ischemia evaluation.   3 anxiety-Per primary care.  Kirk Ruths, MD

## 2019-11-07 ENCOUNTER — Other Ambulatory Visit: Payer: Self-pay

## 2019-11-07 ENCOUNTER — Ambulatory Visit: Payer: BC Managed Care – PPO | Admitting: Cardiology

## 2019-11-07 ENCOUNTER — Encounter: Payer: Self-pay | Admitting: Cardiology

## 2019-11-07 VITALS — BP 126/70 | HR 96 | Temp 97.7°F | Ht 65.0 in | Wt 129.0 lb

## 2019-11-07 DIAGNOSIS — R002 Palpitations: Secondary | ICD-10-CM | POA: Diagnosis not present

## 2019-11-07 DIAGNOSIS — F419 Anxiety disorder, unspecified: Secondary | ICD-10-CM | POA: Diagnosis not present

## 2019-11-07 DIAGNOSIS — R0789 Other chest pain: Secondary | ICD-10-CM

## 2019-11-07 NOTE — Patient Instructions (Signed)

## 2019-11-08 ENCOUNTER — Telehealth: Payer: Self-pay | Admitting: Cardiology

## 2019-11-08 NOTE — Telephone Encounter (Signed)
New message   Patient states that she saw floaters in front of her when she bent down today. Please call to discuss.

## 2019-11-08 NOTE — Telephone Encounter (Signed)
continue to monitor-  Patient states she recovered quickly once she stood up . The  Issue passed. SHE STATES SHE IS PREGNANT/   RN  Inform to keep hydrated contact ob  If needed  [patient states she can not keep liquids down at the moment

## 2019-11-16 ENCOUNTER — Encounter: Payer: Self-pay | Admitting: Women's Health

## 2019-11-16 ENCOUNTER — Ambulatory Visit (INDEPENDENT_AMBULATORY_CARE_PROVIDER_SITE_OTHER): Payer: Medicaid Other | Admitting: Women's Health

## 2019-11-16 ENCOUNTER — Other Ambulatory Visit: Payer: Self-pay

## 2019-11-16 VITALS — BP 131/76 | HR 113 | Wt 130.0 lb

## 2019-11-16 DIAGNOSIS — Z3482 Encounter for supervision of other normal pregnancy, second trimester: Secondary | ICD-10-CM

## 2019-11-16 DIAGNOSIS — R87612 Low grade squamous intraepithelial lesion on cytologic smear of cervix (LGSIL): Secondary | ICD-10-CM

## 2019-11-16 DIAGNOSIS — Z1389 Encounter for screening for other disorder: Secondary | ICD-10-CM

## 2019-11-16 DIAGNOSIS — Z130 Encounter for screening for diseases of the blood and blood-forming organs and certain disorders involving the immune mechanism: Secondary | ICD-10-CM

## 2019-11-16 DIAGNOSIS — Z331 Pregnant state, incidental: Secondary | ICD-10-CM

## 2019-11-16 LAB — POCT URINALYSIS DIPSTICK OB
Blood, UA: NEGATIVE
Glucose, UA: NEGATIVE
Ketones, UA: NEGATIVE
Leukocytes, UA: NEGATIVE
Nitrite, UA: NEGATIVE
POC,PROTEIN,UA: NEGATIVE

## 2019-11-16 NOTE — Progress Notes (Signed)
   LOW-RISK PREGNANCY VISIT Patient name: Paula Riley MRN [id number]  Date of birth: 1981/07/22 Chief Complaint:   Routine Prenatal Visit  History of Present Illness:   Paula Riley is a 39 y.o. [id number] female at [redacted]w[redacted]d with an Estimated Date of Delivery: 03/14/20 being seen today for ongoing management of a low-risk pregnancy.  Today she reports no complaints. Went to cardiologist Monday to f/u on tachycardia, on metoprolol for years. Had discussed switching to labetalol earlier in pregnancy and she didn't want to, now interested. Contractions: Not present.  .  Movement: Present. denies leaking of fluid. Review of Systems:   Pertinent items are noted in HPI Denies abnormal vaginal discharge w/ itching/odor/irritation, headaches, visual changes, shortness of breath, chest pain, abdominal pain, severe nausea/vomiting, or problems with urination or bowel movements unless otherwise stated above. Pertinent History Reviewed:  Reviewed past medical,surgical, social, obstetrical and family history.  Reviewed problem list, medications and allergies. Physical Assessment:   Vitals:   11/16/19 0959  BP: 131/76  Pulse: (!) 113  Weight: 130 lb (59 kg)  Body mass index is 21.63 kg/m.        Physical Examination:   General appearance: Well appearing, and in no distress  Mental status: Alert, oriented to person, place, and time  Skin: Warm & dry  Cardiovascular: Normal heart rate noted  Respiratory: Normal respiratory effort, no distress  Abdomen: Soft, gravid, nontender  Pelvic: Cervical exam deferred         Extremities: Edema: None  Fetal Status: Fetal Heart Rate (bpm): 144 Fundal Height: 24 cm Movement: Present    Chaperone: n/a    Results for orders placed or performed in visit on 11/16/19 (from the past 24 hour(s))  POC Urinalysis Dipstick OB   Collection Time: 11/16/19 10:05 AM  Result Value Ref Range   Color, UA     Clarity, UA     Glucose, UA Negative Negative   Bilirubin, UA       Ketones, UA neg    Spec Grav, UA     Blood, UA neg    pH, UA     POC,PROTEIN,UA Negative Negative, Trace, Small (1+), Moderate (2+), Large (3+), 4+   Urobilinogen, UA     Nitrite, UA neg    Leukocytes, UA Negative Negative   Appearance     Odor      Assessment & Plan:  1) Low-risk pregnancy [id number] at [redacted]w[redacted]d with an Estimated Date of Delivery: 03/14/20   2) Chronic tachycardia, on metoprolol 100mg  daily, interested in switching to Labetalol as discussed earlier in pregnancy. Will discuss w/ MD (not in office) and get back to her   Meds: No orders of the defined types were placed in this encounter.  Labs/procedures today: needs hemoglobinopathy eval, will add to pn2 next visit  Plan:  Continue routine obstetrical care  Next visit: prefers will be in person for pn2    Reviewed: Preterm labor symptoms and general obstetric precautions including but not limited to vaginal bleeding, contractions, leaking of fluid and fetal movement were reviewed in detail with the patient.  All questions were answered.   Follow-up: Return in about 4 weeks (around 12/14/2019) for LROB, PN2 & hemoglobinopathy, in person, CNM.  Orders Placed This Encounter  Procedures  . Hemoglobinopathy Evaluation  . POC Urinalysis Dipstick OB   12/16/2019 CNM, [facility] 11/16/2019 10:49 AM

## 2019-11-16 NOTE — Patient Instructions (Signed)
Paula Riley, I greatly value your feedback.  If you receive a survey following your visit with [place] today, we appreciate you taking the time to fill it out.  Thanks, Joellyn Haff, CNM, WHNP-BC   You will have your sugar test next visit.  Please do not eat or drink anything after midnight the night before you come, not even water.  You will be here for at least two hours.  Please make an appointment online for the bloodwork at [email or web address] for 8:30am (or as close to this as possible). Make sure you select the [facility] service center. The day of the appointment, check in with our office first, then you will go to [facility] to start the sugar test.    [facility] HAS MOVED!!! It is now [facility] at [facility] ([address] [place], [place] [address]) Entrance C, located off of [address] valet parking  Go to [email or web address] to register for FREE online childbirth classes   Call the office [phone]) or go to [facility] if:  You begin to have strong, frequent contractions  Your water breaks.  Sometimes it is a big gush of fluid, sometimes it is just a trickle that keeps getting your panties wet or running down your legs  You have vaginal bleeding.  It is normal to have a small amount of spotting if your cervix was checked.   You don't feel your baby moving like normal.  If you don't, get you something to eat and drink and lay down and focus on feeling your baby move.   If your baby is still not moving like normal, you should call the office or go to [facility].  [place] Pediatricians/Family Doctors:  [facility] [phone]            [facility] [phone]                 [facility] [phone] (usually not accepting new patients unless you have family there already, you are always welcome to call and ask)       [facility] [phone]       [facility] Pediatricians/Family  Doctors:   [facility]: [phone]  [facility]: [phone]  [facility]: [phone]  [facility] Doctors:   [facility]: [phone]   [facility]: [phone]  [facility] Doctors:  [facility]: [phone]   Home Blood Pressure Monitoring for Patients   Your provider has recommended that you check your blood pressure (BP) at least once a week at home. If you do not have a blood pressure cuff at home, one will be provided for you. Contact your provider if you have not received your monitor within 1 week.   Helpful Tips for Accurate Home Blood Pressure Checks  . Don't smoke, exercise, or drink caffeine 30 minutes before checking your BP . Use the restroom before checking your BP (a full bladder can raise your pressure) . Relax in a comfortable upright chair . Feet on the ground . Left arm resting comfortably on a flat surface at the level of your heart . Legs uncrossed . Back supported . Sit quietly and don't talk . Place the cuff on your bare arm . Adjust snuggly, so that only two fingertips can fit between your skin and the top of the cuff . Check 2 readings separated by at least one minute . Keep a log of your BP  readings . For a visual, please reference this diagram: http://ccnc.care/bpdiagram  Provider Name: [facility]     Phone: [phone]  Zone 1: ALL CLEAR  Continue to monitor your symptoms:  . BP reading is less than 140 (top number) or less than 90 (bottom number)  . No right upper stomach pain . No headaches or seeing spots . No feeling nauseated or throwing up . No swelling in face and hands  Zone 2: CAUTION Call your doctor's office for any of the following:  . BP reading is greater than 140 (top number) or greater than 90 (bottom number)  . Stomach pain under your ribs in the middle or right side . Headaches or seeing spots . Feeling  nauseated or throwing up . Swelling in face and hands  Zone 3: EMERGENCY  Seek immediate medical care if you have any of the following:  . BP reading is greater than160 (top number) or greater than 110 (bottom number) . Severe headaches not improving with Tylenol . Serious difficulty catching your breath . Any worsening symptoms from Zone 2   Second Trimester of Pregnancy The second trimester is from week 13 through week 28, months 4 through 6. The second trimester is often a time when you feel your best. Your body has also adjusted to being pregnant, and you begin to feel better physically. Usually, morning sickness has lessened or quit completely, you may have more energy, and you may have an increase in appetite. The second trimester is also a time when the fetus is growing rapidly. At the end of the sixth month, the fetus is about 9 inches long and weighs about 1 pounds. You will likely begin to feel the baby move (quickening) between 18 and 20 weeks of the pregnancy. BODY CHANGES Your body goes through many changes during pregnancy. The changes vary from woman to woman.   Your weight will continue to increase. You will notice your lower abdomen bulging out.  You may begin to get stretch marks on your hips, abdomen, and breasts.  You may develop headaches that can be relieved by medicines approved by your health care provider.  You may urinate more often because the fetus is pressing on your bladder.  You may develop or continue to have heartburn as a result of your pregnancy.  You may develop constipation because certain hormones are causing the muscles that push waste through your intestines to slow down.  You may develop hemorrhoids or swollen, bulging veins (varicose veins).  You may have back pain because of the weight gain and pregnancy hormones relaxing your joints between the bones in your pelvis and as a result of a shift in weight and the muscles that support your  balance.  Your breasts will continue to grow and be tender.  Your gums may bleed and may be sensitive to brushing and flossing.  Dark spots or blotches (chloasma, mask of pregnancy) may develop on your face. This will likely fade after the baby is born.  A dark line from your belly button to the pubic area (linea nigra) may appear. This will likely fade after the baby is born.  You may have changes in your hair. These can include thickening of your hair, rapid growth, and changes in texture. Some women also have hair loss during or after pregnancy, or hair that feels dry or thin. Your hair will most likely return to normal after your baby is born. WHAT TO EXPECT AT YOUR PRENATAL VISITS During  a routine prenatal visit:  You will be weighed to make sure you and the fetus are growing normally.  Your blood pressure will be taken.  Your abdomen will be measured to track your baby's growth.  The fetal heartbeat will be listened to.  Any test results from the previous visit will be discussed. Your health care provider may ask you:  How you are feeling.  If you are feeling the baby move.  If you have had any abnormal symptoms, such as leaking fluid, bleeding, severe headaches, or abdominal cramping.  If you have any questions. Other tests that may be performed during your second trimester include:  Blood tests that check for:  Low iron levels (anemia).  Gestational diabetes (between 24 and 28 weeks).  Rh antibodies.  Urine tests to check for infections, diabetes, or protein in the urine.  An ultrasound to confirm the proper growth and development of the baby.  An amniocentesis to check for possible genetic problems.  Fetal screens for spina bifida and Down syndrome. HOME CARE INSTRUCTIONS   Avoid all smoking, herbs, alcohol, and unprescribed drugs. These chemicals affect the formation and growth of the baby.  Follow your health care provider's instructions regarding  medicine use. There are medicines that are either safe or unsafe to take during pregnancy.  Exercise only as directed by your health care provider. Experiencing uterine cramps is a good sign to stop exercising.  Continue to eat regular, healthy meals.  Wear a good support bra for breast tenderness.  Do not use hot tubs, steam rooms, or saunas.  Wear your seat belt at all times when driving.  Avoid raw meat, uncooked cheese, cat litter boxes, and soil used by cats. These carry germs that can cause birth defects in the baby.  Take your prenatal vitamins.  Try taking a stool softener (if your health care provider approves) if you develop constipation. Eat more high-fiber foods, such as fresh vegetables or fruit and whole grains. Drink plenty of fluids to keep your urine clear or pale yellow.  Take warm sitz baths to soothe any pain or discomfort caused by hemorrhoids. Use hemorrhoid cream if your health care provider approves.  If you develop varicose veins, wear support hose. Elevate your feet for 15 minutes, 3-4 times a day. Limit salt in your diet.  Avoid heavy lifting, wear low heel shoes, and practice good posture.  Rest with your legs elevated if you have leg cramps or low back pain.  Visit your dentist if you have not gone yet during your pregnancy. Use a soft toothbrush to brush your teeth and be gentle when you floss.  A sexual relationship may be continued unless your health care provider directs you otherwise.  Continue to go to all your prenatal visits as directed by your health care provider. SEEK MEDICAL CARE IF:   You have dizziness.  You have mild pelvic cramps, pelvic pressure, or nagging pain in the abdominal area.  You have persistent nausea, vomiting, or diarrhea.  You have a bad smelling vaginal discharge.  You have pain with urination. SEEK IMMEDIATE MEDICAL CARE IF:   You have a fever.  You are leaking fluid from your vagina.  You have spotting or  bleeding from your vagina.  You have severe abdominal cramping or pain.  You have rapid weight gain or loss.  You have shortness of breath with chest pain.  You notice sudden or extreme swelling of your face, hands, ankles, feet, or legs.  You  have not felt your baby move in over an hour.  You have severe headaches that do not go away with medicine.  You have vision changes. Document Released: 08/26/2001 Document Revised: 09/06/2013 Document Reviewed: 11/02/2012 [facility] Patient Information 2015 [place], [place]. This information is not intended to replace advice given to you by your health care provider. Make sure you discuss any questions you have with your health care provider.  PROTECT YOURSELF & YOUR BABY FROM THE FLU! Because you are pregnant, we at [facility], along with the Centers for Disease Control (CDC), recommend that you receive the flu vaccine to protect yourself and your baby from the flu. The flu is more likely to cause severe illness in pregnant women than in women of reproductive age who are not pregnant. Changes in the immune system, heart, and lungs during pregnancy make pregnant women (and women up to two weeks postpartum) more prone to severe illness from flu, including illness resulting in hospitalization. Flu also may be harmful for a pregnant woman's developing baby. A common flu symptom is fever, which may be associated with neural tube defects and other adverse outcomes for a developing baby. Getting vaccinated can also help protect a baby after birth from flu. (Mom passes antibodies onto the developing baby during her pregnancy.)  A Flu Vaccine is the Best Protection Against Flu Getting a flu vaccine is the first and most important step in protecting against flu. Pregnant women should get a flu shot and not the live attenuated influenza vaccine (LAIV), also known as nasal spray flu vaccine. Flu vaccines given during pregnancy help protect both the mother and her  baby from flu. Vaccination has been shown to reduce the risk of flu-associated acute respiratory infection in pregnant women by up to one-half. A 2018 study showed that getting a flu shot reduced a pregnant woman's risk of being hospitalized with flu by an average of 40 percent. Pregnant women who get a flu vaccine are also helping to protect their babies from flu illness for the first several months after their birth, when they are too young to get vaccinated.   A Long Record of Safety for Flu Shots in Pregnant Women Flu shots have been given to millions of pregnant women over many years with a good safety record. There is a lot of evidence that flu vaccines can be given safely during pregnancy; though these data are limited for the first trimester. The CDC recommends that pregnant women get vaccinated during any trimester of their pregnancy. It is very important for pregnant women to get the flu shot.   Other Preventive Actions In addition to getting a flu shot, pregnant women should take the same everyday preventive actions the CDC recommends of everyone, including covering coughs, washing hands often, and avoiding people who are sick.  Symptoms and Treatment If you get sick with flu symptoms call your doctor right away. There are antiviral drugs that can treat flu illness and prevent serious flu complications. The CDC recommends prompt treatment for people who have influenza infection or suspected influenza infection and who are at high risk of serious flu complications, such as people with asthma, diabetes (including gestational diabetes), or heart disease. Early treatment of influenza in hospitalized pregnant women has been shown to reduce the length of the hospital stay.  Symptoms Flu symptoms include fever, cough, sore throat, runny or stuffy nose, body aches, headache, chills and fatigue. Some people may also have vomiting and diarrhea. People may be infected with the  flu and have respiratory  symptoms without a fever.  Early Treatment is Important for Pregnant Women Treatment should begin as soon as possible because antiviral drugs work best when started early (within 48 hours after symptoms start). Antiviral drugs can make your flu illness milder and make you feel better faster. They may also prevent serious health problems that can result from flu illness. Oral oseltamivir (Tamiflu) is the preferred treatment for pregnant women because it has the most studies available to suggest that it is safe and beneficial. Antiviral drugs require a prescription from your provider. Having a fever caused by flu infection or other infections early in pregnancy may be linked to birth defects in a baby. In addition to taking antiviral drugs, pregnant women who get a fever should treat their fever with Tylenol (acetaminophen) and contact their provider immediately.  When to [place] If you are pregnant and have any of these signs, seek care immediately:  Difficulty breathing or shortness of breath  Pain or pressure in the chest or abdomen  Sudden dizziness  Confusion  Severe or persistent vomiting  High fever that is not responding to Tylenol (or store brand equivalent)  Decreased or no movement of your baby  [email or web address]

## 2019-11-22 ENCOUNTER — Telehealth: Payer: Self-pay | Admitting: *Deleted

## 2019-11-22 NOTE — Telephone Encounter (Signed)
Pt requesting a call back about the medication change that Selena Batten is wanting to make.

## 2019-11-22 NOTE — Telephone Encounter (Signed)
LMOVM for pt to return my call.  Cheral Marker, CNM, WHNP-BC 11/22/2019 1:19 PM

## 2019-11-27 ENCOUNTER — Other Ambulatory Visit: Payer: Self-pay | Admitting: Cardiology

## 2019-11-27 MED ORDER — METOPROLOL SUCCINATE ER 50 MG PO TB24: ORAL_TABLET | ORAL | 1 refills | Status: DC

## 2019-11-30 ENCOUNTER — Telehealth: Payer: Self-pay | Admitting: Cardiology

## 2019-11-30 MED ORDER — LABETALOL HCL 100 MG PO TABS: 100.0000 mg | ORAL_TABLET | Freq: Two times a day (BID) | ORAL | 3 refills | Status: DC

## 2019-11-30 NOTE — Telephone Encounter (Signed)
Spoke with patient. Patient reports Dr. [name] asked her to notify him of the changes her OB wanted to make with her medications. OB is recommending changing to Labetalol. Patient would like recommendations on what dosage to be used. Will route to Dr. [name] for review.

## 2019-11-30 NOTE — Telephone Encounter (Signed)
DC metoprolol; begin labetalol 100 mg BID Olga Millers

## 2019-11-30 NOTE — Telephone Encounter (Signed)
Left message for patient with Dr Creshaw's recommendations.   New script sent to the pharmacy  

## 2019-11-30 NOTE — Telephone Encounter (Signed)
New message   Patient states that the gynecologist has patient on labetalol. Patient has questions about this medication. Please call.

## 2019-12-14 ENCOUNTER — Encounter: Payer: Self-pay | Admitting: Women's Health

## 2019-12-14 ENCOUNTER — Other Ambulatory Visit: Payer: BC Managed Care – PPO

## 2019-12-14 ENCOUNTER — Other Ambulatory Visit: Payer: Self-pay

## 2019-12-14 ENCOUNTER — Ambulatory Visit (INDEPENDENT_AMBULATORY_CARE_PROVIDER_SITE_OTHER): Payer: Medicaid Other | Admitting: Women's Health

## 2019-12-14 VITALS — BP 127/74 | HR 103 | Wt 134.2 lb

## 2019-12-14 DIAGNOSIS — O99342 Other mental disorders complicating pregnancy, second trimester: Secondary | ICD-10-CM

## 2019-12-14 DIAGNOSIS — Z23 Encounter for immunization: Secondary | ICD-10-CM

## 2019-12-14 DIAGNOSIS — O99891 Other specified diseases and conditions complicating pregnancy: Secondary | ICD-10-CM

## 2019-12-14 DIAGNOSIS — R000 Tachycardia, unspecified: Secondary | ICD-10-CM

## 2019-12-14 DIAGNOSIS — O99352 Diseases of the nervous system complicating pregnancy, second trimester: Secondary | ICD-10-CM

## 2019-12-14 DIAGNOSIS — Z3482 Encounter for supervision of other normal pregnancy, second trimester: Secondary | ICD-10-CM

## 2019-12-14 DIAGNOSIS — G4700 Insomnia, unspecified: Secondary | ICD-10-CM

## 2019-12-14 DIAGNOSIS — Z1389 Encounter for screening for other disorder: Secondary | ICD-10-CM

## 2019-12-14 DIAGNOSIS — Z131 Encounter for screening for diabetes mellitus: Secondary | ICD-10-CM

## 2019-12-14 DIAGNOSIS — Z3A27 27 weeks gestation of pregnancy: Secondary | ICD-10-CM

## 2019-12-14 DIAGNOSIS — Z9189 Other specified personal risk factors, not elsewhere classified: Secondary | ICD-10-CM

## 2019-12-14 DIAGNOSIS — F419 Anxiety disorder, unspecified: Secondary | ICD-10-CM

## 2019-12-14 DIAGNOSIS — Z331 Pregnant state, incidental: Secondary | ICD-10-CM

## 2019-12-14 LAB — POCT URINALYSIS DIPSTICK OB
Blood, UA: NEGATIVE
Glucose, UA: NEGATIVE
Ketones, UA: NEGATIVE
Leukocytes, UA: NEGATIVE
Nitrite, UA: NEGATIVE
POC,PROTEIN,UA: NEGATIVE

## 2019-12-14 NOTE — Progress Notes (Signed)
LOW-RISK PREGNANCY VISIT Patient name: Paula Riley MRN [id number]  Date of birth: Mar 10, 1981 Chief Complaint:   Routine Prenatal Visit  History of Present Illness:   Paula Riley is a 39 y.o. [id number] female at [redacted]w[redacted]d with an Estimated Date of Delivery: 03/14/20 being seen today for ongoing management of a low-risk pregnancy.  @PHQ9 @ Today she reports declines switch from metoprolol to labetalol for tachycardia. Trouble sleeping, will jerk and wake herself up. Some restless legs. Anxiety- doesn't use mychart b/c makes her more anxious seeing results/big words, would like referral for therapy. Contractions: Not present. Vag. Bleeding: None.  Movement: Present. denies leaking of fluid. Review of Systems:   Pertinent items are noted in HPI Denies abnormal vaginal discharge w/ itching/odor/irritation, headaches, visual changes, shortness of breath, chest pain, abdominal pain, severe nausea/vomiting, or problems with urination or bowel movements unless otherwise stated above. Pertinent History Reviewed:  Reviewed past medical,surgical, social, obstetrical and family history.  Reviewed problem list, medications and allergies. Physical Assessment:   Vitals:   12/14/19 0910  BP: 127/74  Pulse: (!) 103  Weight: 134 lb 3.2 oz (60.9 kg)  Body mass index is 22.33 kg/m.        Physical Examination:   General appearance: Well appearing, and in no distress  Mental status: Alert, oriented to person, place, and time  Skin: Warm & dry  Cardiovascular: Normal heart rate noted  Respiratory: Normal respiratory effort, no distress  Abdomen: Soft, gravid, nontender  Pelvic: Cervical exam deferred         Extremities: Edema: Trace  Fetal Status: Fetal Heart Rate (bpm): 148 Fundal Height: 26 cm Movement: Present    Chaperone: n/a    Results for orders placed or performed in visit on 12/14/19 (from the past 24 hour(s))  POC Urinalysis Dipstick OB   Collection Time: 12/14/19  9:11 AM  Result Value  Ref Range   Color, UA     Clarity, UA     Glucose, UA Negative Negative   Bilirubin, UA     Ketones, UA n    Spec Grav, UA     Blood, UA n    pH, UA     POC,PROTEIN,UA Negative Negative, Trace, Small (1+), Moderate (2+), Large (3+), 4+   Urobilinogen, UA     Nitrite, UA n    Leukocytes, UA Negative Negative   Appearance     Odor      Assessment & Plan:  1) Low-risk pregnancy [id number] at [redacted]w[redacted]d with an Estimated Date of Delivery: 03/14/20   2) Trouble sleeping, gave printed prevention/relief measures   3) Tachycardia> on metoprolol, declines switch to labetalol to decrease r/f FGR, will monitor EFW q4wks  4) Anxiety> referral to [facility] for therapy, note routed to [facility]   Meds: No orders of the defined types were placed in this encounter.  Labs/procedures today: pn2, tdap  Plan:  Continue routine obstetrical care  Next visit: prefers will be in person for efw u/s    Reviewed: Preterm labor symptoms and general obstetric precautions including but not limited to vaginal bleeding, contractions, leaking of fluid and fetal movement were reviewed in detail with the patient.  All questions were answered.   Follow-up: Return in about 3 weeks (around 01/04/2020) for LROB, US:EFW, in person, CNM.  Orders Placed This Encounter  Procedures  . US OB Follow Up  . Tdap vaccine greater than or equal to 7yo IM  . POC Urinalysis Dipstick OB   Roma Schanz CNM, WHNP-BC  12/14/2019 9:36 AM

## 2019-12-14 NOTE — Patient Instructions (Addendum)
Paula Riley, I greatly value your feedback.  If you receive a survey following your visit with [place] today, we appreciate you taking the time to fill it out.  Thanks, Knute Neu, CNM, WHNP-BC   [facility] [place] [facility] ([place], [place] [address]) Entrance C, located off of [address] parking  Go to [email or web address] to register for FREE online childbirth classes   Call the office [phone]) or go to [facility] if:  You begin to have strong, frequent contractions  Your water breaks.  Sometimes it is a big gush of fluid, sometimes it is just a trickle that keeps getting your panties wet or running down your legs  You have vaginal bleeding.  It is normal to have a small amount of spotting if your cervix was checked.   You don't feel your baby moving like normal.  If you don't, get you something to eat and drink and lay down and focus on feeling your baby move.  You should feel at least 10 movements in 2 hours.  If you don't, you should call the office or go to [facility].    Tdap Vaccine  It is recommended that you get the Tdap vaccine during the third trimester of EACH pregnancy to help protect your baby from getting pertussis (whooping cough)  27-36 weeks is the BEST time to do this so that you can pass the protection on to your baby. During pregnancy is better than after pregnancy, but if you are unable to get it during pregnancy it will be offered at the hospital.   You can get this vaccine with [place], at the health department, your family doctor, or some local pharmacies  Everyone who will be around your baby should also be up-to-date on their vaccines before the baby comes. Adults (who are not pregnant) only need 1 dose of Tdap during adulthood.   Tips to Help You Sleep Better:   Get into a bedtime routine, try to do the same thing every night before going to bed to try to help your body wind down  Warm baths  Avoid  caffeine for at least 3 hours before going to sleep   Keep your room at a slightly cooler temperature, can try running a fan  Turn off TV, lights, phone, electronics  Lots of pillows if needed to help you get comfortable  Lavender scented items can help you sleep. You can place lavender essential oil on a cotton ball and place under your pillowcase, or place in a diffuser. [employer] has a lavender scented sleep line (plug-ins, sprays, etc). Look in the pillow aisle for lavender scented pillows.   If none of the above things help, you can try 1/2 to 1 tablet of benadryl, unisom, or tylenol pm. Do not take this every night, only when you really need it.    [place] Pediatricians/Family Doctors:  [facility] [phone]                 [place] [phone] (usually not accepting new patients unless you have family there already, you are always welcome to call and ask)       [facility] Department [phone]       [facility] Pediatricians/Family Doctors:   [place]: [phone]  [facility]: [place]: [place] Doctors:  [facility]: [phone]   [facility]: [phone]  [facility] Doctors:  [facility]: [phone]   Home Blood Pressure Monitoring for Patients   Your provider has recommended that you check your blood pressure (BP) at least once a week at home. If you do not have a blood pressure cuff at home, one will be provided for you. Contact your provider if you have not received your monitor within 1 week.   Helpful Tips for Accurate Home Blood Pressure Checks  . Don't smoke, exercise, or drink caffeine 30 minutes before checking your BP . Use the restroom before checking your BP (a full bladder can raise your pressure) . Relax in a  comfortable upright chair . Feet on the ground . Left arm resting comfortably on a flat surface at the level of your heart . Legs uncrossed . Back supported . Sit quietly and don't talk . Place the cuff on your bare arm . Adjust snuggly, so that only two fingertips can fit between your skin and the top of the cuff . Check 2 readings separated by at least one minute . Keep a log of your BP readings . For a visual, please reference this diagram: http://ccnc.care/bpdiagram  Provider Name: [facility]     Phone: [phone]  Zone 1: ALL CLEAR  Continue to monitor your symptoms:  . BP reading is less than 140 (top number) or less than 90 (bottom number)  . No right upper stomach pain . No headaches or seeing spots . No feeling nauseated or throwing up . No swelling in face and hands  Zone 2: CAUTION Call your doctor's office for any of the following:  . BP reading is greater than 140 (top number) or greater than 90 (bottom number)  . Stomach pain under your ribs in the middle or right side . Headaches or seeing spots . Feeling nauseated or throwing up . Swelling in face and hands  Zone 3: EMERGENCY  Seek immediate medical care if you have any of the following:  . BP reading is greater than160 (top number) or greater than 110 (bottom number) . Severe headaches not improving with Tylenol . Serious difficulty catching your breath . Any worsening symptoms from Zone 2   Third Trimester of Pregnancy The third trimester is from week 29 through week 42, months 7 through 9. The third trimester is a time when the fetus is growing rapidly. At the end of the ninth month, the fetus is about 20 inches in length and weighs 6-10 pounds.  BODY CHANGES Your body goes through many changes during pregnancy. The changes vary from woman to woman.   Your weight will continue to increase. You can expect to gain 25-35 pounds (11-16 kg) by the end of the pregnancy.  You may begin to get stretch  marks on your hips, abdomen, and breasts.  You may urinate more often because the fetus is moving lower into your pelvis and pressing on your bladder.  You may develop or continue to have heartburn as a result of your pregnancy.  You may develop constipation because certain hormones are causing the muscles that push waste through your intestines to slow down.  You may develop hemorrhoids or swollen, bulging veins (varicose veins).  You may have pelvic pain because of the weight gain and pregnancy hormones relaxing your joints between the bones in your pelvis. Backaches may result from overexertion of the muscles supporting your posture.  You may have  changes in your hair. These can include thickening of your hair, rapid growth, and changes in texture. Some women also have hair loss during or after pregnancy, or hair that feels dry or thin. Your hair will most likely return to normal after your baby is born.  Your breasts will continue to grow and be tender. A yellow discharge may leak from your breasts called colostrum.  Your belly button may stick out.  You may feel short of breath because of your expanding uterus.  You may notice the fetus "dropping," or moving lower in your abdomen.  You may have a bloody mucus discharge. This usually occurs a few days to a week before labor begins.  Your cervix becomes thin and soft (effaced) near your due date. WHAT TO EXPECT AT YOUR PRENATAL EXAMS  You will have prenatal exams every 2 weeks until week 36. Then, you will have weekly prenatal exams. During a routine prenatal visit:  You will be weighed to make sure you and the fetus are growing normally.  Your blood pressure is taken.  Your abdomen will be measured to track your baby's growth.  The fetal heartbeat will be listened to.  Any test results from the previous visit will be discussed.  You may have a cervical check near your due date to see if you have effaced. At around 36 weeks,  your caregiver will check your cervix. At the same time, your caregiver will also perform a test on the secretions of the vaginal tissue. This test is to determine if a type of bacteria, Group B streptococcus, is present. Your caregiver will explain this further. Your caregiver may ask you:  What your birth plan is.  How you are feeling.  If you are feeling the baby move.  If you have had any abnormal symptoms, such as leaking fluid, bleeding, severe headaches, or abdominal cramping.  If you have any questions. Other tests or screenings that may be performed during your third trimester include:  Blood tests that check for low iron levels (anemia).  Fetal testing to check the health, activity level, and growth of the fetus. Testing is done if you have certain medical conditions or if there are problems during the pregnancy. FALSE LABOR You may feel small, irregular contractions that eventually go away. These are called Braxton Hicks contractions, or false labor. Contractions may last for hours, days, or even weeks before true labor sets in. If contractions come at regular intervals, intensify, or become painful, it is best to be seen by your caregiver.  SIGNS OF LABOR   Menstrual-like cramps.  Contractions that are 5 minutes apart or less.  Contractions that start on the top of the uterus and spread down to the lower abdomen and back.  A sense of increased pelvic pressure or back pain.  A watery or bloody mucus discharge that comes from the vagina. If you have any of these signs before the 37th week of pregnancy, call your caregiver right away. You need to go to the hospital to get checked immediately. HOME CARE INSTRUCTIONS   Avoid all smoking, herbs, alcohol, and unprescribed drugs. These chemicals affect the formation and growth of the baby.  Follow your caregiver's instructions regarding medicine use. There are medicines that are either safe or unsafe to take during  pregnancy.  Exercise only as directed by your caregiver. Experiencing uterine cramps is a good sign to stop exercising.  Continue to eat regular, healthy meals.  Wear a good support bra for  breast tenderness.  Do not use hot tubs, steam rooms, or saunas.  Wear your seat belt at all times when driving.  Avoid raw meat, uncooked cheese, cat litter boxes, and soil used by cats. These carry germs that can cause birth defects in the baby.  Take your prenatal vitamins.  Try taking a stool softener (if your caregiver approves) if you develop constipation. Eat more high-fiber foods, such as fresh vegetables or fruit and whole grains. Drink plenty of fluids to keep your urine clear or pale yellow.  Take warm sitz baths to soothe any pain or discomfort caused by hemorrhoids. Use hemorrhoid cream if your caregiver approves.  If you develop varicose veins, wear support hose. Elevate your feet for 15 minutes, 3-4 times a day. Limit salt in your diet.  Avoid heavy lifting, wear low heal shoes, and practice good posture.  Rest a lot with your legs elevated if you have leg cramps or low back pain.  Visit your dentist if you have not gone during your pregnancy. Use a soft toothbrush to brush your teeth and be gentle when you floss.  A sexual relationship may be continued unless your caregiver directs you otherwise.  Do not travel far distances unless it is absolutely necessary and only with the approval of your caregiver.  Take prenatal classes to understand, practice, and ask questions about the labor and delivery.  Make a trial run to the hospital.  Pack your hospital bag.  Prepare the baby's nursery.  Continue to go to all your prenatal visits as directed by your caregiver. SEEK MEDICAL CARE IF:  You are unsure if you are in labor or if your water has broken.  You have dizziness.  You have mild pelvic cramps, pelvic pressure, or nagging pain in your abdominal area.  You have  persistent nausea, vomiting, or diarrhea.  You have a bad smelling vaginal discharge.  You have pain with urination. SEEK IMMEDIATE MEDICAL CARE IF:   You have a fever.  You are leaking fluid from your vagina.  You have spotting or bleeding from your vagina.  You have severe abdominal cramping or pain.  You have rapid weight loss or gain.  You have shortness of breath with chest pain.  You notice sudden or extreme swelling of your face, hands, ankles, feet, or legs.  You have not felt your baby move in over an hour.  You have severe headaches that do not go away with medicine.  You have vision changes. Document Released: 08/26/2001 Document Revised: 09/06/2013 Document Reviewed: 11/02/2012 [facility] Patient Information 2015 [place], [place]. This information is not intended to replace advice given to you by your health care provider. Make sure you discuss any questions you have with your health care provider.  PROTECT YOURSELF & YOUR BABY FROM THE FLU! Because you are pregnant, we at [facility], along with the Centers for Disease Control (CDC), recommend that you receive the flu vaccine to protect yourself and your baby from the flu. The flu is more likely to cause severe illness in pregnant women than in women of reproductive age who are not pregnant. Changes in the immune system, heart, and lungs during pregnancy make pregnant women (and women up to two weeks postpartum) more prone to severe illness from flu, including illness resulting in hospitalization. Flu also may be harmful for a pregnant woman's developing baby. A common flu symptom is fever, which may be associated with neural tube defects and other adverse outcomes for a developing baby.  Getting vaccinated can also help protect a baby after birth from flu. (Mom passes antibodies onto the developing baby during her pregnancy.)  A Flu Vaccine is the Best Protection Against Flu Getting a flu vaccine is the first and most  important step in protecting against flu. Pregnant women should get a flu shot and not the live attenuated influenza vaccine (LAIV), also known as nasal spray flu vaccine. Flu vaccines given during pregnancy help protect both the mother and her baby from flu. Vaccination has been shown to reduce the risk of flu-associated acute respiratory infection in pregnant women by up to one-half. A 2018 study showed that getting a flu shot reduced a pregnant woman's risk of being hospitalized with flu by an average of 40 percent. Pregnant women who get a flu vaccine are also helping to protect their babies from flu illness for the first several months after their birth, when they are too young to get vaccinated.   A Long Record of Safety for Flu Shots in Pregnant Women Flu shots have been given to millions of pregnant women over many years with a good safety record. There is a lot of evidence that flu vaccines can be given safely during pregnancy; though these data are limited for the first trimester. The CDC recommends that pregnant women get vaccinated during any trimester of their pregnancy. It is very important for pregnant women to get the flu shot.   Other Preventive Actions In addition to getting a flu shot, pregnant women should take the same everyday preventive actions the CDC recommends of everyone, including covering coughs, washing hands often, and avoiding people who are sick.  Symptoms and Treatment If you get sick with flu symptoms call your doctor right away. There are antiviral drugs that can treat flu illness and prevent serious flu complications. The CDC recommends prompt treatment for people who have influenza infection or suspected influenza infection and who are at high risk of serious flu complications, such as people with asthma, diabetes (including gestational diabetes), or heart disease. Early treatment of influenza in hospitalized pregnant women has been shown to reduce the length of the  hospital stay.  Symptoms Flu symptoms include fever, cough, sore throat, runny or stuffy nose, body aches, headache, chills and fatigue. Some people may also have vomiting and diarrhea. People may be infected with the flu and have respiratory symptoms without a fever.  Early Treatment is Important for Pregnant Women Treatment should begin as soon as possible because antiviral drugs work best when started early (within 48 hours after symptoms start). Antiviral drugs can make your flu illness milder and make you feel better faster. They may also prevent serious health problems that can result from flu illness. Oral oseltamivir (Tamiflu) is the preferred treatment for pregnant women because it has the most studies available to suggest that it is safe and beneficial. Antiviral drugs require a prescription from your provider. Having a fever caused by flu infection or other infections early in pregnancy may be linked to birth defects in a baby. In addition to taking antiviral drugs, pregnant women who get a fever should treat their fever with Tylenol (acetaminophen) and contact their provider immediately.  When to Seek Emergency Medical Care If you are pregnant and have any of these signs, seek care immediately:  Difficulty breathing or shortness of breath  Pain or pressure in the chest or abdomen  Sudden dizziness  Confusion  Severe or persistent vomiting  High fever that is not responding to Tylenol (  or store brand equivalent)  Decreased or no movement of your baby  [email or web address]

## 2019-12-15 ENCOUNTER — Other Ambulatory Visit: Payer: Self-pay | Admitting: Women's Health

## 2019-12-15 DIAGNOSIS — D649 Anemia, unspecified: Secondary | ICD-10-CM | POA: Insufficient documentation

## 2019-12-15 LAB — RPR: RPR Ser Ql: NONREACTIVE

## 2019-12-15 LAB — CBC
Hematocrit: 26.7 % — ABNORMAL LOW (ref 34.0–46.6)
Hemoglobin: 8.8 g/dL — ABNORMAL LOW (ref 11.1–15.9)
MCH: 29.9 pg (ref 26.6–33.0)
MCHC: 33 g/dL (ref 31.5–35.7)
MCV: 91 fL (ref 79–97)
Platelets: 275 10*3/uL (ref 150–450)
RBC: 2.94 x10E6/uL — ABNORMAL LOW (ref 3.77–5.28)
RDW: 12.3 % (ref 11.7–15.4)
WBC: 7.1 10*3/uL (ref 3.4–10.8)

## 2019-12-15 LAB — GLUCOSE TOLERANCE, 2 HOURS W/ 1HR
Glucose, 1 hour: 75 mg/dL (ref 65–179)
Glucose, 2 hour: 75 mg/dL (ref 65–152)
Glucose, Fasting: 77 mg/dL (ref 65–91)

## 2019-12-15 LAB — ANTIBODY SCREEN: Antibody Screen: NEGATIVE

## 2019-12-15 LAB — HIV ANTIBODY (ROUTINE TESTING W REFLEX): HIV Screen 4th Generation wRfx: NONREACTIVE

## 2019-12-15 MED ORDER — FERROUS SULFATE 325 (65 FE) MG PO TABS: 325.0000 mg | ORAL_TABLET | Freq: Two times a day (BID) | ORAL | 3 refills | Status: DC

## 2019-12-20 ENCOUNTER — Telehealth: Payer: Self-pay | Admitting: *Deleted

## 2019-12-20 NOTE — Telephone Encounter (Signed)
-----   Message from Cheral Marker, [place] sent at 12/20/2019  2:06 PM EDT ----- Hasn't read [employer]. Please let her know that she is anemic, I have sent in rx for fe to her pharmacy, make sure she's taking pnv daily, increase fe-rich foods: red meats, green leafy vegs, beans, etc. Thanks!

## 2019-12-20 NOTE — Telephone Encounter (Signed)
Pt aware that she is anemic and iron pill has been sent to pharmacy. Pt is taking her prenatal daily. I advised to continue taking prenatal and increase iron rich foods like red meat, green leafy veg and beans. Advised to take iron with OJ for better absorption. Pt voiced understanding. JSY

## 2020-01-04 ENCOUNTER — Ambulatory Visit (INDEPENDENT_AMBULATORY_CARE_PROVIDER_SITE_OTHER): Payer: Medicaid Other

## 2020-01-04 ENCOUNTER — Ambulatory Visit (INDEPENDENT_AMBULATORY_CARE_PROVIDER_SITE_OTHER): Payer: Medicaid Other | Admitting: Obstetrics and Gynecology

## 2020-01-04 ENCOUNTER — Other Ambulatory Visit: Payer: Self-pay | Admitting: Women's Health

## 2020-01-04 ENCOUNTER — Other Ambulatory Visit: Payer: Self-pay

## 2020-01-04 ENCOUNTER — Telehealth: Payer: Self-pay | Admitting: Obstetrics and Gynecology

## 2020-01-04 VITALS — BP 135/80 | HR 91 | Wt 134.2 lb

## 2020-01-04 DIAGNOSIS — Z9189 Other specified personal risk factors, not elsewhere classified: Secondary | ICD-10-CM

## 2020-01-04 DIAGNOSIS — Z3482 Encounter for supervision of other normal pregnancy, second trimester: Secondary | ICD-10-CM

## 2020-01-04 DIAGNOSIS — F418 Other specified anxiety disorders: Secondary | ICD-10-CM | POA: Diagnosis not present

## 2020-01-04 DIAGNOSIS — O365931 Maternal care for other known or suspected poor fetal growth, third trimester, fetus 1: Secondary | ICD-10-CM

## 2020-01-04 DIAGNOSIS — O99343 Other mental disorders complicating pregnancy, third trimester: Secondary | ICD-10-CM

## 2020-01-04 DIAGNOSIS — IMO0002 Reserved for concepts with insufficient information to code with codable children: Secondary | ICD-10-CM

## 2020-01-04 DIAGNOSIS — Z3A30 30 weeks gestation of pregnancy: Secondary | ICD-10-CM

## 2020-01-04 DIAGNOSIS — Z331 Pregnant state, incidental: Secondary | ICD-10-CM

## 2020-01-04 DIAGNOSIS — Z1389 Encounter for screening for other disorder: Secondary | ICD-10-CM

## 2020-01-04 LAB — POCT URINALYSIS DIPSTICK OB
Blood, UA: NEGATIVE
Glucose, UA: NEGATIVE
Ketones, UA: NEGATIVE
Leukocytes, UA: NEGATIVE
Nitrite, UA: NEGATIVE
POC,PROTEIN,UA: NEGATIVE

## 2020-01-04 MED ORDER — BETAMETHASONE SOD PHOS & ACET 6 (3-3) MG/ML IJ SUSP
12.0000 mg | INTRAMUSCULAR | Status: AC
  Administered 2020-01-04 – 2020-01-05 (×2): 12 mg via INTRAMUSCULAR

## 2020-01-04 NOTE — Progress Notes (Signed)
[place] 30 wks,cephalic,BPP 8/8,CX 3.5  Cm,posterior placenta gr 0,afi 15 cm,fhr 140 bpm,RI .71,.73,.77,.74,.78=94%,S/D 4.2=97% with EDF,EFW 1219 g 5%

## 2020-01-04 NOTE — Progress Notes (Addendum)
Patient ID: Paula Riley, female   DOB: [birth year], 39 y.o.   MRN: [id number]    [facility] PREGNANCY VISIT Patient name: Paula Riley MRN [id number]  Date of birth: 02/05/1981   Chief Complaint:   Routine Prenatal Visit  History of Present Illness:   Paula Riley is a 39 y.o. [id number] female at [redacted]w[redacted]d with an Estimated Date of Delivery: 03/14/20 being seen today for ongoing management of a high-risk pregnancy complicated by supraventricular tachycardia, on Metoprolol, hasn't switched to Labetalol at present. Pt made aware that switch has been approved by cardiologist.   She notes that the tachycardia has not bothered her much during the pregnancy. She continues to take her iron supplementation as prescribed.   She is having some issues with anxiety. She notes that she has a history with this and she does have anxiety attacks. She notes that she is safe at home. She finds that her anxiety mostly comes from medical issues, regarding sudden losses of family members. She lost her mom at 47 years of age due to cancer; her grandmother passed within the same week.  Pt became tearful with discussion of FGR and Elevated dopplers. EDF present. Depression screen [facility] 2/9 12/14/2019 09/01/2019  Decreased Interest 2 0  Down, Depressed, Hopeless 1 0  PHQ - 2 Score 3 0  Altered sleeping 3 0  Tired, decreased energy 1 0  Change in appetite 1 2  Feeling bad or failure about yourself  0 -  Trouble concentrating 0 -  Moving slowly or fidgety/restless 0 -  Suicidal thoughts 0 -  PHQ-9 Score 8 2  Difficult doing work/chores Somewhat difficult -   Today she reports no complaints. Contractions: Not present. Vag. Bleeding: None.  Movement: Present. denies leaking of fluid.  Review of Systems:   Pertinent items are noted in HPI Denies abnormal vaginal discharge w/ itching/odor/irritation, headaches, visual changes, shortness of breath, chest pain, abdominal pain, severe nausea/vomiting, or problems with  urination or bowel movements unless otherwise stated above.  01/04/2020 [place] 30 wks, cephalic, BPP 8/8, CX 3.5 cm, posterior placenta gr 0, afi 15 cm, fhr 140 bpm, RI .71,.73,.77,.74,.78=94%, S/D 4.2=97% with EDF, EFW 1219 g 5%  Pertinent History Reviewed:  Reviewed past medical,surgical, social, obstetrical and family history.  Reviewed problem list, medications and allergies.  Physical Assessment:   Vitals:   01/04/20 1056  BP: 135/80  Pulse: 91  Weight: 134 lb 3.2 oz (60.9 kg)  Body mass index is 22.33 kg/m.        Physical Examination:   General appearance: Well appearing, and in no distress  Mental status: Alert, oriented to person, place, and time  Skin: Warm & dry  Cardiovascular: Normal heart rate noted  Respiratory: Normal respiratory effort, no distress  Abdomen: Soft, gravid, nontender   31cm  Pelvic: Cervical exam deferred         Extremities: Edema: Trace  Fetal Status:     Movement: Present    Chaperone: Data processing manager    Results for orders placed or performed in visit on 01/04/20 (from the past 24 hour(s))  POC Urinalysis Dipstick OB   Collection Time: 01/04/20 10:54 AM  Result Value Ref Range   Color, UA     Clarity, UA     Glucose, UA Negative Negative   Bilirubin, UA     Ketones, UA n    Spec Grav, UA     Blood, UA n    pH, UA     POC,PROTEIN,UA Negative Negative,  Trace, Small (1+), Moderate (2+), Large (3+), 4+   Urobilinogen, UA     Nitrite, UA n    Leukocytes, UA Negative Negative   Appearance     Odor      Assessment & Plan:  1) high risk pregnancy [id number] at [redacted]w[redacted]d with an Estimated Date of Delivery: 03/14/20 new dx of FGR with symmetric FGR, less than 5%ile, with EDF noted, dopplers at RI 94%, S/D at 97% will begin biweekly testing with weekly dopplers with NST next week,  Biweekly testing  after 32 wk,  BMZ administered today and tomorrow.  2) Recommended looking into group grief counseling to help with anxiety around sudden loss of family  members.   3) Birth control options discussed: Reviewed all forms of birth control options available including abstinence; over the counter/barrier methods; depo, nuva ring, Nexplanon, IUD, OCP and patch.   Risks/benefits/side effects of each discussed. Questions were answered.  Pt chooses IUD.   To be placed after delivery   4) Check hemoglobin at next visit for anemia.    Meds: No orders of the defined types were placed in this encounter.  Labs/procedures today: BPP with dopplers.EFW  Plan:  Continue routine obstetrical care with weekly NST and [place] Dopper Next visit: prefers in person    Reviewed: Preterm labor symptoms and general obstetric precautions including  Kick counts,  not limited to vaginal bleeding, contractions, leaking of fluid and fetal movement were reviewed in detail with the patient.  All questions were answered.  home bp cuff. bp weekly, let us know if >140/90.   Follow-up:  U/s /dopplers not NST 1 wk. Orders Placed This Encounter  Procedures  . POC Urinalysis Dipstick OB    By signing my name below, I, [employer], attest that this documentation has been prepared under the direction and in the presence of Jonnie Kind, MD. Electronically Signed: [place]. 01/04/20. 11:47 AM.  I personally performed the services described in this documentation, which was SCRIBED in my presence. The recorded information has been reviewed and considered accurate. It has been edited as necessary during review. Jonnie Kind, MD

## 2020-01-04 NOTE — Telephone Encounter (Signed)
I spoke with patient to schedule her next appointment and patient states that she for got to ask Dr. Emelda Fear a question. She would like to know if with everything that is going on if she should still be working? Please contact pt.

## 2020-01-05 ENCOUNTER — Ambulatory Visit (INDEPENDENT_AMBULATORY_CARE_PROVIDER_SITE_OTHER): Payer: Medicaid Other | Admitting: *Deleted

## 2020-01-05 ENCOUNTER — Encounter: Payer: Self-pay | Admitting: Obstetrics and Gynecology

## 2020-01-05 ENCOUNTER — Other Ambulatory Visit: Payer: Self-pay

## 2020-01-05 VITALS — BP 129/80 | HR 116

## 2020-01-05 DIAGNOSIS — O09293 Supervision of pregnancy with other poor reproductive or obstetric history, third trimester: Secondary | ICD-10-CM

## 2020-01-05 DIAGNOSIS — Z9189 Other specified personal risk factors, not elsewhere classified: Secondary | ICD-10-CM

## 2020-01-05 DIAGNOSIS — Z3A30 30 weeks gestation of pregnancy: Secondary | ICD-10-CM

## 2020-01-05 NOTE — Progress Notes (Signed)
   NURSE VISIT- INJECTION  SUBJECTIVE:  Paula Riley is a 39 y.o. [phone] female here for a Betamethasone for per provider order. She is [redacted]w[redacted]d pregnant.   OBJECTIVE:  LMP 06/08/2019 (Exact Date)   Appears well, in no apparent distress  Injection administered in: Left upper quad. gluteus  No orders of the defined types were placed in this encounter.   ASSESSMENT: Pregnancy [redacted]w[redacted]d Betamethasone for per provider order PLAN: Follow-up: as scheduled   Annamarie Dawley  01/05/2020 3:32 PM

## 2020-01-09 ENCOUNTER — Encounter: Payer: Self-pay | Admitting: Women's Health

## 2020-01-09 ENCOUNTER — Other Ambulatory Visit: Payer: Self-pay | Admitting: Obstetrics and Gynecology

## 2020-01-09 DIAGNOSIS — O365990 Maternal care for other known or suspected poor fetal growth, unspecified trimester, not applicable or unspecified: Secondary | ICD-10-CM | POA: Insufficient documentation

## 2020-01-09 DIAGNOSIS — IMO0002 Reserved for concepts with insufficient information to code with codable children: Secondary | ICD-10-CM

## 2020-01-10 ENCOUNTER — Ambulatory Visit (INDEPENDENT_AMBULATORY_CARE_PROVIDER_SITE_OTHER): Payer: Medicaid Other | Admitting: Obstetrics & Gynecology

## 2020-01-10 ENCOUNTER — Ambulatory Visit (INDEPENDENT_AMBULATORY_CARE_PROVIDER_SITE_OTHER): Payer: Medicaid Other

## 2020-01-10 ENCOUNTER — Encounter: Payer: Self-pay | Admitting: Obstetrics & Gynecology

## 2020-01-10 ENCOUNTER — Other Ambulatory Visit: Payer: Self-pay

## 2020-01-10 VITALS — BP 134/84 | HR 96 | Wt 135.0 lb

## 2020-01-10 DIAGNOSIS — Z3A30 30 weeks gestation of pregnancy: Secondary | ICD-10-CM | POA: Diagnosis not present

## 2020-01-10 DIAGNOSIS — O09523 Supervision of elderly multigravida, third trimester: Secondary | ICD-10-CM

## 2020-01-10 DIAGNOSIS — IMO0002 Reserved for concepts with insufficient information to code with codable children: Secondary | ICD-10-CM

## 2020-01-10 DIAGNOSIS — O0990 Supervision of high risk pregnancy, unspecified, unspecified trimester: Secondary | ICD-10-CM

## 2020-01-10 DIAGNOSIS — O365930 Maternal care for other known or suspected poor fetal growth, third trimester, not applicable or unspecified: Secondary | ICD-10-CM

## 2020-01-10 DIAGNOSIS — Z331 Pregnant state, incidental: Secondary | ICD-10-CM

## 2020-01-10 DIAGNOSIS — Z1389 Encounter for screening for other disorder: Secondary | ICD-10-CM

## 2020-01-10 LAB — POCT URINALYSIS DIPSTICK OB
Blood, UA: NEGATIVE
Glucose, UA: NEGATIVE
Ketones, UA: NEGATIVE
Leukocytes, UA: NEGATIVE
Nitrite, UA: NEGATIVE
POC,PROTEIN,UA: NEGATIVE

## 2020-01-10 NOTE — Progress Notes (Signed)
[place] 30+6 wks,cephalic,afi 11.3 cm,BPP 8/8,FHR 152 bpm,posterior placenta gr 1,RI .76,.63,.70,.68=81%

## 2020-01-10 NOTE — Progress Notes (Signed)
HIGH-RISK PREGNANCY VISIT Patient name: Paula Riley MRN [id number]  Date of birth: 1981/06/18 Chief Complaint:   High Risk Gestation ([place] today)  History of Present Illness:   Paula Riley is a 39 y.o. [phone] female at [redacted]w[redacted]d with an Estimated Date of Delivery: 03/14/20 being seen today for ongoing management of a high-risk pregnancy complicated by FGR.  Today she reports no complaints.  Depression screen [facility] 2/9 12/14/2019 09/01/2019  Decreased Interest 2 0  Down, Depressed, Hopeless 1 0  PHQ - 2 Score 3 0  Altered sleeping 3 0  Tired, decreased energy 1 0  Change in appetite 1 2  Feeling bad or failure about yourself  0 -  Trouble concentrating 0 -  Moving slowly or fidgety/restless 0 -  Suicidal thoughts 0 -  PHQ-9 Score 8 2  Difficult doing work/chores Somewhat difficult -    Contractions: Not present.  .  Movement: Present. denies leaking of fluid.  Review of Systems:   Pertinent items are noted in HPI Denies abnormal vaginal discharge w/ itching/odor/irritation, headaches, visual changes, shortness of breath, chest pain, abdominal pain, severe nausea/vomiting, or problems with urination or bowel movements unless otherwise stated above. Pertinent History Reviewed:  Reviewed past medical,surgical, social, obstetrical and family history.  Reviewed problem list, medications and allergies. Physical Assessment:   Vitals:   01/10/20 1100  BP: 134/84  Pulse: 96  Weight: 135 lb (61.2 kg)  Body mass index is 22.47 kg/m.           Physical Examination:   General appearance: alert, well appearing, and in no distress  Mental status: alert, oriented to person, place, and time  Skin: warm & dry   Extremities: Edema: None    Cardiovascular: normal heart rate noted  Respiratory: normal respiratory effort, no distress  Abdomen: gravid, soft, non-tender  Pelvic: Cervical exam deferred         Fetal Status:     Movement: Present    Fetal Surveillance Testing today: BPP 8/8   /Dopplers   Chaperone: n/a    Results for orders placed or performed in visit on 01/10/20 (from the past 24 hour(s))  POC Urinalysis Dipstick OB   Collection Time: 01/10/20 11:05 AM  Result Value Ref Range   Color, UA     Clarity, UA     Glucose, UA Negative Negative   Bilirubin, UA     Ketones, UA neg    Spec Grav, UA     Blood, UA neg    pH, UA     POC,PROTEIN,UA Negative Negative, Trace, Small (1+), Moderate (2+), Large (3+), 4+   Urobilinogen, UA     Nitrite, UA neg    Leukocytes, UA Negative Negative   Appearance     Odor      Assessment & Plan:  1) High-risk pregnancy [id number] at [redacted]w[redacted]d with an Estimated Date of Delivery: 03/14/20   2) FGR, stable, Dopplers 81% today with BPP 8/8    Meds: No orders of the defined types were placed in this encounter.   Labs/procedures today: sonogram BPP/Dopplers are normal  Treatment Plan:  BPP 1 week w/visit, then twice weekly fetal surveillance, IOL 37-39 weeks depending on clinical status  Reviewed: Preterm labor symptoms and general obstetric precautions including but not limited to vaginal bleeding, contractions, leaking of fluid and fetal movement were reviewed in detail with the patient.  All questions were answered. Has home bp cuff. Rx faxed to . Check bp weekly, let us know if >  140/90.   Follow-up: No follow-ups on file.  Orders Placed This Encounter  Procedures  . POC Urinalysis Dipstick OB   Mertie Clause Paula Riley  01/10/2020 11:21 AM

## 2020-01-16 ENCOUNTER — Ambulatory Visit (INDEPENDENT_AMBULATORY_CARE_PROVIDER_SITE_OTHER): Payer: Medicaid Other | Admitting: *Deleted

## 2020-01-16 ENCOUNTER — Other Ambulatory Visit: Payer: Self-pay

## 2020-01-16 VITALS — BP 122/75 | HR 93 | Wt 135.5 lb

## 2020-01-16 DIAGNOSIS — Z331 Pregnant state, incidental: Secondary | ICD-10-CM

## 2020-01-16 DIAGNOSIS — O365930 Maternal care for other known or suspected poor fetal growth, third trimester, not applicable or unspecified: Secondary | ICD-10-CM

## 2020-01-16 DIAGNOSIS — Z1389 Encounter for screening for other disorder: Secondary | ICD-10-CM

## 2020-01-16 DIAGNOSIS — Z3A31 31 weeks gestation of pregnancy: Secondary | ICD-10-CM | POA: Diagnosis not present

## 2020-01-16 DIAGNOSIS — O0990 Supervision of high risk pregnancy, unspecified, unspecified trimester: Secondary | ICD-10-CM

## 2020-01-16 LAB — POCT URINALYSIS DIPSTICK OB
Blood, UA: NEGATIVE
Glucose, UA: NEGATIVE
Ketones, UA: NEGATIVE
Leukocytes, UA: NEGATIVE
Nitrite, UA: NEGATIVE
POC,PROTEIN,UA: NEGATIVE

## 2020-01-16 NOTE — Progress Notes (Signed)
   NURSE VISIT- NST  SUBJECTIVE:  Paula Riley is a 39 y.o. [phone] female at [redacted]w[redacted]d, here for a NST for pregnancy complicated by FGR.  She reports active fetal movement, contractions: none, vaginal bleeding: none, membranes: intact.   OBJECTIVE:  BP 122/75   Pulse 93   Wt 135 lb 8 oz (61.5 kg)   LMP 06/08/2019 (Exact Date)   BMI 22.55 kg/m   Appears well, no apparent distress  Results for orders placed or performed in visit on 01/16/20 (from the past 24 hour(s))  POC Urinalysis Dipstick OB   Collection Time: 01/16/20 10:20 AM  Result Value Ref Range   Color, UA     Clarity, UA     Glucose, UA Negative Negative   Bilirubin, UA     Ketones, UA neg    Spec Grav, UA     Blood, UA neg    pH, UA     POC,PROTEIN,UA Negative Negative, Trace, Small (1+), Moderate (2+), Large (3+), 4+   Urobilinogen, UA     Nitrite, UA neg    Leukocytes, UA Negative Negative   Appearance     Odor      NST: FHR baseline 135 bpm, Variability: moderate, Accelerations:present, Decelerations:  Absent= Cat 1 /Reactive Toco: none   ASSESSMENT: [id number] at [redacted]w[redacted]d with FGR NST reactive  PLAN: EFM strip reviewed by Dr. Despina Hidden   Recommendations: keep next appointment as scheduled    Jobe Marker  01/16/2020 11:33 AM

## 2020-01-17 ENCOUNTER — Other Ambulatory Visit: Payer: Self-pay | Admitting: Obstetrics & Gynecology

## 2020-01-17 DIAGNOSIS — IMO0002 Reserved for concepts with insufficient information to code with codable children: Secondary | ICD-10-CM

## 2020-01-19 ENCOUNTER — Other Ambulatory Visit: Payer: Self-pay

## 2020-01-19 ENCOUNTER — Ambulatory Visit (INDEPENDENT_AMBULATORY_CARE_PROVIDER_SITE_OTHER): Payer: Medicaid Other

## 2020-01-19 ENCOUNTER — Encounter: Payer: Self-pay | Admitting: Obstetrics & Gynecology

## 2020-01-19 ENCOUNTER — Ambulatory Visit (INDEPENDENT_AMBULATORY_CARE_PROVIDER_SITE_OTHER): Payer: Medicaid Other | Admitting: Obstetrics & Gynecology

## 2020-01-19 VITALS — BP 136/83 | HR 86 | Wt 134.0 lb

## 2020-01-19 DIAGNOSIS — Z331 Pregnant state, incidental: Secondary | ICD-10-CM

## 2020-01-19 DIAGNOSIS — Z1389 Encounter for screening for other disorder: Secondary | ICD-10-CM

## 2020-01-19 DIAGNOSIS — Z3A32 32 weeks gestation of pregnancy: Secondary | ICD-10-CM

## 2020-01-19 DIAGNOSIS — O99013 Anemia complicating pregnancy, third trimester: Secondary | ICD-10-CM

## 2020-01-19 DIAGNOSIS — D649 Anemia, unspecified: Secondary | ICD-10-CM | POA: Diagnosis not present

## 2020-01-19 DIAGNOSIS — O365931 Maternal care for other known or suspected poor fetal growth, third trimester, fetus 1: Secondary | ICD-10-CM | POA: Diagnosis not present

## 2020-01-19 DIAGNOSIS — O09523 Supervision of elderly multigravida, third trimester: Secondary | ICD-10-CM

## 2020-01-19 DIAGNOSIS — IMO0002 Reserved for concepts with insufficient information to code with codable children: Secondary | ICD-10-CM

## 2020-01-19 DIAGNOSIS — O0990 Supervision of high risk pregnancy, unspecified, unspecified trimester: Secondary | ICD-10-CM

## 2020-01-19 LAB — POCT URINALYSIS DIPSTICK OB
Blood, UA: NEGATIVE
Glucose, UA: NEGATIVE
Ketones, UA: NEGATIVE
Leukocytes, UA: NEGATIVE
Nitrite, UA: NEGATIVE
POC,PROTEIN,UA: NEGATIVE

## 2020-01-19 LAB — POCT HEMOGLOBIN: Hemoglobin: 11.9 g/dL (ref 11–14.6)

## 2020-01-19 NOTE — Progress Notes (Signed)
[place] 32+1 wks,cephalic,BPP 8/8,posterior placenta gr 1,afi 11.5 cm,fhr 146 bpm,elevated UA dopplers with EDF,RI .83,.78,.77=98%

## 2020-01-19 NOTE — Progress Notes (Signed)
HIGH-RISK PREGNANCY VISIT Patient name: Paula Riley MRN [id number]  Date of birth: 1981/08/23 Chief Complaint:   Routine Prenatal Visit (BPP)  History of Present Illness:   Paula Riley is a 39 y.o. [phone] female at [redacted]w[redacted]d with an Estimated Date of Delivery: 03/14/20 being seen today for ongoing management of a high-risk pregnancy complicated by FGR w/elevated fetal Doppler ratios.  Today she reports some swelling and some spots/dizziness when moving suddenly.   That is related to her metoprolol casuing sluggish baroreceptor response in her vasoconstricition Depression screen [facility] 2/9 12/14/2019 09/01/2019  Decreased Interest 2 0  Down, Depressed, Hopeless 1 0  PHQ - 2 Score 3 0  Altered sleeping 3 0  Tired, decreased energy 1 0  Change in appetite 1 2  Feeling bad or failure about yourself  0 -  Trouble concentrating 0 -  Moving slowly or fidgety/restless 0 -  Suicidal thoughts 0 -  PHQ-9 Score 8 2  Difficult doing work/chores Somewhat difficult -    Contractions: Not present. Vag. Bleeding: None.  Movement: Present. denies leaking of fluid.  Review of Systems:   Pertinent items are noted in HPI Denies abnormal vaginal discharge w/ itching/odor/irritation, headaches, visual changes, shortness of breath, chest pain, abdominal pain, severe nausea/vomiting, or problems with urination or bowel movements unless otherwise stated above. Pertinent History Reviewed:  Reviewed past medical,surgical, social, obstetrical and family history.  Reviewed problem list, medications and allergies. Physical Assessment:   Vitals:   01/19/20 1033  BP: 136/83  Pulse: 86  Weight: 134 lb (60.8 kg)  Body mass index is 22.3 kg/m.           Physical Examination:   General appearance: alert, well appearing, and in no distress  Mental status: alert, oriented to person, place, and time  Skin: warm & dry   Extremities: Edema: Trace    Cardiovascular: normal heart rate noted  Respiratory: normal  respiratory effort, no distress  Abdomen: gravid, soft, non-tender  Pelvic: Cervical exam deferred         Fetal Status: Fetal Heart Rate (bpm): 135   Movement: Present    Fetal Surveillance Testing today: BPP 8/8 with fetal doppler ratios 98%   Chaperone: n/a    Results for orders placed or performed in visit on 01/19/20 (from the past 24 hour(s))  POC Urinalysis Dipstick OB   Collection Time: 01/19/20 10:38 AM  Result Value Ref Range   Color, UA     Clarity, UA     Glucose, UA Negative Negative   Bilirubin, UA     Ketones, UA neg    Spec Grav, UA     Blood, UA neg    pH, UA     POC,PROTEIN,UA Negative Negative, Trace, Small (1+), Moderate (2+), Large (3+), 4+   Urobilinogen, UA     Nitrite, UA neg    Leukocytes, UA Negative Negative   Appearance     Odor    POCT hemoglobin   Collection Time: 01/19/20 10:52 AM  Result Value Ref Range   Hemoglobin 11.9 11 - 14.6 g/dL    Assessment & Plan:  1) High-risk pregnancy [id number] at [redacted]w[redacted]d with an Estimated Date of Delivery: 03/14/20   2) FGR w/elevated fetal Doppler ratios, stable, has received her BMZ x 2, twice weekly testing    Meds: No orders of the defined types were placed in this encounter.   Labs/procedures today: sonogram  Treatment Plan:  Twice weekly testing BPP + Dopplers alternating with NST.  Plan delivery 37 weeksk or as clinically indictaed  Reviewed: Preterm labor symptoms and general obstetric precautions including but not limited to vaginal bleeding, contractions, leaking of fluid and fetal movement were reviewed in detail with the patient.  All questions were answered. Has home bp cuff. Rx faxed to . Check bp weekly, let us know if >140/90.   Follow-up: Return in about 4 days (around 01/23/2020) for NST, Nurse only.  Orders Placed This Encounter  Procedures  . POC Urinalysis Dipstick OB  . POCT hemoglobin   Lazaro Arms  01/19/2020 10:55 AM

## 2020-01-23 ENCOUNTER — Other Ambulatory Visit: Payer: Self-pay

## 2020-01-23 ENCOUNTER — Ambulatory Visit (INDEPENDENT_AMBULATORY_CARE_PROVIDER_SITE_OTHER): Payer: Medicaid Other | Admitting: *Deleted

## 2020-01-23 VITALS — BP 120/81 | HR 90 | Wt 136.0 lb

## 2020-01-23 DIAGNOSIS — O0990 Supervision of high risk pregnancy, unspecified, unspecified trimester: Secondary | ICD-10-CM

## 2020-01-23 DIAGNOSIS — O365930 Maternal care for other known or suspected poor fetal growth, third trimester, not applicable or unspecified: Secondary | ICD-10-CM

## 2020-01-23 DIAGNOSIS — Z1389 Encounter for screening for other disorder: Secondary | ICD-10-CM

## 2020-01-23 DIAGNOSIS — Z3A32 32 weeks gestation of pregnancy: Secondary | ICD-10-CM | POA: Diagnosis not present

## 2020-01-23 DIAGNOSIS — O365990 Maternal care for other known or suspected poor fetal growth, unspecified trimester, not applicable or unspecified: Secondary | ICD-10-CM

## 2020-01-23 DIAGNOSIS — Z331 Pregnant state, incidental: Secondary | ICD-10-CM

## 2020-01-23 DIAGNOSIS — O288 Other abnormal findings on antenatal screening of mother: Secondary | ICD-10-CM

## 2020-01-23 LAB — POCT URINALYSIS DIPSTICK OB
Blood, UA: NEGATIVE
Glucose, UA: NEGATIVE
Ketones, UA: NEGATIVE
Leukocytes, UA: NEGATIVE
Nitrite, UA: NEGATIVE
POC,PROTEIN,UA: NEGATIVE

## 2020-01-23 NOTE — Progress Notes (Signed)
   NURSE VISIT- NST  SUBJECTIVE:  Paula Riley is a 39 y.o. [phone] female at [redacted]w[redacted]d, here for a NST for pregnancy complicated by FGR.  She reports active fetal movement, contractions: none, vaginal bleeding: none, membranes: intact.   OBJECTIVE:  BP 120/81   Pulse 90   Wt 136 lb (61.7 kg)   LMP 06/08/2019 (Exact Date)   BMI 22.63 kg/m   Appears well, no apparent distress  Results for orders placed or performed in visit on 01/23/20 (from the past 24 hour(s))  POC Urinalysis Dipstick OB   Collection Time: 01/23/20 10:19 AM  Result Value Ref Range   Color, UA     Clarity, UA     Glucose, UA Negative Negative   Bilirubin, UA     Ketones, UA neg    Spec Grav, UA     Blood, UA neg    pH, UA     POC,PROTEIN,UA Negative Negative, Trace, Small (1+), Moderate (2+), Large (3+), 4+   Urobilinogen, UA     Nitrite, UA neg    Leukocytes, UA Negative Negative   Appearance     Odor      NST: FHR baseline 135 bpm, Variability: moderate, Accelerations:present, Decelerations:  Absent= Cat 1/Reactive Toco: irregular   ASSESSMENT: [id number] at [redacted]w[redacted]d with FGR NST reactive  PLAN: EFM strip reviewed by Joellyn Haff, CNM, [facility]   Recommendations: keep next appointment as scheduled    Jobe Marker  01/23/2020 11:45 AM

## 2020-01-25 ENCOUNTER — Other Ambulatory Visit: Payer: Self-pay | Admitting: Obstetrics & Gynecology

## 2020-01-25 DIAGNOSIS — IMO0002 Reserved for concepts with insufficient information to code with codable children: Secondary | ICD-10-CM

## 2020-01-26 ENCOUNTER — Encounter: Payer: Self-pay | Admitting: Advanced Practice Midwife

## 2020-01-26 ENCOUNTER — Ambulatory Visit (INDEPENDENT_AMBULATORY_CARE_PROVIDER_SITE_OTHER): Payer: Medicaid Other | Admitting: Advanced Practice Midwife

## 2020-01-26 ENCOUNTER — Ambulatory Visit (INDEPENDENT_AMBULATORY_CARE_PROVIDER_SITE_OTHER): Payer: Medicaid Other

## 2020-01-26 ENCOUNTER — Other Ambulatory Visit: Payer: Self-pay

## 2020-01-26 VITALS — BP 129/81 | HR 87 | Wt 139.0 lb

## 2020-01-26 DIAGNOSIS — O09523 Supervision of elderly multigravida, third trimester: Secondary | ICD-10-CM

## 2020-01-26 DIAGNOSIS — Z331 Pregnant state, incidental: Secondary | ICD-10-CM

## 2020-01-26 DIAGNOSIS — Z3A33 33 weeks gestation of pregnancy: Secondary | ICD-10-CM

## 2020-01-26 DIAGNOSIS — O0993 Supervision of high risk pregnancy, unspecified, third trimester: Secondary | ICD-10-CM

## 2020-01-26 DIAGNOSIS — Z1389 Encounter for screening for other disorder: Secondary | ICD-10-CM

## 2020-01-26 DIAGNOSIS — O365931 Maternal care for other known or suspected poor fetal growth, third trimester, fetus 1: Secondary | ICD-10-CM | POA: Diagnosis not present

## 2020-01-26 DIAGNOSIS — IMO0002 Reserved for concepts with insufficient information to code with codable children: Secondary | ICD-10-CM

## 2020-01-26 DIAGNOSIS — O365930 Maternal care for other known or suspected poor fetal growth, third trimester, not applicable or unspecified: Secondary | ICD-10-CM

## 2020-01-26 LAB — POCT URINALYSIS DIPSTICK OB
Blood, UA: NEGATIVE
Glucose, UA: NEGATIVE
Ketones, UA: NEGATIVE
Leukocytes, UA: NEGATIVE
Nitrite, UA: NEGATIVE
POC,PROTEIN,UA: NEGATIVE

## 2020-01-26 NOTE — Progress Notes (Signed)
HIGH-RISK PREGNANCY VISIT Patient name: Paula Riley MRN [id number]  Date of birth: [birth year] Chief Complaint:   Routine Prenatal Visit (u/s)  History of Present Illness:   Paula Riley is a 39 y.o. [phone] female at [redacted]w[redacted]d with an Estimated Date of Delivery: 03/14/20 being seen today for ongoing management of a high-risk pregnancy complicated by fetal growth restriction 2.8% Today she reports no complaints. Contractions: Not present. Vag. Bleeding: None.  Movement: Present. denies leaking of fluid.  Review of Systems:   Pertinent items are noted in HPI Denies abnormal vaginal discharge w/ itching/odor/irritation, headaches, visual changes, shortness of breath, chest pain, abdominal pain, severe nausea/vomiting, or problems with urination or bowel movements unless otherwise stated above. Pertinent History Reviewed:  Reviewed past medical,surgical, social, obstetrical and family history.  Reviewed problem list, medications and allergies. Physical Assessment:   Vitals:   01/26/20 1205  BP: 129/81  Pulse: 87  Weight: 139 lb (63 kg)  Body mass index is 23.13 kg/m.           Physical Examination:   General appearance: alert, well appearing, and in no distress and visibly anxious--states that it is about the baby being small. Offered to help talk things out, pt declined.   Mental status: alert, oriented to person, place, and time  Skin: warm & dry   Extremities:      Cardiovascular: normal heart rate noted  Respiratory: normal respiratory effort, no distress  Abdomen: gravid, soft, non-tender  Pelvic: Cervical exam deferred         Fetal Status:     Movement: Present    Fetal Surveillance Testing today: [place] 83+4 wks,cephalic,BPP 1/9,QQI 297 BPM,posterior placenta gr 2,afi 11 cm,RI .72,.77,.70,.66,.76=90%,S/D 3.46=90%,intermittent elevated UAD with EDF,EFW 1638 g 2.8%   Results for orders placed or performed in visit on 01/26/20 (from the past 24 hour(s))  POC Urinalysis Dipstick OB     Collection Time: 01/26/20 12:08 PM  Result Value Ref Range   Color, UA     Clarity, UA     Glucose, UA Negative Negative   Bilirubin, UA     Ketones, UA neg    Spec Grav, UA     Blood, UA neg    pH, UA     POC,PROTEIN,UA Negative Negative, Trace, Small (1+), Moderate (2+), Large (3+), 4+   Urobilinogen, UA     Nitrite, UA neg    Leukocytes, UA Negative Negative   Appearance     Odor      Assessment & Plan:  1) High-risk pregnancy [id number] at [redacted]w[redacted]d with an Estimated Date of Delivery: 03/14/20   2) FGR, stable Treatment plan  Twice weekly testing; IOL 37 weeks or as clinically indicated  Meds: No orders of the defined types were placed in this encounter.   Labs/procedures today: none   Reviewed: Preterm labor symptoms and general obstetric precautions including but not limited to vaginal bleeding, contractions, leaking of fluid and fetal movement were reviewed in detail with the patient.  All questions were answered. Has home bp cuff. Check bp weekly, let us know if >140/90.   Follow-up: Return for mondays NST and THursd USw/HROB visit with [facility].  Future Appointments  Date Time Provider [place]  01/30/2020 10:50 AM [facility] NURSE [facility]  01/31/2020  3:30 PM [facility] [place] [facility] None  01/31/2020  4:10 PM Florian Buff, MD [facility]  02/06/2020 10:30 AM [facility] NURSE [facility]  02/09/2020 11:00 AM [facility] [place] [facility] None  02/09/2020 11:50 AM Cheral Marker, CNM [facility]  02/14/2020 10:50 AM [facility] NURSE [facility]  02/16/2020 11:00 AM [facility] [place] [facility] None  02/16/2020 11:50 AM Lazaro Arms, MD [facility]  02/20/2020 10:50 AM [facility] NURSE [facility]  02/27/2020 10:50 AM [facility] NURSE [facility]  03/01/2020 11:00 AM [facility] [place] [facility] None  03/01/2020 11:50 AM Tilda Burrow, MD [facility]  03/05/2020 10:50 AM [facility] NURSE [facility]  03/08/2020 11:00 AM [facility] [place]  [facility] None  03/08/2020 11:50 AM Eure, Amaryllis Dyke, MD [facility]    Orders Placed This Encounter  Procedures  . POC Urinalysis Dipstick OB   Jacklyn Shell DNP, CNM 01/26/2020 2:28 PM

## 2020-01-26 NOTE — Progress Notes (Signed)
[place] 33+1 wks,cephalic,BPP 8/8,FHR 126 BPM,posterior placenta gr 2,afi 11 cm,RI .72,.77,.70,.66,.76=90%,S/D 3.46=90%,intermittent elevated UAD with EDF,EFW 1638 g 2.8%

## 2020-01-26 NOTE — Patient Instructions (Signed)
Paula Riley, I greatly value your feedback.  If you receive a survey following your visit with [place] today, we appreciate you taking the time to fill it out.  Thanks, Cathie Beams, DNP, CNM  [facility] HAS MOVED!!! It is now [facility] at [facility] ([address] [place], [place] [address]) Entrance located off of [address] Free 24/7 valet parking   Go to [email or web address] to register for FREE online childbirth classes    Call the office [phone]) or go to [facility] if:  You begin to have strong, frequent contractions  Your water breaks.  Sometimes it is a big gush of fluid, sometimes it is just a trickle that keeps getting your panties wet or running down your legs  You have vaginal bleeding.  It is normal to have a small amount of spotting if your cervix was checked.   You don't feel your baby moving like normal.  If you don't, get you something to eat and drink and lay down and focus on feeling your baby move.  You should feel at least 10 movements in 2 hours.  If you don't, you should call the office or go to [facility].   Home Blood Pressure Monitoring for Patients   Your provider has recommended that you check your blood pressure (BP) at least once a week at home. If you do not have a blood pressure cuff at home, one will be provided for you. Contact your provider if you have not received your monitor within 1 week.   Helpful Tips for Accurate Home Blood Pressure Checks  . Don't smoke, exercise, or drink caffeine 30 minutes before checking your BP . Use the restroom before checking your BP (a full bladder can raise your pressure) . Relax in a comfortable upright chair . Feet on the ground . Left arm resting comfortably on a flat surface at the level of your heart . Legs uncrossed . Back supported . Sit quietly and don't talk . Place the cuff on your bare arm . Adjust snuggly, so that only two fingertips can fit  between your skin and the top of the cuff . Check 2 readings separated by at least one minute . Keep a log of your BP readings . For a visual, please reference this diagram: http://ccnc.care/bpdiagram  Provider Name: [facility]     Phone: [phone]  Zone 1: ALL CLEAR  Continue to monitor your symptoms:  . BP reading is less than 140 (top number) or less than 90 (bottom number)  . No right upper stomach pain . No headaches or seeing spots . No feeling nauseated or throwing up . No swelling in face and hands  Zone 2: CAUTION Call your doctor's office for any of the following:  . BP reading is greater than 140 (top number) or greater than 90 (bottom number)  . Stomach pain under your ribs in the middle or right side . Headaches or seeing spots . Feeling nauseated or throwing up . Swelling in face and hands  Zone 3: EMERGENCY  Seek immediate medical care if you have any of the following:  . BP reading is greater than160 (top number) or greater than 110 (bottom number) . Severe headaches not improving with Tylenol . Serious difficulty catching your breath . Any worsening symptoms from Zone 2

## 2020-01-30 ENCOUNTER — Other Ambulatory Visit: Payer: Self-pay | Admitting: Advanced Practice Midwife

## 2020-01-30 ENCOUNTER — Ambulatory Visit (INDEPENDENT_AMBULATORY_CARE_PROVIDER_SITE_OTHER): Payer: Medicaid Other | Admitting: *Deleted

## 2020-01-30 VITALS — BP 140/79 | HR 108 | Wt 139.0 lb

## 2020-01-30 DIAGNOSIS — O365930 Maternal care for other known or suspected poor fetal growth, third trimester, not applicable or unspecified: Secondary | ICD-10-CM

## 2020-01-30 DIAGNOSIS — Z3A33 33 weeks gestation of pregnancy: Secondary | ICD-10-CM

## 2020-01-30 DIAGNOSIS — Z1389 Encounter for screening for other disorder: Secondary | ICD-10-CM | POA: Diagnosis not present

## 2020-01-30 DIAGNOSIS — O0990 Supervision of high risk pregnancy, unspecified, unspecified trimester: Secondary | ICD-10-CM

## 2020-01-30 DIAGNOSIS — O09523 Supervision of elderly multigravida, third trimester: Secondary | ICD-10-CM

## 2020-01-30 DIAGNOSIS — O288 Other abnormal findings on antenatal screening of mother: Secondary | ICD-10-CM | POA: Diagnosis not present

## 2020-01-30 DIAGNOSIS — IMO0002 Reserved for concepts with insufficient information to code with codable children: Secondary | ICD-10-CM

## 2020-01-30 DIAGNOSIS — Z331 Pregnant state, incidental: Secondary | ICD-10-CM

## 2020-01-30 LAB — POCT URINALYSIS DIPSTICK OB
Blood, UA: NEGATIVE
Glucose, UA: NEGATIVE
Ketones, UA: NEGATIVE
Leukocytes, UA: NEGATIVE
Nitrite, UA: NEGATIVE
POC,PROTEIN,UA: NEGATIVE

## 2020-01-30 NOTE — Progress Notes (Signed)
   NURSE VISIT- NST  SUBJECTIVE:  Paula Riley is a 39 y.o. [phone] female at [redacted]w[redacted]d, here for a NST for pregnancy complicated by FGR.  She reports active fetal movement, contractions: none, vaginal bleeding: none, membranes: intact.   OBJECTIVE:  BP 140/79   Pulse (!) 108   Wt 139 lb (63 kg)   LMP 06/08/2019 (Exact Date)   BMI 23.13 kg/m   Appears well, no apparent distress  Results for orders placed or performed in visit on 01/30/20 (from the past 24 hour(s))  POC Urinalysis Dipstick OB   Collection Time: 01/30/20 11:06 AM  Result Value Ref Range   Color, UA     Clarity, UA     Glucose, UA Negative Negative   Bilirubin, UA     Ketones, UA neg    Spec Grav, UA     Blood, UA neg    pH, UA     POC,PROTEIN,UA Negative Negative, Trace, Small (1+), Moderate (2+), Large (3+), 4+   Urobilinogen, UA     Nitrite, UA neg    Leukocytes, UA Negative Negative   Appearance     Odor      NST: FHR baseline 140 bpm, Variability: moderate, Accelerations:present, Decelerations:  Absent= Cat 1/Reactive Toco: none   ASSESSMENT: [id number] at [redacted]w[redacted]d with FGR NST reactive  PLAN: EFM strip reviewed by Dr. Despina Hidden   Recommendations: keep next appointment as scheduled    Jobe Marker  01/30/2020 4:49 PM

## 2020-01-31 ENCOUNTER — Other Ambulatory Visit: Payer: Self-pay

## 2020-01-31 ENCOUNTER — Encounter: Payer: Self-pay | Admitting: Obstetrics & Gynecology

## 2020-01-31 ENCOUNTER — Ambulatory Visit (INDEPENDENT_AMBULATORY_CARE_PROVIDER_SITE_OTHER): Payer: Medicaid Other

## 2020-01-31 ENCOUNTER — Ambulatory Visit (INDEPENDENT_AMBULATORY_CARE_PROVIDER_SITE_OTHER): Payer: Medicaid Other | Admitting: Obstetrics & Gynecology

## 2020-01-31 VITALS — BP 135/89 | HR 98 | Wt 137.0 lb

## 2020-01-31 DIAGNOSIS — IMO0002 Reserved for concepts with insufficient information to code with codable children: Secondary | ICD-10-CM

## 2020-01-31 DIAGNOSIS — Z3A33 33 weeks gestation of pregnancy: Secondary | ICD-10-CM | POA: Diagnosis not present

## 2020-01-31 DIAGNOSIS — Z331 Pregnant state, incidental: Secondary | ICD-10-CM

## 2020-01-31 DIAGNOSIS — O0990 Supervision of high risk pregnancy, unspecified, unspecified trimester: Secondary | ICD-10-CM

## 2020-01-31 DIAGNOSIS — O365930 Maternal care for other known or suspected poor fetal growth, third trimester, not applicable or unspecified: Secondary | ICD-10-CM

## 2020-01-31 DIAGNOSIS — O0993 Supervision of high risk pregnancy, unspecified, third trimester: Secondary | ICD-10-CM

## 2020-01-31 DIAGNOSIS — O09523 Supervision of elderly multigravida, third trimester: Secondary | ICD-10-CM

## 2020-01-31 DIAGNOSIS — Z1389 Encounter for screening for other disorder: Secondary | ICD-10-CM

## 2020-01-31 LAB — POCT URINALYSIS DIPSTICK OB
Blood, UA: NEGATIVE
Glucose, UA: NEGATIVE
Nitrite, UA: NEGATIVE
POC,PROTEIN,UA: NEGATIVE

## 2020-01-31 NOTE — Progress Notes (Signed)
HIGH-RISK PREGNANCY VISIT Patient name: Paula Riley MRN [id number]  Date of birth: 1981-06-03 Chief Complaint:   High Risk Gestation ([place] today)  History of Present Illness:   Paula Riley is a 39 y.o. [phone] female at [redacted]w[redacted]d with an Estimated Date of Delivery: 03/14/20 being seen today for ongoing management of a high-risk pregnancy complicated by FGR.  Today she reports no complaints.  Depression screen [facility] 2/9 12/14/2019 09/01/2019  Decreased Interest 2 0  Down, Depressed, Hopeless 1 0  PHQ - 2 Score 3 0  Altered sleeping 3 0  Tired, decreased energy 1 0  Change in appetite 1 2  Feeling bad or failure about yourself  0 -  Trouble concentrating 0 -  Moving slowly or fidgety/restless 0 -  Suicidal thoughts 0 -  PHQ-9 Score 8 2  Difficult doing work/chores Somewhat difficult -    Contractions: Not present. Vag. Bleeding: None.  Movement: Present. denies leaking of fluid.  Review of Systems:   Pertinent items are noted in HPI Denies abnormal vaginal discharge w/ itching/odor/irritation, headaches, visual changes, shortness of breath, chest pain, abdominal pain, severe nausea/vomiting, or problems with urination or bowel movements unless otherwise stated above. Pertinent History Reviewed:  Reviewed past medical,surgical, social, obstetrical and family history.  Reviewed problem list, medications and allergies. Physical Assessment:   Vitals:   01/31/20 1608  BP: 135/89  Pulse: 98  Weight: 137 lb (62.1 kg)  Body mass index is 22.8 kg/m.           Physical Examination:   General appearance: alert, well appearing, and in no distress  Mental status: alert, oriented to person, place, and time  Skin: warm & dry   Extremities: Edema: Trace    Cardiovascular: normal heart rate noted  Respiratory: normal respiratory effort, no distress  Abdomen: gravid, soft, non-tender  Pelvic: Cervical exam deferred         Fetal Status:     Movement: Present    Fetal Surveillance Testing  today: BPP 8/8 normal Dopplers   Chaperone: n/a    Results for orders placed or performed in visit on 01/31/20 (from the past 24 hour(s))  POC Urinalysis Dipstick OB   Collection Time: 01/31/20  4:09 PM  Result Value Ref Range   Color, UA     Clarity, UA     Glucose, UA Negative Negative   Bilirubin, UA     Ketones, UA trace    Spec Grav, UA     Blood, UA neg    pH, UA     POC,PROTEIN,UA Negative Negative, Trace, Small (1+), Moderate (2+), Large (3+), 4+   Urobilinogen, UA     Nitrite, UA neg    Leukocytes, UA Trace (A) Negative   Appearance     Odor      Assessment & Plan:  1) High-risk pregnancy [id number] at [redacted]w[redacted]d with an Estimated Date of Delivery: 03/14/20   2) FGR, stable, reassuring testing  3) SVT on metoprolol, stable  Meds: No orders of the defined types were placed in this encounter.   Labs/procedures today: sonogram  Treatment Plan:  Twice weekly testing, sonogram alternating with NST  Reviewed: Term labor symptoms and general obstetric precautions including but not limited to vaginal bleeding, contractions, leaking of fluid and fetal movement were reviewed in detail with the patient.  All questions were answered. Has home bp cuff. Rx faxed to . Check bp weekly, let us know if >140/90.   Follow-up: Return in about 6 days (  around 02/06/2020) for BPP/sono, HROB.  Orders Placed This Encounter  Procedures  . POC Urinalysis Dipstick OB   Lazaro Arms  01/31/2020 4:28 PM

## 2020-01-31 NOTE — Progress Notes (Signed)
[place] 33+6 wks,cephalic,BPP 8/8,posterior placenta gr 2,afi 13.7 cm,fhr 137 bpm,RI .65,.70,.68,.66=84%

## 2020-02-06 ENCOUNTER — Ambulatory Visit (INDEPENDENT_AMBULATORY_CARE_PROVIDER_SITE_OTHER): Payer: Medicaid Other | Admitting: *Deleted

## 2020-02-06 VITALS — BP 135/78 | HR 96 | Wt 138.0 lb

## 2020-02-06 DIAGNOSIS — Z1389 Encounter for screening for other disorder: Secondary | ICD-10-CM

## 2020-02-06 DIAGNOSIS — Z331 Pregnant state, incidental: Secondary | ICD-10-CM

## 2020-02-06 DIAGNOSIS — O365930 Maternal care for other known or suspected poor fetal growth, third trimester, not applicable or unspecified: Secondary | ICD-10-CM | POA: Diagnosis not present

## 2020-02-06 DIAGNOSIS — Z3A34 34 weeks gestation of pregnancy: Secondary | ICD-10-CM | POA: Diagnosis not present

## 2020-02-06 DIAGNOSIS — O0993 Supervision of high risk pregnancy, unspecified, third trimester: Secondary | ICD-10-CM | POA: Diagnosis not present

## 2020-02-06 DIAGNOSIS — O0990 Supervision of high risk pregnancy, unspecified, unspecified trimester: Secondary | ICD-10-CM

## 2020-02-06 LAB — POCT URINALYSIS DIPSTICK OB
Blood, UA: NEGATIVE
Glucose, UA: NEGATIVE
Ketones, UA: NEGATIVE
Leukocytes, UA: NEGATIVE
Nitrite, UA: NEGATIVE
POC,PROTEIN,UA: NEGATIVE

## 2020-02-06 NOTE — Progress Notes (Signed)
   NURSE VISIT- NST  SUBJECTIVE:  Paula Riley is a 39 y.o. [phone] female at [redacted]w[redacted]d, here for a NST for pregnancy complicated by FGR.  She reports active fetal movement, contractions: none, vaginal bleeding: none, membranes: intact.   OBJECTIVE:  BP 135/78   Pulse 96   Wt 138 lb (62.6 kg)   LMP 06/08/2019 (Exact Date)   BMI 22.96 kg/m   Appears well, no apparent distress  Results for orders placed or performed in visit on 02/06/20 (from the past 24 hour(s))  POC Urinalysis Dipstick OB   Collection Time: 02/06/20 10:48 AM  Result Value Ref Range   Color, UA     Clarity, UA     Glucose, UA Negative Negative   Bilirubin, UA     Ketones, UA neg    Spec Grav, UA     Blood, UA neg    pH, UA     POC,PROTEIN,UA Negative Negative, Trace, Small (1+), Moderate (2+), Large (3+), 4+   Urobilinogen, UA     Nitrite, UA neg    Leukocytes, UA Negative Negative   Appearance     Odor      NST: FHR baseline 135 bpm, Variability: moderate, Accelerations:present, Decelerations:  Absent= Cat 1/Reactive Toco: none   ASSESSMENT: [id number] at [redacted]w[redacted]d with FGR NST reactive  PLAN: EFM strip reviewed by Dr. Despina Hidden   Recommendations: keep next appointment as scheduled    Jobe Marker  02/06/2020 12:30 PM

## 2020-02-08 ENCOUNTER — Other Ambulatory Visit: Payer: Self-pay | Admitting: Obstetrics & Gynecology

## 2020-02-08 DIAGNOSIS — IMO0002 Reserved for concepts with insufficient information to code with codable children: Secondary | ICD-10-CM

## 2020-02-09 ENCOUNTER — Encounter: Payer: Self-pay | Admitting: Women's Health

## 2020-02-09 ENCOUNTER — Ambulatory Visit (INDEPENDENT_AMBULATORY_CARE_PROVIDER_SITE_OTHER): Payer: Medicaid Other

## 2020-02-09 ENCOUNTER — Ambulatory Visit (INDEPENDENT_AMBULATORY_CARE_PROVIDER_SITE_OTHER): Payer: Medicaid Other | Admitting: Women's Health

## 2020-02-09 VITALS — BP 129/88 | HR 112 | Wt 139.2 lb

## 2020-02-09 DIAGNOSIS — IMO0002 Reserved for concepts with insufficient information to code with codable children: Secondary | ICD-10-CM

## 2020-02-09 DIAGNOSIS — Z3A35 35 weeks gestation of pregnancy: Secondary | ICD-10-CM

## 2020-02-09 DIAGNOSIS — O365930 Maternal care for other known or suspected poor fetal growth, third trimester, not applicable or unspecified: Secondary | ICD-10-CM

## 2020-02-09 DIAGNOSIS — Z362 Encounter for other antenatal screening follow-up: Secondary | ICD-10-CM | POA: Diagnosis not present

## 2020-02-09 DIAGNOSIS — Z1389 Encounter for screening for other disorder: Secondary | ICD-10-CM

## 2020-02-09 DIAGNOSIS — Z331 Pregnant state, incidental: Secondary | ICD-10-CM

## 2020-02-09 DIAGNOSIS — O0990 Supervision of high risk pregnancy, unspecified, unspecified trimester: Secondary | ICD-10-CM

## 2020-02-09 DIAGNOSIS — O0993 Supervision of high risk pregnancy, unspecified, third trimester: Secondary | ICD-10-CM

## 2020-02-09 LAB — POCT URINALYSIS DIPSTICK OB
Blood, UA: NEGATIVE
Glucose, UA: NEGATIVE
Leukocytes, UA: NEGATIVE
Nitrite, UA: NEGATIVE

## 2020-02-09 NOTE — Progress Notes (Signed)
[place] 35+1 wks,cephalic,posterior placenta gr 3,AFI 11.5 cm,fhr 133 bpm,BPP 8/8,RI .74,.75,.69,.73,.67=90% (intermittent elevated UAD)

## 2020-02-09 NOTE — Patient Instructions (Signed)
Paula Riley, I greatly value your feedback.  If you receive a survey following your visit with [place] today, we appreciate you taking the time to fill it out.  Thanks, Joellyn Haff, CNM, WHNP-BC  [facility] ([address] [place], [place] [address]) Entrance C, located off of [address] valet parking   Go to [email or web address] to register for FREE online childbirth classes    Call the office [phone]) or go to [facility] if:  You begin to have strong, frequent contractions  Your water breaks.  Sometimes it is a big gush of fluid, sometimes it is just a trickle that keeps getting your panties wet or running down your legs  You have vaginal bleeding.  It is normal to have a small amount of spotting if your cervix was checked.   You don't feel your baby moving like normal.  If you don't, get you something to eat and drink and lay down and focus on feeling your baby move.  You should feel at least 10 movements in 2 hours.  If you don't, you should call the office or go to [facility].   Call the office [phone]) or go to [facility] for these signs of pre-eclampsia:  Severe headache that does not go away with Tylenol  Visual changes- seeing spots, double, blurred vision  Pain under your right breast or upper abdomen that does not go away with Tums or heartburn medicine  Nausea and/or vomiting  Severe swelling in your hands, feet, and face    Home Blood Pressure Monitoring for Patients   Your provider has recommended that you check your blood pressure (BP) at least once a week at home. If you do not have a blood pressure cuff at home, one will be provided for you. Contact your provider if you have not received your monitor within 1 week.   Helpful Tips for Accurate Home Blood Pressure Checks  . Don't smoke, exercise, or drink caffeine 30 minutes before checking your BP . Use the restroom before checking your BP (a full bladder  can raise your pressure) . Relax in a comfortable upright chair . Feet on the ground . Left arm resting comfortably on a flat surface at the level of your heart . Legs uncrossed . Back supported . Sit quietly and don't talk . Place the cuff on your bare arm . Adjust snuggly, so that only two fingertips can fit between your skin and the top of the cuff . Check 2 readings separated by at least one minute . Keep a log of your BP readings . For a visual, please reference this diagram: http://ccnc.care/bpdiagram  Provider Name: [facility]     Phone: [phone]  Zone 1: ALL CLEAR  Continue to monitor your symptoms:  . BP reading is less than 140 (top number) or less than 90 (bottom number)  . No right upper stomach pain . No headaches or seeing spots . No feeling nauseated or throwing up . No swelling in face and hands  Zone 2: CAUTION Call your doctor's office for any of the following:  . BP reading is greater than 140 (top number) or greater than 90 (bottom number)  . Stomach pain under your ribs in the middle or right side . Headaches or seeing spots . Feeling nauseated or throwing up . Swelling in face and hands  Zone 3: EMERGENCY  Seek immediate medical care if you have any of the  following:  . BP reading is greater than160 (top number) or greater than 110 (bottom number) . Severe headaches not improving with Tylenol . Serious difficulty catching your breath . Any worsening symptoms from Zone 2  Preterm Labor and Birth Information  The normal length of a pregnancy is 39-41 weeks. Preterm labor is when labor starts before 37 completed weeks of pregnancy. What are the risk factors for preterm labor? Preterm labor is more likely to occur in women who:  Have certain infections during pregnancy such as a bladder infection, sexually transmitted infection, or infection inside the uterus (chorioamnionitis).  Have a shorter-than-normal cervix.  Have gone into preterm  labor before.  Have had surgery on their cervix.  Are younger than age 69 or older than age 11.  Are African American.  Are pregnant with twins or multiple babies (multiple gestation).  Take street drugs or smoke while pregnant.  Do not gain enough weight while pregnant.  Became pregnant shortly after having been pregnant. What are the symptoms of preterm labor? Symptoms of preterm labor include:  Cramps similar to those that can happen during a menstrual period. The cramps may happen with diarrhea.  Pain in the abdomen or lower back.  Regular uterine contractions that may feel like tightening of the abdomen.  A feeling of increased pressure in the pelvis.  Increased watery or bloody mucus discharge from the vagina.  Water breaking (ruptured amniotic sac). Why is it important to recognize signs of preterm labor? It is important to recognize signs of preterm labor because babies who are born prematurely may not be fully developed. This can put them at an increased risk for:  Long-term (chronic) heart and lung problems.  Difficulty immediately after birth with regulating body systems, including blood sugar, body temperature, heart rate, and breathing rate.  Bleeding in the brain.  Cerebral palsy.  Learning difficulties.  Death. These risks are highest for babies who are born before 41 weeks of pregnancy. How is preterm labor treated? Treatment depends on the length of your pregnancy, your condition, and the health of your baby. It may involve: 1. Having a stitch (suture) placed in your cervix to prevent your cervix from opening too early (cerclage). 2. Taking or being given medicines, such as: ? Hormone medicines. These may be given early in pregnancy to help support the pregnancy. ? Medicine to stop contractions. ? Medicines to help mature the baby's lungs. These may be prescribed if the risk of delivery is high. ? Medicines to prevent your baby from developing  cerebral palsy. If the labor happens before 34 weeks of pregnancy, you may need to stay in the hospital. What should I do if I think I am in preterm labor? If you think that you are going into preterm labor, call your health care provider right away. How can I prevent preterm labor in future pregnancies? To increase your chance of having a full-term pregnancy:  Do not use any tobacco products, such as cigarettes, chewing tobacco, and e-cigarettes. If you need help quitting, ask your health care provider.  Do not use street drugs or medicines that have not been prescribed to you during your pregnancy.  Talk with your health care provider before taking any herbal supplements, even if you have been taking them regularly.  Make sure you gain a healthy amount of weight during your pregnancy.  Watch for infection. If you think that you might have an infection, get it checked right away.  Make sure to tell  your health care provider if you have gone into preterm labor before. This information is not intended to replace advice given to you by your health care provider. Make sure you discuss any questions you have with your health care provider. Document Revised: 12/24/2018 Document Reviewed: 01/23/2016 Elsevier Patient Education  [place].

## 2020-02-09 NOTE — Progress Notes (Addendum)
HIGH-RISK PREGNANCY VISIT Patient name: Paula Riley MRN [id number]  Date of birth: Feb 04, 1981 Chief Complaint:   High Risk Gestation (u/s today)  History of Present Illness:   Paula Riley is a 39 y.o. [phone] female at [redacted]w[redacted]d with an Estimated Date of Delivery: 03/14/20 being seen today for ongoing management of a high-risk pregnancy complicated by fetal growth restriction 2.9% w/ intermittent elevated dopplers.  Today she reports no complaints.  Depression screen [facility] 2/9 12/14/2019 09/01/2019  Decreased Interest 2 0  Down, Depressed, Hopeless 1 0  PHQ - 2 Score 3 0  Altered sleeping 3 0  Tired, decreased energy 1 0  Change in appetite 1 2  Feeling bad or failure about yourself  0 -  Trouble concentrating 0 -  Moving slowly or fidgety/restless 0 -  Suicidal thoughts 0 -  PHQ-9 Score 8 2  Difficult doing work/chores Somewhat difficult -    Contractions: Not present.  .  Movement: Present. denies leaking of fluid.  Review of Systems:   Pertinent items are noted in HPI Denies abnormal vaginal discharge w/ itching/odor/irritation, headaches, visual changes, shortness of breath, chest pain, abdominal pain, severe nausea/vomiting, or problems with urination or bowel movements unless otherwise stated above. Pertinent History Reviewed:  Reviewed past medical,surgical, social, obstetrical and family history.  Reviewed problem list, medications and allergies. Physical Assessment:   Vitals:   02/09/20 1138  BP: 129/88  Pulse: (!) 112  Weight: 139 lb 3.2 oz (63.1 kg)  Body mass index is 23.16 kg/m.           Physical Examination:   General appearance: alert, well appearing, and in no distress  Mental status: alert, oriented to person, place, and time  Skin: warm & dry   Extremities: Edema: Trace    Cardiovascular: normal heart rate noted  Respiratory: normal respiratory effort, no distress  Abdomen: gravid, soft, non-tender  Pelvic: Cervical exam deferred         Fetal Status:  Fetal Heart Rate (bpm): 133 u/s   Movement: Present    Fetal Surveillance Testing today: [place] 19+6 wks,cephalic,posterior placenta gr 3,AFI 11.5 cm,fhr 133 bpm,BPP 8/8,RI .74,.75,.69,.73,.67=90% (intermittent elevated UAD) with EDF  Chaperone: n/a    Results for orders placed or performed in visit on 02/09/20 (from the past 24 hour(s))  POC Urinalysis Dipstick OB   Collection Time: 02/09/20 11:42 AM  Result Value Ref Range   Color, UA     Clarity, UA     Glucose, UA Negative Negative   Bilirubin, UA     Ketones, UA small    Spec Grav, UA     Blood, UA neg    pH, UA     POC,PROTEIN,UA Trace Negative, Trace, Small (1+), Moderate (2+), Large (3+), 4+   Urobilinogen, UA     Nitrite, UA neg    Leukocytes, UA Negative Negative   Appearance     Odor      Assessment & Plan:  1) High-risk pregnancy [id number] at [redacted]w[redacted]d with an Estimated Date of Delivery: 03/14/20   2) FGR 2.9%, w/ intermittent elevated dopplers, good EDF, s/p BMZ 4/21 & 4/22  3) Chronic tachycardia, stable on metoprolol (declines switch to labetalol)  Meds: No orders of the defined types were placed in this encounter.   Labs/procedures today: u/s  Treatment Plan: 2x/wk testing nst alt w/ bpp/dopp, EFW q 3-4wks, IOL @ 37wks or earlier if indicated  Reviewed: Preterm labor symptoms and general obstetric precautions including but not limited to  vaginal bleeding, contractions, leaking of fluid and fetal movement were reviewed in detail with the patient.  All questions were answered.  Follow-up: Return for As scheduled for NST.  Orders Placed This Encounter  Procedures  . POC Urinalysis Dipstick OB   Cheral Marker CNM, [facility] 02/09/2020 2:55 PM

## 2020-02-14 ENCOUNTER — Ambulatory Visit (INDEPENDENT_AMBULATORY_CARE_PROVIDER_SITE_OTHER): Payer: Medicaid Other | Admitting: *Deleted

## 2020-02-14 VITALS — BP 142/91 | HR 84 | Wt 145.0 lb

## 2020-02-14 DIAGNOSIS — Z3A35 35 weeks gestation of pregnancy: Secondary | ICD-10-CM

## 2020-02-14 DIAGNOSIS — O365931 Maternal care for other known or suspected poor fetal growth, third trimester, fetus 1: Secondary | ICD-10-CM | POA: Diagnosis not present

## 2020-02-14 DIAGNOSIS — O0993 Supervision of high risk pregnancy, unspecified, third trimester: Secondary | ICD-10-CM

## 2020-02-14 DIAGNOSIS — Z1389 Encounter for screening for other disorder: Secondary | ICD-10-CM

## 2020-02-14 DIAGNOSIS — Z331 Pregnant state, incidental: Secondary | ICD-10-CM

## 2020-02-14 DIAGNOSIS — O0990 Supervision of high risk pregnancy, unspecified, unspecified trimester: Secondary | ICD-10-CM

## 2020-02-14 LAB — POCT URINALYSIS DIPSTICK OB
Blood, UA: NEGATIVE
Glucose, UA: NEGATIVE
Ketones, UA: NEGATIVE
Leukocytes, UA: NEGATIVE
Nitrite, UA: NEGATIVE
POC,PROTEIN,UA: NEGATIVE

## 2020-02-14 NOTE — Progress Notes (Signed)
   NURSE VISIT- NST  SUBJECTIVE:  Paula Riley is a 39 y.o. [phone] female at [redacted]w[redacted]d, here for a NST for pregnancy complicated by FGR.  She reports active fetal movement, contractions: none, vaginal bleeding: none, membranes: intact.   OBJECTIVE:  BP (!) 142/91   Pulse 84   Wt 145 lb (65.8 kg)   LMP 06/08/2019 (Exact Date)   BMI 24.13 kg/m   Appears well, no apparent distress  Results for orders placed or performed in visit on 02/14/20 (from the past 24 hour(s))  POC Urinalysis Dipstick OB   Collection Time: 02/14/20 12:03 PM  Result Value Ref Range   Color, UA     Clarity, UA     Glucose, UA Negative Negative   Bilirubin, UA     Ketones, UA neg    Spec Grav, UA     Blood, UA neg    pH, UA     POC,PROTEIN,UA Negative Negative, Trace, Small (1+), Moderate (2+), Large (3+), 4+   Urobilinogen, UA     Nitrite, UA neg    Leukocytes, UA Negative Negative   Appearance     Odor      NST: FHR baseline 120 bpm, Variability: moderate, Accelerations:present, Decelerations:  Absent= Cat 1/Reactive Toco: none   ASSESSMENT: [id number] at [redacted]w[redacted]d with FGR NST reactive BP elevated today, denies pre-e symptoms. Discussed with KRB and will get labs.   PLAN: EFM strip reviewed by K. Grace Bushy, CNM Recommendations: keep next appointment as scheduled Pre-E labs today.  Discussed symptoms to report. Will monitor BP at home.   Debbe Odea Daysean Tinkham  02/14/2020 12:04 PM

## 2020-02-15 ENCOUNTER — Other Ambulatory Visit: Payer: Self-pay | Admitting: Women's Health

## 2020-02-15 DIAGNOSIS — IMO0002 Reserved for concepts with insufficient information to code with codable children: Secondary | ICD-10-CM

## 2020-02-15 LAB — PROTEIN / CREATININE RATIO, URINE
Creatinine, Urine: 143 mg/dL
Protein, Ur: 19.8 mg/dL
Protein/Creat Ratio: 138 mg/g creat (ref 0–200)

## 2020-02-15 LAB — COMPREHENSIVE METABOLIC PANEL
ALT: 16 IU/L (ref 0–32)
AST: 24 IU/L (ref 0–40)
Albumin/Globulin Ratio: 1.5 (ref 1.2–2.2)
Albumin: 3.2 g/dL — ABNORMAL LOW (ref 3.8–4.8)
Alkaline Phosphatase: 86 IU/L (ref 48–121)
BUN/Creatinine Ratio: 13 (ref 9–23)
BUN: 6 mg/dL (ref 6–20)
Bilirubin Total: <0.2 mg/dL (ref 0.0–1.2)
CO2: 21 mmol/L (ref 20–29)
Calcium: 8.6 mg/dL — ABNORMAL LOW (ref 8.7–10.2)
Chloride: 108 mmol/L — ABNORMAL HIGH (ref 96–106)
Creatinine, Ser: 0.45 mg/dL — ABNORMAL LOW (ref 0.57–1.00)
GFR calc Af Amer: 147 mL/min/{1.73_m2} (ref >59)
GFR calc non Af Amer: 128 mL/min/{1.73_m2} (ref >59)
Globulin, Total: 2.2 g/dL (ref 1.5–4.5)
Glucose: 71 mg/dL (ref 65–99)
Potassium: 3.8 mmol/L (ref 3.5–5.2)
Sodium: 142 mmol/L (ref 134–144)
Total Protein: 5.4 g/dL — ABNORMAL LOW (ref 6.0–8.5)

## 2020-02-15 LAB — CBC
Hematocrit: 35.5 % (ref 34.0–46.6)
Hemoglobin: 11.9 g/dL (ref 11.1–15.9)
MCH: 31.4 pg (ref 26.6–33.0)
MCHC: 33.5 g/dL (ref 31.5–35.7)
MCV: 94 fL (ref 79–97)
Platelets: 198 10*3/uL (ref 150–450)
RBC: 3.79 x10E6/uL (ref 3.77–5.28)
RDW: 14.9 % (ref 11.7–15.4)
WBC: 6.7 10*3/uL (ref 3.4–10.8)

## 2020-02-16 ENCOUNTER — Ambulatory Visit (INDEPENDENT_AMBULATORY_CARE_PROVIDER_SITE_OTHER): Payer: Medicaid Other

## 2020-02-16 ENCOUNTER — Other Ambulatory Visit (HOSPITAL_COMMUNITY)
Admission: RE | Admit: 2020-02-16 | Discharge: 2020-02-16 | Disposition: A | Discharge status: Home / Self Care | Payer: Medicaid Other | Source: Ambulatory Visit | Attending: Obstetrics & Gynecology | Admitting: Obstetrics & Gynecology

## 2020-02-16 ENCOUNTER — Encounter: Payer: Self-pay | Admitting: Obstetrics & Gynecology

## 2020-02-16 ENCOUNTER — Telehealth (HOSPITAL_COMMUNITY): Payer: Self-pay | Admitting: *Deleted

## 2020-02-16 ENCOUNTER — Ambulatory Visit (INDEPENDENT_AMBULATORY_CARE_PROVIDER_SITE_OTHER): Payer: Medicaid Other | Admitting: Obstetrics & Gynecology

## 2020-02-16 VITALS — BP 143/91 | HR 92 | Wt 143.5 lb

## 2020-02-16 DIAGNOSIS — Z1389 Encounter for screening for other disorder: Secondary | ICD-10-CM

## 2020-02-16 DIAGNOSIS — O0990 Supervision of high risk pregnancy, unspecified, unspecified trimester: Secondary | ICD-10-CM

## 2020-02-16 DIAGNOSIS — O0993 Supervision of high risk pregnancy, unspecified, third trimester: Secondary | ICD-10-CM

## 2020-02-16 DIAGNOSIS — Z3A36 36 weeks gestation of pregnancy: Secondary | ICD-10-CM

## 2020-02-16 DIAGNOSIS — Z331 Pregnant state, incidental: Secondary | ICD-10-CM

## 2020-02-16 DIAGNOSIS — O365930 Maternal care for other known or suspected poor fetal growth, third trimester, not applicable or unspecified: Secondary | ICD-10-CM

## 2020-02-16 DIAGNOSIS — IMO0002 Reserved for concepts with insufficient information to code with codable children: Secondary | ICD-10-CM

## 2020-02-16 DIAGNOSIS — O133 Gestational [pregnancy-induced] hypertension without significant proteinuria, third trimester: Secondary | ICD-10-CM

## 2020-02-16 LAB — POCT URINALYSIS DIPSTICK OB
Blood, UA: NEGATIVE
Glucose, UA: NEGATIVE
Ketones, UA: NEGATIVE
Nitrite, UA: NEGATIVE
POC,PROTEIN,UA: NEGATIVE

## 2020-02-16 NOTE — Progress Notes (Signed)
[place] 36+1 wks,cephalic,fhr 140 bpm,BPP 8/8,posterior placenta gr 3,afi 13.7 cm,elevated UAD with EDF,RI .75,.71,.69,.75=96%,EFW 2060 g 1.9%

## 2020-02-16 NOTE — Progress Notes (Addendum)
HIGH-RISK PREGNANCY VISIT Patient name: Paula Riley MRN [id number]  Date of birth: Feb 11, 1981 Chief Complaint:   High Risk Gestation ([place] today; GBS, GC/CHL)  History of Present Illness:   Paula Riley is a 39 y.o. [phone] female at [redacted]w[redacted]d with an Estimated Date of Delivery: 03/14/20 being seen today for ongoing management of a high-risk pregnancy complicated by FGR with elevated Doppler ratios.  Today she reports no complaints.  Depression screen [facility] 2/9 12/14/2019 09/01/2019  Decreased Interest 2 0  Down, Depressed, Hopeless 1 0  PHQ - 2 Score 3 0  Altered sleeping 3 0  Tired, decreased energy 1 0  Change in appetite 1 2  Feeling bad or failure about yourself  0 -  Trouble concentrating 0 -  Moving slowly or fidgety/restless 0 -  Suicidal thoughts 0 -  PHQ-9 Score 8 2  Difficult doing work/chores Somewhat difficult -    Contractions: Irregular. Vag. Bleeding: None.  Movement: Present. denies leaking of fluid.  Review of Systems:   Pertinent items are noted in HPI Denies abnormal vaginal discharge w/ itching/odor/irritation, headaches, visual changes, shortness of breath, chest pain, abdominal pain, severe nausea/vomiting, or problems with urination or bowel movements unless otherwise stated above. Pertinent History Reviewed:  Reviewed past medical,surgical, social, obstetrical and family history.  Reviewed problem list, medications and allergies. Physical Assessment:   Vitals:   02/16/20 1149 02/16/20 1151  BP: (!) 154/92 (!) 143/91  Pulse: 96 92  Weight: 143 lb 8 oz (65.1 kg)   Body mass index is 23.88 kg/m.           Physical Examination:   General appearance: alert, well appearing, and in no distress  Mental status: alert, oriented to person, place, and time  Skin: warm & dry   Extremities: Edema: Trace    Cardiovascular: normal heart rate noted  Respiratory: normal respiratory effort, no distress  Abdomen: gravid, soft, non-tender  Pelvic: Cervical exam deferred          Fetal Status:     Movement: Present    Fetal Surveillance Testing today: BPP 8/8 with elevated EDF 96%   Chaperone: n/a    Results for orders placed or performed in visit on 02/16/20 (from the past 24 hour(s))  POC Urinalysis Dipstick OB   Collection Time: 02/16/20 11:52 AM  Result Value Ref Range   Color, UA     Clarity, UA     Glucose, UA Negative Negative   Bilirubin, UA     Ketones, UA neg    Spec Grav, UA     Blood, UA neg    pH, UA     POC,PROTEIN,UA Negative Negative, Trace, Small (1+), Moderate (2+), Large (3+), 4+   Urobilinogen, UA     Nitrite, UA neg    Leukocytes, UA Trace (A) Negative   Appearance     Odor      Assessment & Plan:  1) High-risk pregnancy [id number] at [redacted]w[redacted]d with an Estimated Date of Delivery: 03/14/20   2) FGR 1.9% w/elevated EDF 96%, stable, reassuring fetal surveillance  3)  GHTN, stable no meds required, IOL for FGR next week anyway, so does not change clinical decsion, labs were normal  Meds: No orders of the defined types were placed in this encounter.   Labs/procedures today: BPP/dopplers  Treatment Plan:  IOL 02/22/20, NST 02/20/20  Reviewed: Term labor symptoms and general obstetric precautions including but not limited to vaginal bleeding, contractions, leaking of fluid and fetal movement were reviewed  in detail with the patient.  All questions were answered. Has home bp cuff. Rx faxed to . Check bp weekly, let us know if >140/90.   Follow-up: Return in about 4 days (around 02/20/2020) for NST, Nurse only.  Orders Placed This Encounter  Procedures  . Strep Gp B NAA  . POC Urinalysis Dipstick OB   Florian Buff  02/16/2020 12:04 PM

## 2020-02-16 NOTE — Telephone Encounter (Signed)
Preadmission screen  

## 2020-02-16 NOTE — Treatment Plan (Signed)
   Induction Assessment Scheduling Form: Fax to [facility]:  [phone]  Lynnett Langlinais                                                                                   DOB:  10/02/1980                                                            MRN:  [id number]                                                                     Phone #:                            Provider:  [facility]  GP:  [id number]                                                            Estimated Date of Delivery: 03/14/20  Dating Criteria: LMP, 1st trimester sonogram    Medical Indications for induction:  FGR, <3% Admission Date/Time:  02/22/20 Gestational age on admission:  [redacted]w[redacted]d   Filed Weights   02/16/20 1149  Weight: 143 lb 8 oz (65.1 kg)   HIV:  Non Reactive (03/31 0842) [id number]    Cervix not examined   Method of induction(proposed):  Cytotec/foley/pitocin   Scheduling Provider Signature:  Lazaro Arms, MD                                            Today's Date:  02/16/2020

## 2020-02-17 LAB — CERVICOVAGINAL ANCILLARY ONLY
Chlamydia: NEGATIVE
Comment: NEGATIVE
Comment: NORMAL
Neisseria Gonorrhea: NEGATIVE

## 2020-02-18 LAB — STREP GP B NAA: Strep Gp B NAA: NEGATIVE

## 2020-02-20 ENCOUNTER — Other Ambulatory Visit: Payer: Self-pay | Admitting: Advanced Practice Midwife

## 2020-02-20 ENCOUNTER — Other Ambulatory Visit: Payer: Self-pay | Admitting: Student

## 2020-02-20 ENCOUNTER — Ambulatory Visit (INDEPENDENT_AMBULATORY_CARE_PROVIDER_SITE_OTHER): Payer: Medicaid Other | Admitting: *Deleted

## 2020-02-20 VITALS — BP 146/95 | HR 98 | Wt 139.6 lb

## 2020-02-20 DIAGNOSIS — O0990 Supervision of high risk pregnancy, unspecified, unspecified trimester: Secondary | ICD-10-CM

## 2020-02-20 DIAGNOSIS — Z3A36 36 weeks gestation of pregnancy: Secondary | ICD-10-CM

## 2020-02-20 DIAGNOSIS — O288 Other abnormal findings on antenatal screening of mother: Secondary | ICD-10-CM | POA: Diagnosis not present

## 2020-02-20 DIAGNOSIS — Z331 Pregnant state, incidental: Secondary | ICD-10-CM

## 2020-02-20 DIAGNOSIS — O0993 Supervision of high risk pregnancy, unspecified, third trimester: Secondary | ICD-10-CM

## 2020-02-20 DIAGNOSIS — O365930 Maternal care for other known or suspected poor fetal growth, third trimester, not applicable or unspecified: Secondary | ICD-10-CM | POA: Diagnosis not present

## 2020-02-20 DIAGNOSIS — Z1389 Encounter for screening for other disorder: Secondary | ICD-10-CM

## 2020-02-20 LAB — POCT URINALYSIS DIPSTICK OB
Blood, UA: NEGATIVE
Glucose, UA: NEGATIVE
Ketones, UA: NEGATIVE
Leukocytes, UA: NEGATIVE
Nitrite, UA: NEGATIVE
POC,PROTEIN,UA: NEGATIVE

## 2020-02-20 NOTE — Progress Notes (Signed)
   NURSE VISIT- NST  SUBJECTIVE:  Paula Riley is a 39 y.o. [phone] female at [redacted]w[redacted]d, here for a NST for pregnancy complicated by FGR and GHTN.  She reports active fetal movement, contractions: none, vaginal bleeding: none, membranes: intact.   OBJECTIVE:  BP (!) 146/95   Pulse 98   Wt 139 lb 9.6 oz (63.3 kg)   LMP 06/08/2019 (Exact Date)   BMI 23.23 kg/m   Appears well, no apparent distress  Results for orders placed or performed in visit on 02/20/20 (from the past 24 hour(s))  POC Urinalysis Dipstick OB   Collection Time: 02/20/20 11:05 AM  Result Value Ref Range   Color, UA     Clarity, UA     Glucose, UA Negative Negative   Bilirubin, UA     Ketones, UA neg    Spec Grav, UA     Blood, UA neg    pH, UA     POC,PROTEIN,UA Negative Negative, Trace, Small (1+), Moderate (2+), Large (3+), 4+   Urobilinogen, UA     Nitrite, UA neg    Leukocytes, UA Negative Negative   Appearance     Odor      NST: FHR baseline 130 bpm, Variability: moderate, Accelerations:present, Decelerations:  Absent= Cat 1/Reactive Toco: none   ASSESSMENT: [id number] at [redacted]w[redacted]d with FGR and GHTN NST reactive  PLAN: EFM strip reviewed by Dr. Despina Hidden   Recommendations: keep next appointment as scheduled    Jobe Marker  02/20/2020 12:42 PM

## 2020-02-21 ENCOUNTER — Telehealth (HOSPITAL_COMMUNITY): Payer: Self-pay | Admitting: *Deleted

## 2020-02-21 ENCOUNTER — Other Ambulatory Visit (HOSPITAL_COMMUNITY)
Admission: RE | Admit: 2020-02-21 | Discharge: 2020-02-21 | Disposition: A | Discharge status: Home / Self Care | Payer: Medicaid Other | Source: Ambulatory Visit | Attending: Obstetrics & Gynecology | Admitting: Obstetrics & Gynecology

## 2020-02-21 ENCOUNTER — Encounter (HOSPITAL_COMMUNITY): Payer: Self-pay | Admitting: *Deleted

## 2020-02-21 DIAGNOSIS — Z01812 Encounter for preprocedural laboratory examination: Secondary | ICD-10-CM | POA: Insufficient documentation

## 2020-02-21 DIAGNOSIS — Z20822 Contact with and (suspected) exposure to covid-19: Secondary | ICD-10-CM | POA: Insufficient documentation

## 2020-02-21 LAB — SARS CORONAVIRUS 2 (TAT 6-24 HRS): SARS Coronavirus 2: NEGATIVE

## 2020-02-21 NOTE — Telephone Encounter (Signed)
Preadmission screen  

## 2020-02-22 ENCOUNTER — Telehealth: Payer: Self-pay | Admitting: Obstetrics & Gynecology

## 2020-02-22 ENCOUNTER — Encounter (HOSPITAL_COMMUNITY): Payer: Self-pay | Admitting: Obstetrics & Gynecology

## 2020-02-22 ENCOUNTER — Telehealth: Payer: Self-pay | Admitting: *Deleted

## 2020-02-22 ENCOUNTER — Other Ambulatory Visit: Payer: Self-pay

## 2020-02-22 ENCOUNTER — Inpatient Hospital Stay (HOSPITAL_COMMUNITY)
Admission: AD | Admit: 2020-02-22 | Discharge: 2020-02-25 | DRG: 806 | Disposition: A | Discharge status: Home / Self Care | Payer: Medicaid Other | Attending: Obstetrics and Gynecology | Admitting: Obstetrics and Gynecology

## 2020-02-22 ENCOUNTER — Inpatient Hospital Stay (HOSPITAL_COMMUNITY): Payer: Medicaid Other

## 2020-02-22 DIAGNOSIS — I471 Supraventricular tachycardia: Secondary | ICD-10-CM | POA: Diagnosis present

## 2020-02-22 DIAGNOSIS — K219 Gastro-esophageal reflux disease without esophagitis: Secondary | ICD-10-CM | POA: Diagnosis present

## 2020-02-22 DIAGNOSIS — R87612 Low grade squamous intraepithelial lesion on cytologic smear of cervix (LGSIL): Secondary | ICD-10-CM | POA: Diagnosis present

## 2020-02-22 DIAGNOSIS — O365990 Maternal care for other known or suspected poor fetal growth, unspecified trimester, not applicable or unspecified: Secondary | ICD-10-CM | POA: Diagnosis present

## 2020-02-22 DIAGNOSIS — R4589 Other symptoms and signs involving emotional state: Secondary | ICD-10-CM | POA: Diagnosis present

## 2020-02-22 DIAGNOSIS — O9962 Diseases of the digestive system complicating childbirth: Secondary | ICD-10-CM | POA: Diagnosis present

## 2020-02-22 DIAGNOSIS — R000 Tachycardia, unspecified: Secondary | ICD-10-CM | POA: Diagnosis present

## 2020-02-22 DIAGNOSIS — F419 Anxiety disorder, unspecified: Secondary | ICD-10-CM | POA: Diagnosis present

## 2020-02-22 DIAGNOSIS — O133 Gestational [pregnancy-induced] hypertension without significant proteinuria, third trimester: Secondary | ICD-10-CM | POA: Diagnosis present

## 2020-02-22 DIAGNOSIS — O365930 Maternal care for other known or suspected poor fetal growth, third trimester, not applicable or unspecified: Secondary | ICD-10-CM | POA: Diagnosis present

## 2020-02-22 DIAGNOSIS — O99344 Other mental disorders complicating childbirth: Secondary | ICD-10-CM | POA: Diagnosis present

## 2020-02-22 DIAGNOSIS — O99284 Endocrine, nutritional and metabolic diseases complicating childbirth: Secondary | ICD-10-CM | POA: Diagnosis present

## 2020-02-22 DIAGNOSIS — Z20822 Contact with and (suspected) exposure to covid-19: Secondary | ICD-10-CM | POA: Diagnosis present

## 2020-02-22 DIAGNOSIS — Z3A37 37 weeks gestation of pregnancy: Secondary | ICD-10-CM | POA: Diagnosis not present

## 2020-02-22 DIAGNOSIS — E876 Hypokalemia: Secondary | ICD-10-CM | POA: Diagnosis present

## 2020-02-22 DIAGNOSIS — O9942 Diseases of the circulatory system complicating childbirth: Secondary | ICD-10-CM | POA: Diagnosis present

## 2020-02-22 DIAGNOSIS — O134 Gestational [pregnancy-induced] hypertension without significant proteinuria, complicating childbirth: Principal | ICD-10-CM | POA: Diagnosis present

## 2020-02-22 DIAGNOSIS — F418 Other specified anxiety disorders: Secondary | ICD-10-CM | POA: Diagnosis present

## 2020-02-22 DIAGNOSIS — D649 Anemia, unspecified: Secondary | ICD-10-CM | POA: Diagnosis present

## 2020-02-22 HISTORY — DX: Mental disorder, not otherwise specified: F99

## 2020-02-22 LAB — CBC
HCT: 37.2 % (ref 36.0–46.0)
Hemoglobin: 12.3 g/dL (ref 12.0–15.0)
MCH: 31.7 pg (ref 26.0–34.0)
MCHC: 33.1 g/dL (ref 30.0–36.0)
MCV: 95.9 fL (ref 80.0–100.0)
Platelets: 264 10*3/uL (ref 150–400)
RBC: 3.88 MIL/uL (ref 3.87–5.11)
RDW: 15.9 % — ABNORMAL HIGH (ref 11.5–15.5)
WBC: 7.7 10*3/uL (ref 4.0–10.5)
nRBC: 0 % (ref 0.0–0.2)

## 2020-02-22 LAB — COMPREHENSIVE METABOLIC PANEL
ALT: 18 U/L (ref 0–44)
AST: 22 U/L (ref 15–41)
Albumin: 2.6 g/dL — ABNORMAL LOW (ref 3.5–5.0)
Alkaline Phosphatase: 78 U/L (ref 38–126)
Anion gap: 10 (ref 5–15)
BUN: <5 mg/dL — ABNORMAL LOW (ref 6–20)
CO2: 22 mmol/L (ref 22–32)
Calcium: 8.5 mg/dL — ABNORMAL LOW (ref 8.9–10.3)
Chloride: 106 mmol/L (ref 98–111)
Creatinine, Ser: 0.55 mg/dL (ref 0.44–1.00)
GFR calc Af Amer: >60 mL/min (ref >60)
GFR calc non Af Amer: >60 mL/min (ref >60)
Glucose, Bld: 87 mg/dL (ref 70–99)
Potassium: 2.7 mmol/L — CL (ref 3.5–5.1)
Sodium: 138 mmol/L (ref 135–145)
Total Bilirubin: 0.9 mg/dL (ref 0.3–1.2)
Total Protein: 5.5 g/dL — ABNORMAL LOW (ref 6.5–8.1)

## 2020-02-22 LAB — TYPE AND SCREEN
ABO/RH(D): A POS
Antibody Screen: NEGATIVE

## 2020-02-22 LAB — ABO/RH: ABO/RH(D): A POS

## 2020-02-22 MED ORDER — OXYTOCIN-SODIUM CHLORIDE 30-0.9 UT/500ML-% IV SOLN
1.0000 m[IU]/min | INTRAVENOUS | Status: DC
  Administered 2020-02-22: 1 m[IU]/min via INTRAVENOUS

## 2020-02-22 MED ORDER — LACTATED RINGERS IV SOLN: INTRAVENOUS | Status: DC

## 2020-02-22 MED ORDER — SOD CITRATE-CITRIC ACID 500-334 MG/5ML PO SOLN: 30.0000 mL | ORAL | Status: DC | PRN

## 2020-02-22 MED ORDER — ONDANSETRON HCL 4 MG/2ML IJ SOLN: 4.0000 mg | Freq: Four times a day (QID) | INTRAMUSCULAR | Status: DC | PRN

## 2020-02-22 MED ORDER — OXYCODONE-ACETAMINOPHEN 5-325 MG PO TABS: 1.0000 | ORAL_TABLET | ORAL | Status: DC | PRN

## 2020-02-22 MED ORDER — POTASSIUM CHLORIDE 10 MEQ/100ML IV SOLN
10.0000 meq | INTRAVENOUS | Status: AC
  Administered 2020-02-22 (×3): 10 meq via INTRAVENOUS
  Filled 2020-02-22 (×3): qty 100

## 2020-02-22 MED ORDER — MISOPROSTOL 25 MCG QUARTER TABLET
25.0000 ug | ORAL_TABLET | ORAL | Status: DC | PRN
  Filled 2020-02-22: qty 1

## 2020-02-22 MED ORDER — LACTATED RINGERS IV SOLN: 500.0000 mL | Freq: Once | INTRAVENOUS | Status: DC

## 2020-02-22 MED ORDER — OXYTOCIN BOLUS FROM INFUSION
500.0000 mL | Freq: Once | INTRAVENOUS | Status: AC
  Administered 2020-02-23: 500 mL via INTRAVENOUS

## 2020-02-22 MED ORDER — HYDROXYZINE HCL 10 MG PO TABS
10.0000 mg | ORAL_TABLET | Freq: Three times a day (TID) | ORAL | Status: DC | PRN
  Administered 2020-02-22: 10 mg via ORAL
  Filled 2020-02-22 (×2): qty 1

## 2020-02-22 MED ORDER — DIPHENHYDRAMINE HCL 50 MG/ML IJ SOLN: 12.5000 mg | INTRAMUSCULAR | Status: DC | PRN

## 2020-02-22 MED ORDER — OXYTOCIN-SODIUM CHLORIDE 30-0.9 UT/500ML-% IV SOLN: 1.0000 m[IU]/min | INTRAVENOUS | Status: DC

## 2020-02-22 MED ORDER — FENTANYL-BUPIVACAINE-NACL 0.5-0.125-0.9 MG/250ML-% EP SOLN: 12.0000 mL/h | EPIDURAL | Status: DC | PRN

## 2020-02-22 MED ORDER — EPHEDRINE 5 MG/ML INJ: 10.0000 mg | INTRAVENOUS | Status: DC | PRN

## 2020-02-22 MED ORDER — PHENYLEPHRINE 40 MCG/ML (10ML) SYRINGE FOR IV PUSH (FOR BLOOD PRESSURE SUPPORT): 80.0000 ug | PREFILLED_SYRINGE | INTRAVENOUS | Status: DC | PRN

## 2020-02-22 MED ORDER — OXYTOCIN-SODIUM CHLORIDE 30-0.9 UT/500ML-% IV SOLN
2.5000 [IU]/h | INTRAVENOUS | Status: DC
  Administered 2020-02-23: 2.5 [IU]/h via INTRAVENOUS
  Filled 2020-02-22 (×2): qty 500

## 2020-02-22 MED ORDER — ACETAMINOPHEN 325 MG PO TABS: 650.0000 mg | ORAL_TABLET | ORAL | Status: DC | PRN

## 2020-02-22 MED ORDER — TERBUTALINE SULFATE 1 MG/ML IJ SOLN: 0.2500 mg | Freq: Once | INTRAMUSCULAR | Status: DC | PRN

## 2020-02-22 MED ORDER — OXYCODONE-ACETAMINOPHEN 5-325 MG PO TABS: 2.0000 | ORAL_TABLET | ORAL | Status: DC | PRN

## 2020-02-22 MED ORDER — LACTATED RINGERS IV SOLN: 500.0000 mL | INTRAVENOUS | Status: DC | PRN

## 2020-02-22 MED ORDER — LIDOCAINE HCL (PF) 1 % IJ SOLN: 30.0000 mL | INTRAMUSCULAR | Status: DC | PRN

## 2020-02-22 MED ORDER — PROMETHAZINE HCL 25 MG/ML IJ SOLN
12.5000 mg | Freq: Once | INTRAMUSCULAR | Status: AC
  Administered 2020-02-22: 12.5 mg via INTRAVENOUS
  Filled 2020-02-22: qty 1

## 2020-02-22 MED ORDER — BUTORPHANOL TARTRATE 1 MG/ML IJ SOLN
2.0000 mg | Freq: Once | INTRAMUSCULAR | Status: AC
  Administered 2020-02-22: 2 mg via INTRAVENOUS
  Filled 2020-02-22: qty 2

## 2020-02-22 MED ORDER — POTASSIUM CHLORIDE 2 MEQ/ML IV SOLN
INTRAVENOUS | Status: DC
  Filled 2020-02-22 (×2): qty 1000

## 2020-02-22 MED ORDER — METOPROLOL SUCCINATE ER 100 MG PO TB24
100.0000 mg | ORAL_TABLET | Freq: Every day | ORAL | Status: DC
  Filled 2020-02-22: qty 1

## 2020-02-22 NOTE — H&P (Addendum)
OBSTETRIC ADMISSION HISTORY AND PHYSICAL  Paula Riley is a 39 y.o. female [phone] with IUP at [redacted]w[redacted]d by LMP presenting for IOL due to FGR and GHTN. She reports +FMs, No LOF, no VB.  Patient reports seeing spots that was occurred for the first time upon admission. She denies headaches, peripheral edema, and RUQ pain.  She plans on bottle feeding. She requests PP liletta for birth control as an outpatient. She received her prenatal care at [facility]   Dating: By LMP --->  Estimated Date of Delivery: 03/14/20   Sono:   @[redacted]w[redacted]d , CWD, normal anatomy, cephalic presentation, posterior placenta, 1638g, 2.9% EFW  Prenatal History/Complications: - gHTN - FGR, intermittent elevated UAD with EDF  - Sinus tach on metoprolol tartrate 50mg  BID (reports taking 100mg  daily)  - Anemia of Pregnancy (resolved) - Anxiety (no medications)   Past Medical History: Past Medical History:  Diagnosis Date  . Anxiety   . GERD (gastroesophageal reflux disease)   . Heart murmur   . History of Holter monitoring 06/2010 and 11/2011   Cardionet montior with sinus tachycardia, PAC's only, no arrhythmias either monitor  . Hx of echocardiogram 06/2010   normal  . Mental disorder   . Palpitations   . Pregnancy induced hypertension     Past Surgical History: Past Surgical History:  Procedure Laterality Date  . DENTAL SURGERY    . INDUCED ABORTION      Obstetrical History: OB History    Gravida  4   Para  2   Term  2   Preterm      AB  1   Living  2     SAB      TAB  1   Ectopic      Multiple      Live Births  2           Social History: Social History   Socioeconomic History  . Marital status: Single    Spouse name: Not on file  . Number of children: 2  . Years of education: 68  . Highest education level: Some college, no degree  Occupational History  . Occupation: [employer]  Tobacco Use  . Smoking status: Never Smoker  . Smokeless tobacco: Never Used  Substance and Sexual  Activity  . Alcohol use: No    Alcohol/week: 0.0 standard drinks  . Drug use: No  . Sexual activity: Yes    Birth control/protection: None  Other Topics Concern  . Not on file  Social History Narrative   Lives with 2 children in an apartment on the second floor.     Works at 12/[employer].  Education: high school.   Social Determinants of Health   Financial Resource Strain: Low Risk   . Difficulty of Paying Living Expenses: Not very hard  Food Insecurity: No Food Insecurity  . Worried About 07/2010 in the Last Year: Never true  . Ran Out of Food in the Last Year: Never true  Transportation Needs: No Transportation Needs  . Lack of Transportation (Medical): No  . Lack of Transportation (Non-Medical): No  Physical Activity:   . Days of Exercise per Week:   . Minutes of Exercise per Session:   Stress: Stress Concern Present  . Feeling of Stress : Very much  Social Connections: Somewhat Isolated  . Frequency of Communication with Friends and Family: Twice a week  . Frequency of Social Gatherings with Friends and Family: Once a week  . Attends Religious  Services: More than 4 times per year  . Active Member of Clubs or Organizations: No  . Attends Archivist Meetings: Never  . Marital Status: Never married    Family History: Family History  Problem Relation Age of Onset  . Hypertension Other   . CAD Other   . Diabetes Maternal Grandmother   . Breast cancer Maternal Grandmother   . Healthy Son   . Healthy Sister        x 6  . Healthy Sister        x 2  . Healthy Son     Allergies: No Known Allergies  Medications Prior to Admission  Medication Sig Dispense Refill Last Dose  . acetaminophen (TYLENOL) 500 MG tablet Take 1,000 mg by mouth every 6 (six) hours as needed for mild pain.   02/21/2020 at Unknown time  . ferrous sulfate 325 (65 FE) MG tablet Take 1 tablet (325 mg total) by mouth 2 (two) times daily with a meal. 60 tablet 3 02/21/2020 at Unknown time   . metoprolol tartrate (LOPRESSOR) 50 MG tablet Take 50 mg by mouth 2 (two) times daily.   02/22/2020 at Unknown time  . omeprazole (PRILOSEC) 20 MG capsule Take 1 capsule (20 mg total) by mouth daily. (Patient taking differently: Take 20 mg by mouth. ) 30 capsule 6 02/21/2020 at Unknown time  . Prenatal Vit-Fe Fumarate-FA (MULTIVITAMIN-PRENATAL) 27-0.8 MG TABS tablet Take 1 tablet by mouth daily at 12 noon.   02/21/2020 at Unknown time  . Blood Pressure Monitor MISC For regular home bp monitoring during pregnancy 1 each 0   . Doxylamine Succinate, Sleep, (UNISOM PO) Take by mouth as needed.        Review of Systems   All systems reviewed and negative except as stated in HPI  Blood pressure (!) 151/85, pulse 74, temperature 98.1 F (36.7 C), temperature source Oral, resp. rate 16, height 5\' 5"  (1.651 m), weight 63.7 kg, last menstrual period 06/08/2019. General appearance: alert, cooperative, appears stated age and no distress Lungs: normal effort Heart: regular rate  Abdomen: soft, non-tender; bowel sounds normal Pelvic: gravid uterus Extremities: Homans sign is negative, no sign of DVT Presentation: cephalic Fetal monitoringBaseline: 135 bpm, Variability: Good {> 6 bpm), Accelerations: Reactive and Decelerations: Variable: moderate Uterine activity: irregular   Dilation: 1.5 Effacement (%): 50 Station: -2 Exam by:: Dr. Dione Plover   Prenatal labs: ABO, Rh: --/--/A POS, A POS Performed at [facility], [place] [address], [place], [place] [address]  [phone] 1632) Antibody: NEG (06/09 1632) Rubella: 2.29 (12/17 1605) RPR: Non Reactive (03/31 0842)  HBsAg: Negative (12/17 1605)  HIV: Non Reactive (03/31 0842)  GBS: Negative/-- (06/03 1600)  2 hr Glucola normal 77/75/75 Genetic screening  Integrated 2: negative, MaterniT21: normal female  Anatomy US normal female  Prenatal Transfer Tool  Maternal Diabetes: No Genetic Screening: Normal Maternal Ultrasounds/Referrals: IUGR Fetal  Ultrasounds or other Referrals:  None  Maternal Substance Abuse:  No Significant Maternal Medications:  Meds include: Other: Metoprolol  Significant Maternal Lab Results: Group B Strep negative  Results for orders placed or performed during the hospital encounter of 02/22/20 (from the past 24 hour(s))  Type and screen   Collection Time: 02/22/20  4:32 PM  Result Value Ref Range   ABO/RH(D) A POS    Antibody Screen NEG    Sample Expiration      02/25/2020,2359 Performed at [facility], [place] [address], [place], [place] [address]  ABO/Rh   Collection Time: 02/22/20  4:32 PM  Result Value Ref Range   ABO/RH(D)      A POS Performed at [facility], [address], [place], [place] [address]   CBC   Collection Time: 02/22/20  4:44 PM  Result Value Ref Range   WBC 7.7 4.0 - 10.5 K/uL   RBC 3.88 3.87 - 5.11 MIL/uL   Hemoglobin 12.3 12.0 - 15.0 g/dL   HCT 79.0 24.0 - 97.3 %   MCV 95.9 80.0 - 100.0 fL   MCH 31.7 26.0 - 34.0 pg   MCHC 33.1 30.0 - 36.0 g/dL   RDW 53.2 (H) 99.2 - 42.6 %   Platelets 264 150 - 400 K/uL   nRBC 0.0 0.0 - 0.2 %   Patient Active Problem List   Diagnosis Date Noted  . Poor clinical fetal growth in third trimester 02/22/2020  . IUGR (intrauterine growth restriction) affecting care of mother 01/09/2020  . Anemia 12/15/2019  . COVID-19 09/19/2019  . LGSIL on Pap smear of cervix 09/19/2019  . Supervision of high risk pregnancy, antepartum 09/01/2019  . Breast cyst, right 10/15/2017  . Anxiety about health 05/03/2015  . Sinus tachycardia 08/28/2014  . Palpitations 12/02/2011  . GERD (gastroesophageal reflux disease) 11/13/2011  . Abdominal pain, other specified site 11/13/2011    Assessment/Plan:  Linzi Ohlinger is a 39 y.o. [id number] at [redacted]w[redacted]d here for IOL 2/2 to FGR with pregnancy complicated by PIH, SVT on Metoprolol.   #Labor: upon arrival with cervical exam 1.5cm/50%/-2, station, foley bulb, start pitocin, anticipate vaginal delivery   #Pain: Labor support without medications  #FWB: Category II but reassuring for moderate variability and accels  #ID:  GBS negative  #MOF: bottle  #MOC: outpatient Liletta IUD  #Circ:  Yes (inpatient) #Chronic Supraventricular tachycardia: Metoprolol 50 mg BID > Metop succinate 100mg  daily while inpatient #Anemia of Pregnancy: resolved, hgb 12.3 on admission #FGR 1.9%, elevated dopplers on most recent from 02/16/2020, formal read not yet finalized #Anxiety: prescribe vistaril 10 mg TID PRN   #gHTN: Elevated BP's since 02/14/2020 with normal labs at that time. Patient with mild range BP's on admission in setting of significant anxiety. Repeat labs pending. Reported seeing some spots but no headache, again exceedingly anxious. Will monitor, treat anxiety as above, and reassess. Low threshold to initiate Mg gtt.   04/15/2020, MD Family Medicine, PGY-1 02/22/2020, 6:29 PM    OB FELLOW ATTESTATION  I have seen and examined this patient and edited the above documentation in the resident's note to reflect any changes or updates.  04/23/2020, MD/[facility] OB Fellow  02/22/2020, 6:58 PM

## 2020-02-22 NOTE — Telephone Encounter (Signed)
Patient called stating that a Mrs. Landon called her yesterday to go over her induction information, pt asked her if she should be there today at 8:40 am , but she told her no to wait until someone calls her to come in because they have no beds. Pt states that she is still awaiting for a phone call, she is suppose to be induced today. Please contact pt

## 2020-02-22 NOTE — Progress Notes (Signed)
Patient ID: Paula Riley, female   DOB: August 05, 1981, 39 y.o.   MRN: [id number]  Feeling anxious and nervous; high level of anxiety; shaky and teary; concerned about the whole process, and specifically her low K+ level; cervical foley in place with low-dose Pit to be held at 50mu/min; received Vistaril earlier- still feeling anxious  BPs 140/78, 151/81, P 80 FHR 125-130, +accels, no decels Ctx irreg 2-5 mins, irreg Cx deferred  IUP@37 .0wks FGR gHTN- stable Hypokalemic Cx unfavorable  -Receiving K+ runs and K+ has been added to LR- pt reassured and told we will recheck K+ level at 0500 -Will plan to increase Pitocin once foley comes out -Will give Stadol/Phen to help her rest -Anticipate vag del  Arabella Merles CNM 02/22/2020

## 2020-02-22 NOTE — Telephone Encounter (Signed)
Spoke to patient and informed that she could get a call at any time but could be as late as midnight tonight.  Advised to go about her day as usual but expect a call at any time.  Pt verbalized understanding.

## 2020-02-23 ENCOUNTER — Encounter (HOSPITAL_COMMUNITY): Payer: Self-pay | Admitting: Obstetrics & Gynecology

## 2020-02-23 DIAGNOSIS — Z3A37 37 weeks gestation of pregnancy: Secondary | ICD-10-CM

## 2020-02-23 DIAGNOSIS — E876 Hypokalemia: Secondary | ICD-10-CM | POA: Diagnosis present

## 2020-02-23 DIAGNOSIS — O365930 Maternal care for other known or suspected poor fetal growth, third trimester, not applicable or unspecified: Secondary | ICD-10-CM

## 2020-02-23 DIAGNOSIS — O134 Gestational [pregnancy-induced] hypertension without significant proteinuria, complicating childbirth: Secondary | ICD-10-CM

## 2020-02-23 LAB — CBC
HCT: 35.1 % — ABNORMAL LOW (ref 36.0–46.0)
Hemoglobin: 11.4 g/dL — ABNORMAL LOW (ref 12.0–15.0)
MCH: 31.5 pg (ref 26.0–34.0)
MCHC: 32.5 g/dL (ref 30.0–36.0)
MCV: 97 fL (ref 80.0–100.0)
Platelets: 239 10*3/uL (ref 150–400)
RBC: 3.62 MIL/uL — ABNORMAL LOW (ref 3.87–5.11)
RDW: 15.9 % — ABNORMAL HIGH (ref 11.5–15.5)
WBC: 12.1 10*3/uL — ABNORMAL HIGH (ref 4.0–10.5)
nRBC: 0 % (ref 0.0–0.2)

## 2020-02-23 LAB — BASIC METABOLIC PANEL
Anion gap: 12 (ref 5–15)
BUN: <5 mg/dL — ABNORMAL LOW (ref 6–20)
CO2: 20 mmol/L — ABNORMAL LOW (ref 22–32)
Calcium: 8.5 mg/dL — ABNORMAL LOW (ref 8.9–10.3)
Chloride: 109 mmol/L (ref 98–111)
Creatinine, Ser: 0.58 mg/dL (ref 0.44–1.00)
GFR calc Af Amer: >60 mL/min (ref >60)
GFR calc non Af Amer: >60 mL/min (ref >60)
Glucose, Bld: 86 mg/dL (ref 70–99)
Potassium: 3.5 mmol/L (ref 3.5–5.1)
Sodium: 141 mmol/L (ref 135–145)

## 2020-02-23 LAB — RPR: RPR Ser Ql: NONREACTIVE

## 2020-02-23 LAB — MAGNESIUM: Magnesium: 1.4 mg/dL — ABNORMAL LOW (ref 1.7–2.4)

## 2020-02-23 MED ORDER — BENZOCAINE-MENTHOL 20-0.5 % EX AERO
1.0000 "application " | INHALATION_SPRAY | CUTANEOUS | Status: DC | PRN
  Administered 2020-02-23: 1 via TOPICAL
  Filled 2020-02-23: qty 56

## 2020-02-23 MED ORDER — TETANUS-DIPHTH-ACELL PERTUSSIS 5-2.5-18.5 LF-MCG/0.5 IM SUSP: 0.5000 mL | Freq: Once | INTRAMUSCULAR | Status: DC

## 2020-02-23 MED ORDER — SIMETHICONE 80 MG PO CHEW: 80.0000 mg | CHEWABLE_TABLET | ORAL | Status: DC | PRN

## 2020-02-23 MED ORDER — WITCH HAZEL-GLYCERIN EX PADS: 1.0000 "application " | MEDICATED_PAD | CUTANEOUS | Status: DC | PRN

## 2020-02-23 MED ORDER — DIPHENHYDRAMINE HCL 25 MG PO CAPS: 25.0000 mg | ORAL_CAPSULE | Freq: Four times a day (QID) | ORAL | Status: DC | PRN

## 2020-02-23 MED ORDER — ACETAMINOPHEN 325 MG PO TABS
650.0000 mg | ORAL_TABLET | ORAL | Status: DC | PRN
  Administered 2020-02-25: 650 mg via ORAL
  Filled 2020-02-23: qty 2

## 2020-02-23 MED ORDER — METOPROLOL SUCCINATE ER 50 MG PO TB24
50.0000 mg | ORAL_TABLET | Freq: Once | ORAL | Status: DC
  Filled 2020-02-23: qty 1

## 2020-02-23 MED ORDER — METOPROLOL TARTRATE 50 MG PO TABS
50.0000 mg | ORAL_TABLET | Freq: Two times a day (BID) | ORAL | Status: DC
  Administered 2020-02-23: 50 mg via ORAL
  Filled 2020-02-23 (×3): qty 1

## 2020-02-23 MED ORDER — DIBUCAINE (PERIANAL) 1 % EX OINT
1.0000 "application " | TOPICAL_OINTMENT | CUTANEOUS | Status: DC | PRN
  Administered 2020-02-23: 1 via RECTAL
  Filled 2020-02-23: qty 28

## 2020-02-23 MED ORDER — ONDANSETRON HCL 4 MG/2ML IJ SOLN: 4.0000 mg | INTRAMUSCULAR | Status: DC | PRN

## 2020-02-23 MED ORDER — METOPROLOL SUCCINATE ER 100 MG PO TB24
100.0000 mg | ORAL_TABLET | Freq: Every day | ORAL | Status: DC
  Administered 2020-02-24 – 2020-02-25 (×2): 100 mg via ORAL
  Filled 2020-02-23 (×3): qty 1

## 2020-02-23 MED ORDER — FERROUS SULFATE 325 (65 FE) MG PO TABS
325.0000 mg | ORAL_TABLET | Freq: Two times a day (BID) | ORAL | Status: DC
  Administered 2020-02-23 – 2020-02-25 (×5): 325 mg via ORAL
  Filled 2020-02-23 (×5): qty 1

## 2020-02-23 MED ORDER — ZOLPIDEM TARTRATE 5 MG PO TABS: 5.0000 mg | ORAL_TABLET | Freq: Every evening | ORAL | Status: DC | PRN

## 2020-02-23 MED ORDER — FENTANYL CITRATE (PF) 100 MCG/2ML IJ SOLN: 100.0000 ug | INTRAMUSCULAR | Status: DC | PRN

## 2020-02-23 MED ORDER — SENNOSIDES-DOCUSATE SODIUM 8.6-50 MG PO TABS
2.0000 | ORAL_TABLET | ORAL | Status: DC
  Filled 2020-02-23: qty 2

## 2020-02-23 MED ORDER — FENTANYL CITRATE (PF) 100 MCG/2ML IJ SOLN
INTRAMUSCULAR | Status: AC
  Administered 2020-02-23: 100 ug via INTRAVENOUS
  Filled 2020-02-23: qty 2

## 2020-02-23 MED ORDER — ONDANSETRON HCL 4 MG PO TABS: 4.0000 mg | ORAL_TABLET | ORAL | Status: DC | PRN

## 2020-02-23 MED ORDER — COCONUT OIL OIL: 1.0000 "application " | TOPICAL_OIL | Status: DC | PRN

## 2020-02-23 MED ORDER — IBUPROFEN 600 MG PO TABS
600.0000 mg | ORAL_TABLET | Freq: Four times a day (QID) | ORAL | Status: DC
  Filled 2020-02-23 (×6): qty 1

## 2020-02-23 NOTE — Discharge Summary (Signed)
Postpartum Discharge Summary    Patient Name: Paula Riley DOB: [birth year] MRN: [id number]  Date of admission: 02/22/2020 Delivery date:02/23/2020  Delivering provider: Lenna Sciara  Date of discharge: 02/25/2020  Admitting diagnosis: Poor clinical fetal growth in third trimester [O36.5930] Intrauterine pregnancy: [redacted]w[redacted]d    Secondary diagnosis:  Active Problems:   Sinus tachycardia   Anxiety about health   LGSIL on Pap smear of cervix   Anemia   IUGR (intrauterine growth restriction) affecting care of mother   Gestational hypertension, third trimester   Hypokalemia  Additional problems: none    Discharge diagnosis: Term Pregnancy Delivered and Gestational Hypertension                                              Post partum procedures:n/a Augmentation: Pitocin and IP Foley Complications: None  Hospital course: Induction of Labor With Vaginal Delivery   39y.o. yo [id number] admitted to the hospital 02/22/2020 for induction of labor.  Indication for induction: Gestational hypertension and FGR (symmetric FGR dx at 30wks, 2nd% with elevated UAD/ gHTN dx at 36wks). A P/C ratio wasn't collected, but her pre-e labs were neg but show hypokalemia (2.7) for which she received replacement K+ during labor. She had a cervical foley placed upon arrival (late afternoon, due to L&D capacity) along with a steady low rate of Pit started; the foley came out that night and with a slight increase in her Pitocin, progressed to complete and delivered 20 mins later.   Membrane Rupture Time/Date: 3:11 AM ,02/23/2020   Delivery Method:Vaginal, Spontaneous  Episiotomy: None  Lacerations:  None  Details of delivery can be found in separate delivery note.  Patient had a routine postpartum course. Recheck of potassium showed a level of 3.5 after replacement.  Patient is discharged home 02/25/20.  Newborn Data: Birth date:02/23/2020  Birth time:3:11 AM  Gender:Female  Living status:Living   Apgars:8 ,9  Weight:2190 g   Magnesium Sulfate received: No BMZ received: No Rhophylac:N/A MMR:N/A T-DaP:Given prenatally Flu: Yes Transfusion:No  Physical exam  Vitals:   02/24/20 2315 02/25/20 0316 02/25/20 0936 02/25/20 1103  BP: 134/87 127/85 (!) 129/91 124/85  Pulse: 82 86 90 79  Resp: _0 Temp: 98 F (36.7 C) 98.4 F (36.9 C) 98.7 F (37.1 C)   TempSrc: Axillary Axillary Oral   SpO2: 99% 100% 100%   Weight:      Height:       General: alert, cooperative and no distress Lochia: appropriate Uterine Fundus: firm Incision: N/A DVT Evaluation: No evidence of DVT seen on physical exam. Labs: Lab Results  Component Value Date   WBC 12.1 (H) 02/23/2020   HGB 11.4 (L) 02/23/2020   HCT 35.1 (L) 02/23/2020   MCV 97.0 02/23/2020   PLT 239 02/23/2020   CMP Latest Ref Rng & Units 02/23/2020  Glucose 70 - 99 mg/dL 86  BUN 6 - 20 mg/dL <5(L)  Creatinine 0.44 - 1.00 mg/dL 0.58  Sodium 135 - 145 mmol/L 141  Potassium 3.5 - 5.1 mmol/L 3.5  Chloride 98 - 111 mmol/L 109  CO2 22 - 32 mmol/L 20(L)  Calcium 8.9 - 10.3 mg/dL 8.5(L)  Total Protein 6.5 - 8.1 g/dL -  Total Bilirubin 0.3 - 1.2 mg/dL -  Alkaline Phos 38 - 126 U/L -  AST 15 -  41 U/L -  ALT 0 - 44 U/L -   Edinburgh Score: Edinburgh Postnatal Depression Scale Screening Tool 02/23/2020  I have been able to laugh and see the funny side of things. 0  I have looked forward with enjoyment to things. 0  I have blamed myself unnecessarily when things went wrong. 1  I have been anxious or worried for no good reason. 3  I have felt scared or panicky for no good reason. 3  Things have been getting on top of me. 2  I have been so unhappy that I have had difficulty sleeping. 2  I have felt sad or miserable. 1  I have been so unhappy that I have been crying. 1  The thought of harming myself has occurred to me. 0  Edinburgh Postnatal Depression Scale Total 13     After visit meds:  Allergies as of 02/25/2020    No Known Allergies     Medication List    STOP taking these medications   Blood Pressure Monitor Misc   ferrous sulfate 325 (65 FE) MG tablet   metoprolol tartrate 50 MG tablet Commonly known as: LOPRESSOR   omeprazole 20 MG capsule Commonly known as: PriLOSEC   UNISOM PO     TAKE these medications   acetaminophen 500 MG tablet Commonly known as: TYLENOL Take 1,000 mg by mouth every 6 (six) hours as needed for mild pain.   ibuprofen 600 MG tablet Commonly known as: ADVIL Take 1 tablet (600 mg total) by mouth every 6 (six) hours.   metoprolol succinate 100 MG 24 hr tablet Commonly known as: TOPROL-XL Take 1 tablet (100 mg total) by mouth daily. Take with or immediately following a meal. Start taking on: February 26, 2020   multivitamin-prenatal 27-0.8 MG Tabs tablet Take 1 tablet by mouth daily at 12 noon.   NIFEdipine 30 MG 24 hr tablet Commonly known as: ADALAT CC Take 1 tablet (30 mg total) by mouth daily. Start taking on: February 26, 2020   sertraline 25 MG tablet Commonly known as: Zoloft Take 1 tablet (25 mg total) by mouth daily.        Discharge home in stable condition Infant Feeding: Bottle Infant Disposition:home with mother Discharge instruction: per After Visit Summary and Postpartum booklet. Activity: Advance as tolerated. Pelvic rest for 6 weeks.  Diet: routine diet Future Appointments: Future Appointments  Date Time Provider [place]  03/01/2020  2:50 PM [facility] NURSE [facility]  03/29/2020  1:50 PM Cresenzo-Dishmon, Joaquim Lai, CNM [facility]   Follow up Visit:  Follow-up Information    [facility] Follow up.   Specialty: Obstetrics and Gynecology Why: For BP check and postpartum appointment Contact information: [address] [place]              Please schedule this patient for Postpartum visit in: BP check in 1 wk; PP visit in 4 wks with the following  provider: Any provider In-Person For C/S patients schedule nurse incision check in weeks 2 weeks: no High risk pregnancy complicated by: gHTN, FGR Delivery mode:  SVD Anticipated Birth Control:  IUD PP Procedures needed: BP check  Schedule Integrated [place] visit: no  02/25/2020 Wende Mott, CNM

## 2020-02-23 NOTE — Progress Notes (Addendum)
   02/23/20 1048  Vital Signs  BP (!) 146/81  Pulse Rate 93  Pulse Rate Source Dinamap  Resp 19  Temp 98.2 F (36.8 C)  Temp Source Oral  Oxygen Therapy  SpO2 100 %  md notified. Pt feels anxious and not good. MD ok to give all 100 lopressor at same time (not 50/50) and recheck vitals after   Md notified.  Pt anxious about taking the 50 mg of metoprolol Tartrate instead of succinate; md stated pt to take one time dose of the succinate 50 mg and restart on 100 mg succinate tomorrow

## 2020-02-23 NOTE — Progress Notes (Addendum)
   02/23/20 1217  Vital Signs  BP 139/87  Pulse Rate 72  md notified Pt c/o dizziness. refused metoprolol succinate for now. Md ok to monitor.  Pt taken to bathroom w stedy. Refused on the way back. Denies dizziness. States just feels "weird, not good"  Pt states she feels a little better

## 2020-02-23 NOTE — Plan of Care (Signed)
  Problem: Education: Goal: Knowledge of General Education information will improve Description: Including pain rating scale, medication(s)/side effects and non-pharmacologic comfort measures Outcome: Completed/Met   Problem: Health Behavior/Discharge Planning: Goal: Ability to manage health-related needs will improve Outcome: Completed/Met   Problem: Clinical Measurements: Goal: Ability to maintain clinical measurements within normal limits will improve Outcome: Completed/Met Goal: Will remain free from infection Outcome: Completed/Met Goal: Diagnostic test results will improve Outcome: Completed/Met Goal: Respiratory complications will improve Outcome: Completed/Met Goal: Cardiovascular complication will be avoided Outcome: Completed/Met   Problem: Activity: Goal: Risk for activity intolerance will decrease Outcome: Completed/Met   Problem: Nutrition: Goal: Adequate nutrition will be maintained Outcome: Completed/Met   Problem: Coping: Goal: Level of anxiety will decrease Outcome: Completed/Met   Problem: Elimination: Goal: Will not experience complications related to bowel motility Outcome: Completed/Met Goal: Will not experience complications related to urinary retention Outcome: Completed/Met   Problem: Pain Managment: Goal: General experience of comfort will improve Outcome: Completed/Met   Problem: Safety: Goal: Ability to remain free from injury will improve Outcome: Completed/Met   Problem: Skin Integrity: Goal: Risk for impaired skin integrity will decrease Outcome: Completed/Met   Problem: Life Cycle: Goal: Ability to make normal progression through stages of labor will improve Outcome: Completed/Met Goal: Ability to effectively push during vaginal delivery will improve Outcome: Completed/Met   Problem: Role Relationship: Goal: Will demonstrate positive interactions with the child Outcome: Completed/Met   Problem: Safety: Goal: Risk of  complications during labor and delivery will decrease Outcome: Completed/Met   Problem: Pain Management: Goal: Relief or control of pain from uterine contractions will improve Outcome: Completed/Met

## 2020-02-23 NOTE — Progress Notes (Signed)
   02/23/20 1434  Edinburgh Postnatal Depression Scale:  In the Past 7 Days  I have been able to laugh and see the funny side of things. 0  I have looked forward with enjoyment to things. 0  I have blamed myself unnecessarily when things went wrong. 1  I have been anxious or worried for no good reason. 3  I have felt scared or panicky for no good reason. 3  Things have been getting on top of me. 2  I have been so unhappy that I have had difficulty sleeping. 2  I have felt sad or miserable. 1  I have been so unhappy that I have been crying. 1  The thought of harming myself has occurred to me. 0  Edinburgh Postnatal Depression Scale Total 13

## 2020-02-23 NOTE — Progress Notes (Addendum)
Patient ID: Paula Riley, female   DOB: February 26, 1981, 39 y.o.   MRN: [id number]  Paula Riley is a 39 y.o. [id number] at [redacted]w[redacted]d admitted for IOL for FGR and gHTN.  Subjective: Still with some anxiety.  Feeling some contractions, but only rating about 3/10.    Objective: BP (!) 149/70   Pulse 82   Temp 98.4 F (36.9 C) (Axillary)   Resp 16   Ht 5\' 5"  (1.651 m)   Wt 63.7 kg   LMP 06/08/2019 (Exact Date)   SpO2 99%   BMI 23.36 kg/m  No intake/output data recorded.  FHT:  FHR: 130 bpm, variability: moderate,  accelerations:  Present,  decelerations:  Absent UC:   irregular, every 2-5 minutes  SVE:   Dilation: 1.5 Effacement (%): 50 Station: -2 Exam by:: Dr. [email or web address]  Pitocin @ 4 mu/min  Labs: Lab Results  Component Value Date   WBC 7.7 02/22/2020   HGB 12.3 02/22/2020   HCT 37.2 02/22/2020   MCV 95.9 02/22/2020   PLT 264 02/22/2020    Assessment / Plan: 39 y.o. 20 at [redacted]w[redacted]d admitted for IOL for FGR and gHTN.  Labor: FB out at 2145.  Defer cervical check until she is having more intense and consistent contractions.  Continue to titrate pit.  Fetal Wellbeing:  Category I Pain Control:  IV pain meds GHTN: Stable I/D:  GBS neg Anticipated MOD:  SVD  Hypokalemia: Received doses of K+ and K+ added to LR.  Rechecking level at 0500.    Paula Riley 2146, MD PGY-2 Resident Family Medicine 02/23/2020, 12:39 AM

## 2020-02-24 MED ORDER — SERTRALINE HCL 50 MG PO TABS
50.0000 mg | ORAL_TABLET | Freq: Every day | ORAL | Status: DC
  Filled 2020-02-24: qty 1

## 2020-02-24 MED ORDER — LORAZEPAM 0.5 MG PO TABS
0.5000 mg | ORAL_TABLET | Freq: Four times a day (QID) | ORAL | Status: DC | PRN
  Administered 2020-02-25: 0.5 mg via ORAL
  Filled 2020-02-24: qty 1

## 2020-02-24 MED ORDER — NIFEDIPINE ER OSMOTIC RELEASE 30 MG PO TB24
30.0000 mg | ORAL_TABLET | Freq: Every day | ORAL | Status: DC
  Administered 2020-02-24 – 2020-02-25 (×2): 30 mg via ORAL
  Filled 2020-02-24 (×2): qty 1

## 2020-02-24 NOTE — Progress Notes (Signed)
Went to see patient regarding increased anxiety.   Discussed starting Zoloft for long term treatment, she is unsure as she does not want to take daily pills. Has used ativan in the past, discussed we can use PRN doses in house but on discharge will on rx zoloft. Written for zoloft and ativan. Message sent to clinic for [facility] follow up scheduling.   Also reviewed BP's, has been taking home Metop XL 100mg  daily but BP's still uncontrolled, start Nifedipine 30mg  XL.

## 2020-02-24 NOTE — Progress Notes (Signed)
Called to room by RN for patient refusing BP meds and having palpitations  Subjective:  Patient nervous because of history of SVT (on metoprolol). Scored 13 on Edinburgh, but nervous about starting Zoloft as well as Procardia. She does not like taking pills, has multiple questions about medication adjustments.  Objective: Vitals:   02/24/20 1612 02/24/20 2010  BP: (!) 145/94 125/81  Pulse: 84 76  Resp: 17 18  Temp: 98.3 F (36.8 C) 98.1 F (36.7 C)  SpO2:  100%   [place]: Anxious-appearing female Psych: Anxious, mood appropriate, affect normal Lungs: Breathing unlabored Heart: Regular rate and rhythm. Pulses normal. Cap Refill Normal  EKG, independently reviewed: - Normal sinus rhythm at 86 bpm - Normal PR interval - Normal QRS complexes - No ST and T wave changes - Good R wave progression in the precordial leads Interpretation: Normal EKG  A/P: 39 yo G4 now P3013 who is PPD#1 with gHTN and severe anxiety - EKG normal - Discussed adding Procardia to control GHTN as she is having blood pressures persistently >130/>90. Reaffirmed she may not need it forever, but that it is important to keep her blood pressures under control to prevent GHTN from worsening in the postpartum period -Discussed anxiety. She is refusing Zoloft, doesn't think she needs it despite [place] of 13. We discussed that the alternative would be taking Serotonin supplements when she gets home. She needs a mood check in a week, message was sent to [facility]. If the Serotonin supplements don't work, she will be amenable to starting Zoloft at that time.  Marlowe Alt, DO OB Fellow, Faculty Practice 02/24/2020 11:14 PM

## 2020-02-24 NOTE — Progress Notes (Signed)
CSW received consult due to score 13 on Edinburgh Depression Screen as well as hx of anxiety. CSW met with MOB at bedside to address further needs.   CSW congratulated MOB on the birth of infant. CSW advised MOB of CSW's role and the reason for CSW coming to visit with her. MOB reported that she was diagnosed with anxiety 2 years ago. MOB reported that she had medication PRN for her anxiety in which she doesn't take often. MOB reported that she stopped the medication once she found out that she was pregnant. MOB reported a desire to restart medication as her anxiety has been increasing over the past few days. MOB reported that her anxiety is around her health as well as infants health. MOB reported that she spoke with her MD in the past and was supposed to be given resources for therapy but was never given anything. CSW offered MOB therapy resources in which MOB was agreeable to. MOB gave CSW verbal permission to speak with OB Provider to see about getting started on a new medication for her anxiety. MOB reported that she is still worried about things, reason being why she scored 13 on Edinburgh. MOB reported that she was very worried over the last 7 days about "health issues". CSW attempted to clarify what MOB;s fears were however MOB wasn't very specific with CSW aside from "health concerns". CSW spoke with OB on call and was advised that someone would follow up with MOB for medication needs.   CSW inquired from MOB on other mental health hx and MOB denies having any. CSW was notified that MOB has support from FOB and grandparents. MOB reported that she has all needed items to care for infant with no other needs.   CSW provided education regarding Baby Blues vs PMADs and provided MOB with resources for mental health follow up.  CSW encouraged MOB to evaluate her mental health throughout the postpartum period with the use of the New Mom Checklist developed by [facility] as well as the Edinburgh  Postnatal Depression Scale and notify a medical professional if symptoms arise.     Serge Main S. Orva Riles, MSW, LCSW [facility] [phone]   

## 2020-02-24 NOTE — Progress Notes (Addendum)
Post Partum Day 1 Subjective: no complaints, up ad lib, voiding, tolerating PO and + flatus  Objective: Blood pressure (!) 125/92, pulse 71, temperature 98.6 F (37 C), temperature source Oral, resp. rate 17, height '5\' 5"'  (1.651 m), weight 63.7 kg, last menstrual period 06/08/2019, SpO2 100 %, unknown if currently breastfeeding.  Physical Exam:  General: alert, cooperative, appears stated age and no distress Lochia: appropriate Uterine Fundus: firm DVT Evaluation: No evidence of DVT seen on physical exam.  Recent Labs    02/22/20 1644 02/23/20 0705  HGB 12.3 11.4*  HCT 37.2 35.1*    Assessment/Plan: Plan for discharge tomorrow and Circumcision prior to discharge  Elevated BP: Has had some elevated BP since delivery, no severe range.  Will check BP q4h.  Though she does not feel comfortable starting a medication right now, she is agreeable to starting a med if BP remains elevated throughout the day.  Anxiety: Needs outpatient therapy.  Given information on [facility] (online therapy).    LOS: 2 days   EMILY Madelin Headings, MD PGY-2 Resident Family Medicine 02/24/2020, 7:53 AM  I personally saw and evaluated the patient, performing the key elements of the service. I developed and verified the management plan that is described in the resident's/student's note, and I agree with the content with my edits above. VSS, HRR&R, Resp unlabored, Legs neg.  Nigel Berthold, CNM 02/24/2020 8:19 AM

## 2020-02-25 MED ORDER — IBUPROFEN 600 MG PO TABS: 600.0000 mg | ORAL_TABLET | Freq: Four times a day (QID) | ORAL | 0 refills | Status: DC

## 2020-02-25 MED ORDER — METOPROLOL SUCCINATE ER 100 MG PO TB24: 100.0000 mg | ORAL_TABLET | Freq: Every day | ORAL | 0 refills | Status: DC

## 2020-02-25 MED ORDER — NIFEDIPINE ER 30 MG PO TB24: 30.0000 mg | ORAL_TABLET | Freq: Every day | ORAL | 0 refills | Status: DC

## 2020-02-25 MED ORDER — SERTRALINE HCL 25 MG PO TABS: 25.0000 mg | ORAL_TABLET | Freq: Every day | ORAL | 2 refills | Status: DC

## 2020-02-25 NOTE — Progress Notes (Addendum)
At 2210 this RN noticed "overdue" Nifedipine to be given at 1130 that was not charted against and inquired about it with patient. Pt declines that medication was mentioned or offered. BP not well controlled and throughout day have been elevated ranging from 140-147/88-94. RN Tereasa Yilmaz offered Nifedipine and patient became very anxious reporting heart palpitations and having lots of questions. Dr. Salomon Mast called and RN requested OB visit. [place] in route.   Elvia Collum, RN

## 2020-02-25 NOTE — Discharge Instructions (Signed)
[facility] ([email or web address]) for online therapy appointment    Postpartum Hypertension Postpartum hypertension is high blood pressure that remains higher than normal after childbirth. You may not realize that you have postpartum hypertension if your blood pressure is not being checked regularly. In most cases, postpartum hypertension will go away on its own, usually within a week of delivery. However, for some women, medical treatment is required to prevent serious complications, such as seizures or stroke. What are the causes? This condition may be caused by one or more of the following:  Hypertension that existed before pregnancy (chronic hypertension).  Hypertension that comes on as a result of pregnancy (gestational hypertension).  Hypertensive disorders during pregnancy (preeclampsia) or seizures in women who have high blood pressure during pregnancy (eclampsia).  A condition in which the liver, platelets, and red blood cells are damaged during pregnancy (HELLP syndrome).  A condition in which the thyroid produces too much hormones (hyperthyroidism).  Other rare problems of the nerves (neurological disorders) or blood disorders. In some cases, the cause may not be known. What increases the risk? The following factors may make you more likely to develop this condition:  Chronic hypertension. In some cases, this may not have been diagnosed before pregnancy.  Obesity.  Type 2 diabetes.  Kidney disease.  History of preeclampsia or eclampsia.  Other medical conditions that change the level of hormones in the body (hormonal imbalance). What are the signs or symptoms? As with all types of hypertension, postpartum hypertension may not have any symptoms. Depending on how high your blood pressure is, you may experience:  Headaches. These may be mild, moderate, or severe. They may also be steady, constant, or sudden in onset (thunderclap headache).  Changes in your  ability to see (visual changes).  Dizziness.  Shortness of breath.  Swelling of your hands, feet, lower legs, or face. In some cases, you may have swelling in more than one of these locations.  Heart palpitations or a racing heartbeat.  Difficulty breathing while lying down.  Decrease in the amount of urine that you pass. Other rare signs and symptoms may include:  Sweating more than usual. This lasts longer than a few days after delivery.  Chest pain.  Sudden dizziness when you get up from sitting or lying down.  Seizures.  Nausea or vomiting.  Abdominal pain. How is this diagnosed? This condition may be diagnosed based on the results of a physical exam, blood pressure measurements, and blood and urine tests. You may also have other tests, such as a CT scan or an MRI, to check for other problems of postpartum hypertension. How is this treated? If blood pressure is high enough to require treatment, your options may include:  Medicines to reduce blood pressure (antihypertensives). Tell your health care provider if you are breastfeeding or if you plan to breastfeed. There are many antihypertensive medicines that are safe to take while breastfeeding.  Stopping medicines that may be causing hypertension.  Treating medical conditions that are causing hypertension.  Treating the complications of hypertension, such as seizures, stroke, or kidney problems. Your health care provider will also continue to monitor your blood pressure closely until it is within a safe range for you. Follow these instructions at home:  Take over-the-counter and prescription medicines only as told by your health care provider.  Return to your normal activities as told by your health care provider. Ask your health care provider what activities are safe for you.  Do not use any  products that contain nicotine or tobacco, such as cigarettes and e-cigarettes. If you need help quitting, ask your health care  provider.  Keep all follow-up visits as told by your health care provider. This is important. Contact a health care provider if:  Your symptoms get worse.  You have new symptoms, such as: ? A headache that does not get better. ? Dizziness. ? Visual changes. Get help right away if:  You suddenly develop swelling in your hands, ankles, or face.  You have sudden, rapid weight gain.  You develop difficulty breathing, chest pain, racing heartbeat, or heart palpitations.  You develop severe pain in your abdomen.  You have any symptoms of a stroke. "BE FAST" is an easy way to remember the main warning signs of a stroke: ? B - Balance. Signs are dizziness, sudden trouble walking, or loss of balance. ? E - Eyes. Signs are trouble seeing or a sudden change in vision. ? F - Face. Signs are sudden weakness or numbness of the face, or the face or eyelid drooping on one side. ? A - Arms. Signs are weakness or numbness in an arm. This happens suddenly and usually on one side of the body. ? S - Speech. Signs are sudden trouble speaking, slurred speech, or trouble understanding what people say. ? T - Time. Time to call emergency services. Write down what time symptoms started.  You have other signs of a stroke, such as: ? A sudden, severe headache with no known cause. ? Nausea or vomiting. ? Seizure. These symptoms may represent a serious problem that is an emergency. Do not wait to see if the symptoms will go away. Get medical help right away. Call your local emergency services (911 in the [place] Do not drive yourself to the hospital. Summary  Postpartum hypertension is high blood pressure that remains higher than normal after childbirth.  In most cases, postpartum hypertension will go away on its own, usually within a week of delivery.  For some women, medical treatment is required to prevent serious complications, such as seizures or stroke. This information is not intended to replace  advice given to you by your health care provider. Make sure you discuss any questions you have with your health care provider. Document Revised: 10/08/2018 Document Reviewed: 06/22/2017 Elsevier Patient Education  2020 [employer]  Postpartum Care After Vaginal Delivery This sheet gives you information about how to care for yourself from the time you deliver your baby to up to 6-12 weeks after delivery (postpartum period). Your health care provider may also give you more specific instructions. If you have problems or questions, contact your health care provider. Follow these instructions at home: Vaginal bleeding  It is normal to have vaginal bleeding (lochia) after delivery. Wear a sanitary pad for vaginal bleeding and discharge. ? During the first week after delivery, the amount and appearance of lochia is often similar to a menstrual period. ? Over the next few weeks, it will gradually decrease to a dry, yellow-brown discharge. ? For most women, lochia stops completely by 4-6 weeks after delivery. Vaginal bleeding can vary from woman to woman.  Change your sanitary pads frequently. Watch for any changes in your flow, such as: ? A sudden increase in volume. ? A change in color. ? Large blood clots.  If you pass a blood clot from your vagina, save it and call your health care provider to discuss. Do not flush blood clots down the toilet before talking with your health  care provider.  Do not use tampons or douches until your health care provider says this is safe.  If you are not breastfeeding, your period should return 6-8 weeks after delivery. If you are feeding your child breast milk only (exclusive breastfeeding), your period may not return until you stop breastfeeding. Perineal care  Keep the area between the vagina and the anus (perineum) clean and dry as told by your health care provider. Use medicated pads and pain-relieving sprays and creams as directed.  If you had a cut in  the perineum (episiotomy) or a tear in the vagina, check the area for signs of infection until you are healed. Check for: ? More redness, swelling, or pain. ? Fluid or blood coming from the cut or tear. ? Warmth. ? Pus or a bad smell.  You may be given a squirt bottle to use instead of wiping to clean the perineum area after you go to the bathroom. As you start healing, you may use the squirt bottle before wiping yourself. Make sure to wipe gently.  To relieve pain caused by an episiotomy, a tear in the vagina, or swollen veins in the anus (hemorrhoids), try taking a warm sitz bath 2-3 times a day. A sitz bath is a warm water bath that is taken while you are sitting down. The water should only come up to your hips and should cover your buttocks. Breast care  Within the first few days after delivery, your breasts may feel heavy, full, and uncomfortable (breast engorgement). Milk may also leak from your breasts. Your health care provider can suggest ways to help relieve the discomfort. Breast engorgement should go away within a few days.  If you are breastfeeding: ? Wear a bra that supports your breasts and fits you well. ? Keep your nipples clean and dry. Apply creams and ointments as told by your health care provider. ? You may need to use breast pads to absorb milk that leaks from your breasts. ? You may have uterine contractions every time you breastfeed for up to several weeks after delivery. Uterine contractions help your uterus return to its normal size. ? If you have any problems with breastfeeding, work with your health care provider or Advertising copywriter.  If you are not breastfeeding: ? Avoid touching your breasts a lot. Doing this can make your breasts produce more milk. ? Wear a good-fitting bra and use cold packs to help with swelling. ? Do not squeeze out (express) milk. This causes you to make more milk. Intimacy and sexuality  Ask your health care provider when you can  engage in sexual activity. This may depend on: ? Your risk of infection. ? How fast you are healing. ? Your comfort and desire to engage in sexual activity.  You are able to get pregnant after delivery, even if you have not had your period. If desired, talk with your health care provider about methods of birth control (contraception). Medicines  Take over-the-counter and prescription medicines only as told by your health care provider.  If you were prescribed an antibiotic medicine, take it as told by your health care provider. Do not stop taking the antibiotic even if you start to feel better. Activity  Gradually return to your normal activities as told by your health care provider. Ask your health care provider what activities are safe for you.  Rest as much as possible. Try to rest or take a nap while your baby is sleeping. Eating and drinking  Drink enough fluid to keep your urine pale yellow.  Eat high-fiber foods every day. These may help prevent or relieve constipation. High-fiber foods include: ? Whole grain cereals and breads. ? Brown rice. ? Beans. ? Fresh fruits and vegetables.  Do not try to lose weight quickly by cutting back on calories.  Take your prenatal vitamins until your postpartum checkup or until your health care provider tells you it is okay to stop. Lifestyle  Do not use any products that contain nicotine or tobacco, such as cigarettes and e-cigarettes. If you need help quitting, ask your health care provider.  Do not drink alcohol, especially if you are breastfeeding. General instructions  Keep all follow-up visits for you and your baby as told by your health care provider. Most women visit their health care provider for a postpartum checkup within the first 3-6 weeks after delivery. Contact a health care provider if:  You feel unable to cope with the changes that your child brings to your life, and these feelings do not go away.  You feel unusually  sad or worried.  Your breasts become red, painful, or hard.  You have a fever.  You have trouble holding urine or keeping urine from leaking.  You have little or no interest in activities you used to enjoy.  You have not breastfed at all and you have not had a menstrual period for 12 weeks after delivery.  You have stopped breastfeeding and you have not had a menstrual period for 12 weeks after you stopped breastfeeding.  You have questions about caring for yourself or your baby.  You pass a blood clot from your vagina. Get help right away if:  You have chest pain.  You have difficulty breathing.  You have sudden, severe leg pain.  You have severe pain or cramping in your lower abdomen.  You bleed from your vagina so much that you fill more than one sanitary pad in one hour. Bleeding should not be heavier than your heaviest period.  You develop a severe headache.  You faint.  You have blurred vision or spots in your vision.  You have bad-smelling vaginal discharge.  You have thoughts about hurting yourself or your baby. If you ever feel like you may hurt yourself or others, or have thoughts about taking your own life, get help right away. You can go to the nearest emergency department or call:  Your local emergency services (911 in the [place]  A suicide crisis helpline, such as the National Suicide Prevention Lifeline at (615)824-9847. This is open 24 hours a day. Summary  The period of time right after you deliver your newborn up to 6-12 weeks after delivery is called the postpartum period.  Gradually return to your normal activities as told by your health care provider.  Keep all follow-up visits for you and your baby as told by your health care provider. This information is not intended to replace advice given to you by your health care provider. Make sure you discuss any questions you have with your health care provider. Document Revised: 09/04/2017 Document  Reviewed: 06/15/2017 Elsevier Patient Education  2020 [employer].

## 2020-02-25 NOTE — Progress Notes (Signed)
This RN called Dr. Salomon Mast back to room at completion of 12 lead EKG. After conversation with OB, patient agreed to take BP medication tonight. Pt still very anxious though declines multiple offers of Zoloft, Ativan, and Ambien. BP this evening have been lower than daytime with BP and HR at 2010 being 125/81 and 76 bpm and 134/87 and 82 bpm at 2315. Charge RN Dava updated. Will continue to monitor BP Q4 throughout night.   Elvia Collum, RN

## 2020-02-27 ENCOUNTER — Other Ambulatory Visit: Payer: Medicaid Other

## 2020-02-27 LAB — SURGICAL PATHOLOGY

## 2020-03-01 ENCOUNTER — Encounter: Payer: Medicaid Other | Admitting: Obstetrics and Gynecology

## 2020-03-01 ENCOUNTER — Ambulatory Visit (INDEPENDENT_AMBULATORY_CARE_PROVIDER_SITE_OTHER): Payer: Medicaid Other | Admitting: *Deleted

## 2020-03-01 ENCOUNTER — Encounter: Payer: Self-pay | Admitting: *Deleted

## 2020-03-01 ENCOUNTER — Other Ambulatory Visit: Payer: Medicaid Other

## 2020-03-01 VITALS — BP 116/78 | HR 108 | Ht 65.0 in | Wt 125.0 lb

## 2020-03-01 DIAGNOSIS — Z0130 Encounter for examination of blood pressure without abnormal findings: Secondary | ICD-10-CM

## 2020-03-01 NOTE — Progress Notes (Signed)
   NURSE VISIT- BLOOD PRESSURE CHECK  SUBJECTIVE:  Paula Riley is a 39 y.o. [phone] female here for BP check. She is postpartum, delivery date June 10    HYPERTENSION ROS:  Pregnant/postpartum:  . Severe headaches that don't go away with tylenol/other medicines: No  . Visual changes (seeing spots/double/blurred vision) No  . Severe pain under right breast breast or in center of upper chest No  . Severe nausea/vomiting No  . Taking medicines as instructed yes   OBJECTIVE:  BP 116/78 (BP Location: Left Arm, Patient Position: Sitting, Cuff Size: Normal)   Pulse (!) 108   Ht 5\' 5"  (1.651 m)   Wt 125 lb (56.7 kg)   Breastfeeding No   BMI 20.80 kg/m   Appearance alert, well appearing, and in no distress.  ASSESSMENT: Postpartum  blood pressure check  PLAN: Discussed with Dr.   Recommendations: stop adalat 30mg  and follow up in one week for blood pressure check   Follow-up: one week   Emelda Fear  03/01/2020 2:49 PM

## 2020-03-02 ENCOUNTER — Telehealth: Payer: Self-pay | Admitting: Obstetrics and Gynecology

## 2020-03-02 NOTE — Telephone Encounter (Signed)
Spoke with patient. She was not able to recheck her reading for me. Patient was not home. I advised that she recheck when she gets home and if bp is still elevated that she can call after hours nurse for further advice. Patient denied any headaches or blurry vision.

## 2020-03-02 NOTE — Telephone Encounter (Signed)
137/90 patient blood pressure reading from this morning and wants to know if she should take the blood pressure medicine.

## 2020-03-05 ENCOUNTER — Other Ambulatory Visit: Payer: Medicaid Other

## 2020-03-05 ENCOUNTER — Telehealth: Payer: Self-pay | Admitting: Advanced Practice Midwife

## 2020-03-05 NOTE — Telephone Encounter (Signed)
There is always a little bump in BP when you go off meds, for now being off seems to be ok, it will take a little time to drift downward

## 2020-03-05 NOTE — Telephone Encounter (Addendum)
Pt's BP has been high over the weekend. 145/96. Pt was taken off of BP med on Thursday; had BP check here in the office. Has not had headache or blurred vision. Did take a BP pill on Saturday and Sunday, Adalat 30 mg. BP on Saturday was 120/80 after taking BP med. BP today is 114/78; has not taken Adalat today. Please advise. Thanks!! JSY

## 2020-03-05 NOTE — Telephone Encounter (Signed)
Patients blood pressure has been high over the weekend and wants to know if she should continue to take bp medication.

## 2020-03-05 NOTE — Telephone Encounter (Signed)
Left message @ 3:50 pm with Dr. Forestine Chute recommendations. Advised pt to call with any questions. JSY

## 2020-03-07 ENCOUNTER — Telehealth: Payer: Self-pay | Admitting: Advanced Practice Midwife

## 2020-03-07 NOTE — Telephone Encounter (Signed)
Victorino Dike called to give the patient blood pressure and depression score.  Blood pressure was 120/80n in the left arm, she stated the pt told her she isn't taking the medicine that was prescribed to her.  Depression score was 10/30.  Victorino Dike also stated patient isnt taking anything for anxiety.

## 2020-03-08 ENCOUNTER — Encounter: Payer: Self-pay | Admitting: *Deleted

## 2020-03-08 ENCOUNTER — Other Ambulatory Visit: Payer: Medicaid Other

## 2020-03-08 ENCOUNTER — Ambulatory Visit (INDEPENDENT_AMBULATORY_CARE_PROVIDER_SITE_OTHER): Payer: Medicaid Other | Admitting: *Deleted

## 2020-03-08 ENCOUNTER — Encounter: Payer: Medicaid Other | Admitting: Obstetrics & Gynecology

## 2020-03-08 VITALS — BP 132/78 | HR 78 | Ht 65.0 in | Wt 123.0 lb

## 2020-03-08 DIAGNOSIS — Z0130 Encounter for examination of blood pressure without abnormal findings: Secondary | ICD-10-CM

## 2020-03-08 NOTE — Progress Notes (Addendum)
   NURSE VISIT- BLOOD PRESSURE CHECK  SUBJECTIVE:  Paula Riley is a 39 y.o. [phone] female here for BP check. She is postpartum, delivery date June 10.     HYPERTENSION ROS:  Pregnant/postpartum:  . Severe headaches that don't go away with tylenol/other medicines: No  . Visual changes (seeing spots/double/blurred vision) No  . Severe pain under right breast breast or in center of upper chest No  . Severe nausea/vomiting No  . Taking medicines as instructed yes   OBJECTIVE:  BP 132/78 (BP Location: Left Arm, Patient Position: Sitting, Cuff Size: Normal)   Pulse 78   Ht 5\' 5"  (1.651 m)   Wt 123 lb (55.8 kg)   Breastfeeding No   BMI 20.47 kg/m   Appearance alert, well appearing, and in no distress and oriented to person, place, and time.  ASSESSMENT: Postpartum  blood pressure check  PLAN: Discussed with , CNM, [facility]   Recommendations: restart Procardia and stop taking July 13 before postpartum visit.    Follow-up: as scheduled   July 15  03/08/2020 9:25 AM   Chart reviewed for nurse visit. Agree with plan of care.  03/10/2020, Cheral Marker 03/08/2020 11:11 AM

## 2020-03-08 NOTE — Telephone Encounter (Signed)
Spoke with Victorino Dike, postpartum newborn nurse. She feels like pt is overwhelmed and may need med for anxiety. Pt has an appt here this am and will discuss it then. JSY

## 2020-03-09 ENCOUNTER — Telehealth: Payer: Self-pay | Admitting: Obstetrics & Gynecology

## 2020-03-09 NOTE — Telephone Encounter (Signed)
Patient wanted to a nurse to ask a doc, is she suppose to still be taking her iron pills.  She stated if so do need to take them, she will need a refill on them.

## 2020-03-09 NOTE — Telephone Encounter (Signed)
Pt aware she can stop the iron pills per Louie Bun, CNM. JSY

## 2020-03-29 ENCOUNTER — Ambulatory Visit (INDEPENDENT_AMBULATORY_CARE_PROVIDER_SITE_OTHER): Payer: Medicaid Other | Admitting: Advanced Practice Midwife

## 2020-03-29 ENCOUNTER — Encounter: Payer: Self-pay | Admitting: Advanced Practice Midwife

## 2020-03-29 DIAGNOSIS — Z30430 Encounter for insertion of intrauterine contraceptive device: Secondary | ICD-10-CM | POA: Diagnosis not present

## 2020-03-29 DIAGNOSIS — Z3202 Encounter for pregnancy test, result negative: Secondary | ICD-10-CM | POA: Diagnosis not present

## 2020-03-29 DIAGNOSIS — O99013 Anemia complicating pregnancy, third trimester: Secondary | ICD-10-CM

## 2020-03-29 DIAGNOSIS — Z8639 Personal history of other endocrine, nutritional and metabolic disease: Secondary | ICD-10-CM

## 2020-03-29 DIAGNOSIS — Z975 Presence of (intrauterine) contraceptive device: Secondary | ICD-10-CM | POA: Insufficient documentation

## 2020-03-29 LAB — POCT URINE PREGNANCY: Preg Test, Ur: NEGATIVE

## 2020-03-29 MED ORDER — PARAGARD INTRAUTERINE COPPER IU IUD
INTRAUTERINE_SYSTEM | Freq: Once | INTRAUTERINE | Status: AC
  Administered 2020-03-29: 1 via INTRAUTERINE

## 2020-03-29 NOTE — Patient Instructions (Addendum)
IUD PLACEMENT POST-PROCEDURE INSTRUCTIONS  1. You may take Ibuprofen, Aleve or Tylenol for pain if needed.  Cramping should resolve within in 24 hours.  2. You may have a small amount of spotting.  You should wear a mini pad for the next few days  3.    Nothing in the vagina for 3 days (tampons or intercourse)  4.  You need to call if you have a fever (more than 100.4), any moderate to severe pelvic pain, fever, or foul smelling vaginal discharge.  Irregular bleeding is common the first several months after having an IUD placed. You do not need to call for this reason unless you are concerned.  5. Shower or bathe as normal  6.  You should have a follow-up appointment in 4-8 weeks for a re-check to make sure you are not having any problems.   [facility] for online therapy appointments.

## 2020-03-29 NOTE — Progress Notes (Signed)
Post Partum Visit Note  Paula Riley is a 39 y.o. [phone] female who presents for a postpartum visit. She is 6 weeks postpartum following a normal spontaneous vaginal delivery.  I have fully reviewed the prenatal and intrapartum course. The delivery was at 37.1 gestational weeks IOL d/t FGR.  Developed GHTN, placed on meds for a while. Last dose Tuesday.  Anesthesia: epidural. Postpartum course has been uneventful. Baby is doing well. Baby is feeding by bottle - Similac Neosure. Bleeding staining only. Bowel function is normal. Bladder function is normal. Patient is not sexually active. Contraception method is abstinence. Postpartum depression screening: 16.  Was given information about [facility] while in the hospital;  Never called. Info given again.  .    Review of Systems Pertinent items are noted in HPI.   Objective:  Blood pressure 121/79, pulse (!) 105, height 5\' 5"  (1.651 m), weight 118 lb (53.5 kg), not currently breastfeeding.  General:  alert, cooperative, and no distress   Breasts:  negative  Lungs: Normal respiratory effort  Heart:  regular rate and rhythm  Abdomen: soft, non-tender; bowel sounds normal; no masses   Vulva:  normal  Vagina: normal vagina  Cervix:  normal  Corpus: Well involuted  Adnexa:  not evaluated  Rectal Exam: no hemorrhoids        Assessment:    Normal postpartum exam. Pap smear not done at today's visit. (Due December)  Plan:   Essential components of care per ACOG recommendations:  1.  Mood and well being: Patient with positive depression screening today. Reviewed local resources for support.  - Patient does not use tobacco. If using tobacco we discussed reduction and for recently cessation risk of relapse - hx of drug use? No   If yes, discussed support systems  2. Infant care and feeding:  -Patient currently breastmilk feeding? No If breastmilk feeding discussed return to work and pumping. If needed, patient was provided letter for work to allow  for every 2-3 hr pumping breaks, and to be granted a private location to express breastmilk and refrigerated area to store breastmilk. Reviewed importance of draining breast regularly to support lactation. -Social determinants of health (SDOH) reviewed in EPIC. No concerns  3. Sexuality, contraception and birth spacing - Patient does not want a pregnancy in the next year.  Desired family size is 3 children.  - Reviewed forms of contraception in tiered fashion. Patient desired Paragard placed today.   - Discussed birth spacing of 18 months  4. Sleep and fatigue -Encouraged family/partner/community support of 4 hrs of uninterrupted sleep to help with mood and fatigue  5. Physical Recovery  - Discussed patients delivery and complications - Patient has urinary incontinence? No Patient was referred to pelvic floor PT  - Patient is safe to resume physical and sexual activity  6.  Health Maintenance - Last pap smear done 12/20 and was abnormal with LSIL with negative HPV. Needs rpt cotesting in December   The risks and benefits of the method and placement have been thouroughly reviewed with the patient and all questions were answered.  Specifically the patient is aware of failure rate of 09/998, expulsion of the IUD and of possible perforation.  Time out was performed.  A Graves speculum was placed.  The cervix was prepped using Betadine. The uterus was found to be towards the left and it sounded to 9 cm.  The cervix was grasped with a tenaculum and the IUD was inserted to 9 cm.  It was pulled  back 1 cm and the IUD was disengaged.  The strings were trimmed to 3 cm.  Sonogram was performed and the proper placement of the IUD was verified.  The patient was instructed on signs and symptoms of infection and to check for the strings after each menses or each month.  The patient is to refrain from intercourse for 3 days.  The patient is scheduled for a return appointment after her first menses  or 4 weeks.  Jacklyn Shell 03/29/2020 2:08 PM'  Jacklyn Shell, CNM [facility] [employer]

## 2020-03-30 LAB — BASIC METABOLIC PANEL
BUN/Creatinine Ratio: 17 (ref 9–23)
BUN: 13 mg/dL (ref 6–20)
CO2: 24 mmol/L (ref 20–29)
Calcium: 9.4 mg/dL (ref 8.7–10.2)
Chloride: 103 mmol/L (ref 96–106)
Creatinine, Ser: 0.77 mg/dL (ref 0.57–1.00)
GFR calc Af Amer: 113 mL/min/{1.73_m2} (ref >59)
GFR calc non Af Amer: 98 mL/min/{1.73_m2} (ref >59)
Glucose: 83 mg/dL (ref 65–99)
Potassium: 4.6 mmol/L (ref 3.5–5.2)
Sodium: 141 mmol/L (ref 134–144)

## 2020-03-30 LAB — CBC
Hematocrit: 45.7 % (ref 34.0–46.6)
Hemoglobin: 15.4 g/dL (ref 11.1–15.9)
MCH: 31.3 pg (ref 26.6–33.0)
MCHC: 33.7 g/dL (ref 31.5–35.7)
MCV: 93 fL (ref 79–97)
Platelets: 222 10*3/uL (ref 150–450)
RBC: 4.92 x10E6/uL (ref 3.77–5.28)
RDW: 12.5 % (ref 11.7–15.4)
WBC: 4.4 10*3/uL (ref 3.4–10.8)

## 2020-04-12 ENCOUNTER — Telehealth: Payer: Self-pay | Admitting: Cardiology

## 2020-04-12 NOTE — Telephone Encounter (Signed)
Patient wanted to know if she can take ibuprofen. Please advise

## 2020-04-12 NOTE — Telephone Encounter (Signed)
Advised pt that she can check with her PCP... she is not on any cardio meds that it interacts with but she should also check with her OBGYN if she is breastfeeding but she reports that she is not. She is taking it short term for a tooth ache.

## 2020-04-26 ENCOUNTER — Encounter: Payer: Self-pay | Admitting: Advanced Practice Midwife

## 2020-04-26 ENCOUNTER — Ambulatory Visit (INDEPENDENT_AMBULATORY_CARE_PROVIDER_SITE_OTHER): Payer: Medicaid Other | Admitting: Advanced Practice Midwife

## 2020-04-26 VITALS — BP 127/85 | HR 102 | Ht 65.0 in | Wt 122.0 lb

## 2020-04-26 DIAGNOSIS — Z30431 Encounter for routine checking of intrauterine contraceptive device: Secondary | ICD-10-CM

## 2020-04-26 NOTE — Progress Notes (Signed)
History:  39 y.o. Paula Riley here today for today for IUD string check; Paragard IUD was placed  A month ago. No complaints about the IUD, no concerning side effects.  Period on now, perhaps a little heavier but not too bad  The following portions of the patient's history were reviewed and updated as appropriate: allergies, current medications, past family history, past medical history, past social history, past surgical history and problem list.  Review of Systems:   Constitutional: Negative for fever and chills Eyes: Negative for visual disturbances Respiratory: Negative for shortness of breath, dyspnea Cardiovascular: Negative for chest pain or palpitations  Gastrointestinal: Negative for vomiting, diarrhea and constipation Genitourinary: Negative for dysuria and urgency Musculoskeletal: Negative for back pain, joint pain, myalgias  Neurological: Negative for dizziness and headaches    Objective:  Physical Exam Blood pressure 127/85, pulse (!) 102, height 5\' 5"  (1.651 m), weight 122 lb (55.3 kg), last menstrual period 04/22/2020, not currently breastfeeding. Gen: NAD Abd: Soft, nontender and nondistended Pelvic: Bedside 06/22/2020 reveals properly place IUD. Strings visible w/pelvic  Assessment & Plan:  Normal IUD check. Patient to keep IUD in place for 10 years; can come in for removal if she desires pregnancy.   Pap due 12/21

## 2020-05-02 ENCOUNTER — Other Ambulatory Visit: Payer: Self-pay | Admitting: Cardiology

## 2020-05-24 NOTE — Progress Notes (Signed)
Cardiology Clinic Note   Patient Name: [name] Date of Encounter: 05/24/2020  Primary Care Provider:  Avis Epley, PA-C Primary Cardiologist:  Olga Millers, MD  Patient Profile    Paula Riley 39 year old female presents to the clinic today for follow-up evaluation of her palpitations.  Past Medical History    Past Medical History:  Diagnosis Date  . Anxiety   . GERD (gastroesophageal reflux disease)   . Heart murmur   . History of Holter monitoring 06/2010 and 11/2011   Cardionet montior with sinus tachycardia, PAC's only, no arrhythmias either monitor  . Hx of echocardiogram 06/2010   normal  . Mental disorder   . Palpitations   . Pregnancy induced hypertension    Past Surgical History:  Procedure Laterality Date  . DENTAL SURGERY    . INDUCED ABORTION      Allergies  No Known Allergies  History of Present Illness    Paula Riley has a PMH of palpitations, GERD, anxiety, sinus tachycardia, COVID-19, and anemia.  Her prior monitor 10/11 showed no significant arrhythmias.  A CardioNet 3/13 showed sinus to sinus tachycardia with PACs.  Repeat cardiac monitor 3/16 showed sinus with PACs.  Holter monitor 8/19 showed sinus bradycardia, normal sinus rhythm, sinus tachycardia, occasional PACs and rare  PVCs.  Echocardiogram 8/20 showed normal LV function.  She was last seen by Dr. [name] on 11/07/2019 during that time she noted occasional brief palpitations that were not sustained.  She denied dyspnea, chest pain, and syncope.  She was 5 months pregnant and was asking about changing beta-blocker per suggestion from her OB/GYN.  Her metoprolol was transitioned to labetalol at that time.  She presents the clinic today for follow-up evaluation states she was at her primary care office and discussed waking up gasping for air.  She states this happens several times per week and has been present for the last 5 to 6 months.  She states she also suffers from anxiety and  before coming pregnant was medicated for this.  She states that she has not gone back on medication since discontinuing with her last pregnancy.  She states that she is not very physically active but works about 50 hours/week and cares for her 3 children.  She states that she concerns about 40-50 ounces of fluid daily.  She denies caffeine and stimulant use.  I will have her increase her p.o. hydration, order sleep study, and have her follow-up in 3 months.  Today she denies chest pain, shortness of breath, lower extremity edema, fatigue, palpitations, melena, hematuria, hemoptysis, diaphoresis, weakness, presyncope, syncope, orthopnea, and PND.    Home Medications    Prior to Admission medications   Medication Sig Start Date End Date Taking? Authorizing Provider  acetaminophen (TYLENOL) 500 MG tablet Take 1,000 mg by mouth every 6 (six) hours as needed for mild pain. Patient not taking: Reported on 03/29/2020    [provider]  metoprolol succinate (TOPROL-XL) 100 MG 24 hr tablet Take 1 tablet (100 mg total) by mouth daily. Take with or immediately following a meal. 02/26/20   Rolm Bookbinder, CNM  metoprolol succinate (TOPROL-XL) 50 MG 24 hr tablet TAKE 2 TABLETS(100 MG) BY MOUTH DAILY 05/02/20   Lewayne Bunting, MD  Prenatal Vit-Fe Fumarate-FA (MULTIVITAMIN-PRENATAL) 27-0.8 MG TABS tablet Take 1 tablet by mouth daily at 12 noon. Patient not taking: Reported on 04/26/2020    [provider]  famotidine (PEPCID) 20 MG tablet Take 1 tablet (20 mg total)  by mouth 2 (two) times daily. 11/01/11 11/22/11  Ivery Quale, PA-C    Family History    Family History  Problem Relation Age of Onset  . Hypertension Other   . CAD Other   . Diabetes Maternal Grandmother   . Breast cancer Maternal Grandmother   . Healthy Son   . Healthy Sister        x 6  . Healthy Sister        x 2  . Healthy Son    She indicated that her mother is alive. She indicated that her father is alive.  She indicated that all of her three sisters are alive. She indicated that her brother is alive. She indicated that her maternal grandmother is deceased. She indicated that her maternal grandfather is deceased. She indicated that her paternal grandmother is alive. She indicated that her paternal grandfather is deceased. She indicated that both of her sons are alive. She indicated that her maternal aunt is alive. She indicated that her maternal uncle is alive. She indicated that her paternal aunt is alive. She indicated that her paternal uncle is alive. She indicated that the status of her other is unknown.  Social History    Social History   Socioeconomic History  . Marital status: Single    Spouse name: Not on file  . Number of children: 2  . Years of education: 13  . Highest education level: Some college, no degree  Occupational History  . Occupation: [employer]  Tobacco Use  . Smoking status: Never Smoker  . Smokeless tobacco: Never Used  Vaping Use  . Vaping Use: Never used  Substance and Sexual Activity  . Alcohol use: No    Alcohol/week: 0.0 standard drinks  . Drug use: No  . Sexual activity: Yes    Birth control/protection: I.U.D.  Other Topics Concern  . Not on file  Social History Narrative   Lives with 2 children in an apartment on the second floor.     Works at [employer].  Education: high school.   Social Determinants of Health   Financial Resource Strain: Low Risk   . Difficulty of Paying Living Expenses: Not very hard  Food Insecurity: No Food Insecurity  . Worried About Programme researcher, broadcasting/film/video in the Last Year: Never true  . Ran Out of Food in the Last Year: Never true  Transportation Needs: No Transportation Needs  . Lack of Transportation (Medical): No  . Lack of Transportation (Non-Medical): No  Physical Activity:   . Days of Exercise per Week: Not on file  . Minutes of Exercise per Session: Not on file  Stress: Stress Concern Present  . Feeling of Stress : Very much   Social Connections: Moderately Isolated  . Frequency of Communication with Friends and Family: Twice a week  . Frequency of Social Gatherings with Friends and Family: Once a week  . Attends Religious Services: More than 4 times per year  . Active Member of Clubs or Organizations: No  . Attends Banker Meetings: Never  . Marital Status: Never married  Intimate Partner Violence: Not At Risk  . Fear of Current or Ex-Partner: No  . Emotionally Abused: No  . Physically Abused: No  . Sexually Abused: No     Review of Systems    General:  No chills, fever, night sweats or weight changes.  Cardiovascular:  No chest pain, dyspnea on exertion, edema, orthopnea, palpitations, paroxysmal nocturnal dyspnea. Dermatological: No rash, lesions/masses Respiratory: No  cough, dyspnea Urologic: No hematuria, dysuria Abdominal:   No nausea, vomiting, diarrhea, bright red blood per rectum, melena, or hematemesis Neurologic:  No visual changes, wkns, changes in mental status. All other systems reviewed and are otherwise negative except as noted above.  Physical Exam    VS:  There were no vitals taken for this visit. , BMI There is no height or weight on file to calculate BMI. GEN: Well nourished, well developed, in no acute distress. HEENT: normal. Neck: Supple, no JVD, carotid bruits, or masses. Cardiac: RRR, no murmurs, rubs, or gallops. No clubbing, cyanosis, edema.  Radials/DP/PT 2+ and equal bilaterally.  Respiratory:  Respirations regular and unlabored, clear to auscultation bilaterally. GI: Soft, nontender, nondistended, BS + x 4. MS: no deformity or atrophy. Skin: warm and dry, no rash. Neuro:  Strength and sensation are intact. Psych: Normal affect.  Accessory Clinical Findings    Recent Labs: 02/22/2020: ALT 18 02/23/2020: Magnesium 1.4 03/29/2020: BUN 13; Creatinine, Ser 0.77; Hemoglobin 15.4; Platelets 222; Potassium 4.6; Sodium 141   Recent Lipid Panel No results found  for: CHOL, TRIG, HDL, CHOLHDL, VLDL, LDLCALC, LDLDIRECT  ECG personally reviewed by me today-none today.  Echocardiogram 05/12/2019 IMPRESSIONS    1. The left ventricle has normal systolic function, with an ejection  fraction of 55-60%. The cavity size was normal. Left ventricular diastolic  parameters were normal.  2. The right ventricle has normal systolic function. The cavity was  normal. There is no increase in right ventricular wall thickness.  3. The aorta is normal unless otherwise noted.  4. The aortic root is normal in size and structure.  Holter monitor 05/10/2018 Sinus bradycardia, normal sinus rhythm, sinus tachycardia, occasional PAC, rare PVC Olga Millers, MD   Assessment & Plan   1.  OSA-couple times per week for the last 5-6 months she has been waking up gasping for air. Order sleep study Avoid alcohol before bed, sleep aids, and sleeping on back. Increase p.o. hydration  Palpitations-occasional brief episodes of increased heart rate/palpitations.  Echocardiogram 8/20 showed normal LV function.  Last Holter monitor 8/19 showed sinus bradycardia, normal sinus rhythm, sinus tachycardia, occasional PACs and rare PVCs. Continue metoprolol Heart healthy low-sodium diet-salty 6 given Increase physical activity as tolerated Avoid triggers caffeine, chocolate, EtOH etc. Increase p.o. hydration  Atypical chest pain-no chest pain today.  Previous history of atypical chest pain/discomfort.  No plans were made for ischemic work-up. Continue to monitor  Anxiety-patient previously took anxiety medications.  Stopped with becoming pregnant and has not resumed medicine. Mindfulness stress reduction sheet Increase physical activity as tolerated    Disposition: Follow-up with Dr. [name] or me in 3 months.    Thomasene Ripple. Farmer Mccahill NP-C    05/24/2020, 7:11 AM [facility] [address] Office [phone] Fax [phone]  Notice:  This dictation was prepared with Dragon dictation along with smaller phrase technology. Any transcriptional errors that result from this process are unintentional and may not be corrected upon review.

## 2020-05-25 ENCOUNTER — Encounter: Payer: Self-pay | Admitting: General Practice

## 2020-05-25 ENCOUNTER — Other Ambulatory Visit: Payer: Self-pay

## 2020-05-25 ENCOUNTER — Ambulatory Visit (INDEPENDENT_AMBULATORY_CARE_PROVIDER_SITE_OTHER): Payer: Medicaid Other | Admitting: General Practice

## 2020-05-25 VITALS — BP 120/70 | HR 108 | Temp 98.2°F | Ht 65.0 in | Wt 117.0 lb

## 2020-05-25 DIAGNOSIS — R002 Palpitations: Secondary | ICD-10-CM

## 2020-05-25 DIAGNOSIS — F419 Anxiety disorder, unspecified: Secondary | ICD-10-CM

## 2020-05-25 DIAGNOSIS — R0789 Other chest pain: Secondary | ICD-10-CM | POA: Diagnosis not present

## 2020-05-25 DIAGNOSIS — G4733 Obstructive sleep apnea (adult) (pediatric): Secondary | ICD-10-CM

## 2020-05-25 NOTE — Patient Instructions (Signed)
Medication Instructions:  Continue current medications  *If you need a refill on your cardiac medications before your next appointment, please call your pharmacy*   Lab Work: None Ordered   Testing/Procedures: None Ordered   Follow-Up: At [facility], you and your health needs are our priority.  As part of our continuing mission to provide you with exceptional heart care, we have created designated Provider Care Teams.  These Care Teams include your primary Cardiologist (physician) and Advanced Practice Providers (APPs -  Physician Assistants and Nurse Practitioners) who all work together to provide you with the care you need, when you need it.  We recommend signing up for the patient portal called "MyChart".  Sign up information is provided on this After Visit Summary.  MyChart is used to connect with patients for Virtual Visits (Telemedicine).  Patients are able to view lab/test results, encounter notes, upcoming appointments, etc.  Non-urgent messages can be sent to your provider as well.   To learn more about what you can do with MyChart, go to [email or web address].    Your next appointment:   3 month(s)  The format for your next appointment:   In Person  Provider:   You may see Brian Crenshaw, MD or one of the following Advanced Practice Providers on your designated Care Team:    Luke Kilroy, PA-C  Callie Goodrich, PA-C  Jesse Cleaver, FNP     

## 2020-05-28 ENCOUNTER — Telehealth: Payer: Self-pay | Admitting: *Deleted

## 2020-05-28 ENCOUNTER — Other Ambulatory Visit: Payer: Self-pay | Admitting: General Practice

## 2020-05-28 DIAGNOSIS — R0681 Apnea, not elsewhere classified: Secondary | ICD-10-CM

## 2020-05-28 NOTE — Telephone Encounter (Signed)
Left appointment details for home sleep study.

## 2020-05-28 NOTE — Telephone Encounter (Signed)
[employer]  Medicaid sleep study requests are processed through [employer]. In lab sleep study was denied. Patient did not meet qualifications. Request  changed to home sleep study which was approved. Case reference # [id number]. HST Auth # [id number]. Valid dates 05/28/20 to 08/26/20.

## 2020-07-03 ENCOUNTER — Ambulatory Visit: Payer: Medicaid Other | Admitting: Cardiology

## 2020-09-03 ENCOUNTER — Encounter: Payer: Self-pay | Admitting: Adult Health

## 2020-09-03 ENCOUNTER — Ambulatory Visit (INDEPENDENT_AMBULATORY_CARE_PROVIDER_SITE_OTHER): Payer: Medicaid Other | Admitting: Adult Health

## 2020-09-03 ENCOUNTER — Other Ambulatory Visit (HOSPITAL_COMMUNITY)
Admission: RE | Admit: 2020-09-03 | Discharge: 2020-09-03 | Disposition: A | Discharge status: Home / Self Care | Payer: Medicaid Other | Source: Ambulatory Visit | Attending: Adult Health | Admitting: Adult Health

## 2020-09-03 ENCOUNTER — Other Ambulatory Visit: Payer: Self-pay

## 2020-09-03 VITALS — BP 120/78 | HR 105 | Ht 65.0 in | Wt 116.6 lb

## 2020-09-03 DIAGNOSIS — Z01419 Encounter for gynecological examination (general) (routine) without abnormal findings: Secondary | ICD-10-CM | POA: Insufficient documentation

## 2020-09-03 DIAGNOSIS — Z975 Presence of (intrauterine) contraceptive device: Secondary | ICD-10-CM

## 2020-09-03 DIAGNOSIS — Z8742 Personal history of other diseases of the female genital tract: Secondary | ICD-10-CM

## 2020-09-03 DIAGNOSIS — F419 Anxiety disorder, unspecified: Secondary | ICD-10-CM | POA: Insufficient documentation

## 2020-09-03 NOTE — Progress Notes (Signed)
Patient ID: Paula Riley, female   DOB: 30-Apr-1981, 39 y.o.   MRN: [id number] History of Present Illness: Paula Riley is a 39 year old black female,single with DO, [id number] in for a well woman gyn exam and pap. Pap 09/01/2019 was LSIL. PCP is Terie Purser PA   Current Medications, Allergies, Past Medical History, Past Surgical History, Family History and Social History were reviewed in [employer] record.     Review of Systems: Patient denies any headaches, hearing loss, fatigue, blurred vision, shortness of breath, chest pain, abdominal pain, problems with bowel movements, urination, or intercourse. No joint pain or mood swings. Has regular periods with IUD,has paragard.    Physical Exam:BP 120/78 (BP Location: Right Arm, Patient Position: Sitting, Cuff Size: Normal)   Pulse (!) 105   Ht 5\' 5"  (1.651 m)   Wt 116 lb 9.6 oz (52.9 kg)   LMP 08/10/2020 (Exact Date)   Breastfeeding No   BMI 19.40 kg/m  General:  Well developed, well nourished, no acute distress Skin:  Warm and dry Neck:  Midline trachea, normal thyroid, good ROM, no lymphadenopathy Lungs; Clear to auscultation bilaterally Breast:  No dominant palpable mass, retraction, or nipple discharge Cardiovascular: Regular rate and rhythm Abdomen:  Soft, non tender, no hepatosplenomegaly Pelvic:  External genitalia is normal in appearance, no lesions.  The vagina is normal in appearance. Urethra has no lesions or masses. The cervix is bulbous,everted at os. Pap with high risk HPV genotyping performed, IUD strings at os.  Uterus is felt to be normal size, shape, and contour.  No adnexal masses or tenderness noted.Bladder is non tender, no masses felt. Extremities/musculoskeletal:  No swelling or varicosities noted, no clubbing or cyanosis Psych:  No mood changes, alert and cooperative,seems happy AA is 0 Fall risk is low PHQ 9 score is 9 GAD 7 is 13, she says she has anxiety and declines meds, that she is OK   [employer] - 09/03/20 1432      Pregnancy Intention Screening   Does the patient want to become pregnant in the next year? No    Does the patient's partner want to become pregnant in the next year? No    Would the patient like to discuss contraceptive options today? No      Contraception Wrap Up   Current Method IUD or IUS    End Method IUD or IUS    Contraception Counseling Provided No         Examination chaperoned by 09/05/20 RN  Impression and Plan: 1. Encounter for gynecological examination with Papanicolaou smear of cervix Pap sent Physical in 1 year Next pap depends on results   2. IUD (intrauterine device) in place -paragard placed 03/29/20  3. History of abnormal cervical Pap smear Pap sent  4. Anxiety She declines meds, says she is OK

## 2020-09-06 LAB — CYTOLOGY - PAP
Adequacy: ABSENT
Comment: NEGATIVE
Diagnosis: NEGATIVE
High risk HPV: NEGATIVE

## 2020-09-10 NOTE — Progress Notes (Deleted)
HPI: FU palpitations. Previous monitor in October of 2011 showed no significant arrhythmias. Cardionet 3/13 revealed sinus to sinus tachycardia with PACs. Repeat 3/16 showed sinus with pacs.Holter monitor 8/19 showed sinus bradycardia, normal sinus rhythm, sinus tachycardia, occasional PAC and rare PVC. Echocardiogram August 2020 showed normal LV systolic function.Since last seen, she has occasional brief palpitations not sustained.    Current Outpatient Medications  Medication Sig Dispense Refill  . metoprolol succinate (TOPROL-XL) 50 MG 24 hr tablet TAKE 2 TABLETS(100 MG) BY MOUTH DAILY 180 tablet 3  . PARAGARD INTRAUTERINE COPPER IU by Intrauterine route.    Marland Kitchen VITAMIN D PO Take by mouth.     No current facility-administered medications for this visit.     Past Medical History:  Diagnosis Date  . Anxiety   . GERD (gastroesophageal reflux disease)   . Heart murmur   . History of Holter monitoring 06/2010 and 11/2011   Cardionet montior with sinus tachycardia, PAC's only, no arrhythmias either monitor  . Hx of echocardiogram 06/2010   normal  . Mental disorder   . Palpitations   . Pregnancy induced hypertension     Past Surgical History:  Procedure Laterality Date  . DENTAL SURGERY    . INDUCED ABORTION      Social History   Socioeconomic History  . Marital status: Media planner    Spouse name: Not on file  . Number of children: 3  . Years of education: 74  . Highest education level: Some college, no degree  Occupational History  . Occupation: [employer]  Tobacco Use  . Smoking status: Never Smoker  . Smokeless tobacco: Never Used  Vaping Use  . Vaping Use: Never used  Substance and Sexual Activity  . Alcohol use: No    Alcohol/week: 0.0 standard drinks  . Drug use: No  . Sexual activity: Yes    Birth control/protection: I.U.D.  Other Topics Concern  . Not on file  Social History Narrative   Lives with 2 children in an apartment on the second floor.      Works at [employer].  Education: high school.   Social Determinants of Health   Financial Resource Strain: Low Risk   . Difficulty of Paying Living Expenses: Not hard at all  Food Insecurity: No Food Insecurity  . Worried About Programme researcher, broadcasting/film/video in the Last Year: Never true  . Ran Out of Food in the Last Year: Never true  Transportation Needs: No Transportation Needs  . Lack of Transportation (Medical): No  . Lack of Transportation (Non-Medical): No  Physical Activity: Insufficiently Active  . Days of Exercise per Week: 1 day  . Minutes of Exercise per Session: 10 min  Stress: Stress Concern Present  . Feeling of Stress : Very much  Social Connections: Moderately Integrated  . Frequency of Communication with Friends and Family: More than three times a week  . Frequency of Social Gatherings with Friends and Family: Twice a week  . Attends Religious Services: 1 to 4 times per year  . Active Member of Clubs or Organizations: No  . Attends Banker Meetings: Never  . Marital Status: Living with partner  Intimate Partner Violence: Not At Risk  . Fear of Current or Ex-Partner: No  . Emotionally Abused: No  . Physically Abused: No  . Sexually Abused: No    Family History  Problem Relation Age of Onset  . Hypertension Other   . CAD Other   . Diabetes  Maternal Grandmother   . Breast cancer Maternal Grandmother   . Healthy Son   . Healthy Sister        x 6  . Healthy Sister        x 2  . Healthy Son     ROS: no fevers or chills, productive cough, hemoptysis, dysphasia, odynophagia, melena, hematochezia, dysuria, hematuria, rash, seizure activity, orthopnea, PND, pedal edema, claudication. Remaining systems are negative.  Physical Exam: Well-developed well-nourished in no acute distress.  Skin is warm and dry.  HEENT is normal.  Neck is supple.  Chest is clear to auscultation with normal expansion.  Cardiovascular exam is regular rate and rhythm.  Abdominal  exam nontender or distended. No masses palpated. Extremities show no edema. neuro grossly intact  ECG- personally reviewed  A/P  1 palpitations-previous monitor demonstrated an occasional PAC and PVC and symptoms are unchanged.  Continue beta-blocker at present dose.  2 history of atypical chest pain-she has not had recurrent symptoms recently.  3 anxiety-Per primary care.  Olga Millers, MD

## 2020-09-11 ENCOUNTER — Telehealth: Payer: Self-pay

## 2020-09-11 NOTE — Telephone Encounter (Signed)
Pt called and informed of negative pap and to schedule her next one in 3 years, with a well woman visit yearly.

## 2020-09-17 ENCOUNTER — Ambulatory Visit: Payer: Medicaid Other | Admitting: Cardiology

## 2020-09-18 NOTE — Progress Notes (Deleted)
Cardiology Clinic Note   Patient Name: Paula Riley Date of Encounter: 09/18/2020  Primary Care Provider:  Avis Epley, PA-C Primary Cardiologist:  Olga Millers, MD  Patient Profile    ***  Past Medical History    Past Medical History:  Diagnosis Date  . Anxiety   . GERD (gastroesophageal reflux disease)   . Heart murmur   . History of Holter monitoring 06/2010 and 11/2011   Cardionet montior with sinus tachycardia, PAC's only, no arrhythmias either monitor  . Hx of echocardiogram 06/2010   normal  . Mental disorder   . Palpitations   . Pregnancy induced hypertension    Past Surgical History:  Procedure Laterality Date  . DENTAL SURGERY    . INDUCED ABORTION      Allergies  No Known Allergies  History of Present Illness    ***  Home Medications    Prior to Admission medications   Medication Sig Start Date End Date Taking? Authorizing Provider  metoprolol succinate (TOPROL-XL) 50 MG 24 hr tablet TAKE 2 TABLETS(100 MG) BY MOUTH DAILY 05/02/20   Lewayne Bunting, MD  [facility] INTRAUTERINE COPPER IU by Intrauterine route.    [provider]  VITAMIN D PO Take by mouth.    [provider]    Family History    Family History  Problem Relation Age of Onset  . Hypertension Other   . CAD Other   . Diabetes Maternal Grandmother   . Breast cancer Maternal Grandmother   . Healthy Son   . Healthy Sister        x 6  . Healthy Sister        x 2  . Healthy Son    She indicated that her mother is alive. She indicated that her father is alive. She indicated that all of her three sisters are alive. She indicated that her brother is alive. She indicated that her maternal grandmother is deceased. She indicated that her maternal grandfather is deceased. She indicated that her paternal grandmother is alive. She indicated that her paternal grandfather is deceased. She indicated that all of her three sons are alive. She indicated that her maternal  aunt is alive. She indicated that her maternal uncle is alive. She indicated that her paternal aunt is alive. She indicated that her paternal uncle is alive. She indicated that the status of her other is unknown.  Social History    Social History   Socioeconomic History  . Marital status: Media planner    Spouse name: Not on file  . Number of children: 3  . Years of education: 12  . Highest education level: Some college, no degree  Occupational History  . Occupation: [employer]  Tobacco Use  . Smoking status: Never Smoker  . Smokeless tobacco: Never Used  Vaping Use  . Vaping Use: Never used  Substance and Sexual Activity  . Alcohol use: No    Alcohol/week: 0.0 standard drinks  . Drug use: No  . Sexual activity: Yes    Birth control/protection: I.U.D.  Other Topics Concern  . Not on file  Social History Narrative   Lives with 2 children in an apartment on the second floor.     Works at [employer].  Education: high school.   Social Determinants of Health   Financial Resource Strain: Low Risk   . Difficulty of Paying Living Expenses: Not hard at all  Food Insecurity: No Food Insecurity  . Worried About Programme researcher, broadcasting/film/video  in the Last Year: Never true  . Ran Out of Food in the Last Year: Never true  Transportation Needs: No Transportation Needs  . Lack of Transportation (Medical): No  . Lack of Transportation (Non-Medical): No  Physical Activity: Insufficiently Active  . Days of Exercise per Week: 1 day  . Minutes of Exercise per Session: 10 min  Stress: Stress Concern Present  . Feeling of Stress : Very much  Social Connections: Moderately Integrated  . Frequency of Communication with Friends and Family: More than three times a week  . Frequency of Social Gatherings with Friends and Family: Twice a week  . Attends Religious Services: 1 to 4 times per year  . Active Member of Clubs or Organizations: No  . Attends Banker Meetings: Never  . Marital Status:  Living with partner  Intimate Partner Violence: Not At Risk  . Fear of Current or Ex-Partner: No  . Emotionally Abused: No  . Physically Abused: No  . Sexually Abused: No     Review of Systems    General:  No chills, fever, night sweats or weight changes.  Cardiovascular:  No chest pain, dyspnea on exertion, edema, orthopnea, palpitations, paroxysmal nocturnal dyspnea. Dermatological: No rash, lesions/masses Respiratory: No cough, dyspnea Urologic: No hematuria, dysuria Abdominal:   No nausea, vomiting, diarrhea, bright red blood per rectum, melena, or hematemesis Neurologic:  No visual changes, wkns, changes in mental status. All other systems reviewed and are otherwise negative except as noted above.  Physical Exam    VS:  There were no vitals taken for this visit. , BMI There is no height or weight on file to calculate BMI. GEN: Well nourished, well developed, in no acute distress. HEENT: normal. Neck: Supple, no JVD, carotid bruits, or masses. Cardiac: RRR, no murmurs, rubs, or gallops. No clubbing, cyanosis, edema.  Radials/DP/PT 2+ and equal bilaterally.  Respiratory:  Respirations regular and unlabored, clear to auscultation bilaterally. GI: Soft, nontender, nondistended, BS + x 4. MS: no deformity or atrophy. Skin: warm and dry, no rash. Neuro:  Strength and sensation are intact. Psych: Normal affect.  Accessory Clinical Findings    Recent Labs: 02/22/2020: ALT 18 02/23/2020: Magnesium 1.4 03/29/2020: BUN 13; Creatinine, Ser 0.77; Hemoglobin 15.4; Platelets 222; Potassium 4.6; Sodium 141   Recent Lipid Panel No results found for: CHOL, TRIG, HDL, CHOLHDL, VLDL, LDLCALC, LDLDIRECT  ECG personally reviewed by me today- *** - No acute changes  Assessment & Plan   1.  ***   Paula Riley. Tnya Ades NP-C    09/18/2020, 7:31 AM [facility] [address] Office [phone] Fax [phone]  Notice: This dictation was prepared with  Dragon dictation along with smaller phrase technology. Any transcriptional errors that result from this process are unintentional and may not be corrected upon review.

## 2020-09-21 ENCOUNTER — Ambulatory Visit: Payer: Medicaid Other | Admitting: General Practice

## 2020-10-15 NOTE — Progress Notes (Signed)
Cardiology Clinic Note   Patient Name: Paula Riley Date of Encounter: 10/16/2020  Primary Care Provider:  Avis Epley, PA-C Primary Cardiologist:  Olga Millers, MD  Patient Profile    Paula Riley 40 year old female presents to the clinic today for follow-up evaluation of her palpitations.  Past Medical History    Past Medical History:  Diagnosis Date  . Anxiety   . GERD (gastroesophageal reflux disease)   . Heart murmur   . History of Holter monitoring 06/2010 and 11/2011   Cardionet montior with sinus tachycardia, PAC's only, no arrhythmias either monitor  . Hx of echocardiogram 06/2010   normal  . Mental disorder   . Palpitations   . Pregnancy induced hypertension    Past Surgical History:  Procedure Laterality Date  . DENTAL SURGERY    . INDUCED ABORTION      Allergies  No Known Allergies  History of Present Illness    Paula Riley has a PMH of palpitations, GERD, anxiety, sinus tachycardia, COVID-19, and anemia.  Her prior monitor 10/11 showed no significant arrhythmias.  A CardioNet 3/13 showed sinus to sinus tachycardia with PACs.  Repeat cardiac monitor 3/16 showed sinus with PACs.  Holter monitor 8/19 showed sinus bradycardia, normal sinus rhythm, sinus tachycardia, occasional PACs and rare  PVCs.  Echocardiogram 8/20 showed normal LV function.  She was last seen by Dr. [name] on 11/07/2019 during that time she noted occasional brief palpitations that were not sustained.  She denied dyspnea, chest pain, and syncope.  She was 5 months pregnant and was asking about changing beta-blocker per suggestion from her OB/GYN.  Her metoprolol was transitioned to labetalol at that time.  She presented to the clinic 05/25/2020 for follow-up evaluation and  stated she was at her primary care office and discussed waking up gasping for air.  She stated it happened several times per week and had been present for the last 5 to 6 months.  She stated she also suffered from  anxiety and before becoming pregnant was medicated for this.  She stated that she had not gone back on medication since discontinuing with her last pregnancy.  She stated that she was not very physically active but worked about 50 hours/week and cared for her 3 children.  She stated that she consumed about 40-50 ounces of fluid daily.  She denied caffeine and stimulant use.  I encouraged her to increase her p.o. hydration, ordered a sleep study, and planned follow-up in 3 months.  Her sleep study was not performed.  She presents the clinic today for follow-up evaluation states she still notices intermittent bouts of palpitations.  She reports these as occasional and brief.  Her heart rate today is 85 bpm.  Her blood pressure is well controlled at 122/66.  She reports that she tried to coordinate split-night sleep study for her periods of waking up gasping for air.  However, she was not able to coordinate this.  She reports that she continues to have intermittent episodes where she wakes up gasping for air.  We will reorder her sleep study, continue her current medication, and have her follow-up in 6 months.  I will also give her the mindfulness stress reduction sheet.  Today she denies chest pain, shortness of breath, lower extremity edema, fatigue, palpitations, melena, hematuria, hemoptysis, diaphoresis, weakness, presyncope, syncope, orthopnea, and PND.   Home Medications    Prior to Admission medications   Medication Sig Start Date End Date Taking? Authorizing Provider  metoprolol succinate (  TOPROL-XL) 50 MG 24 hr tablet TAKE 2 TABLETS(100 MG) BY MOUTH DAILY 05/02/20   Lewayne Bunting, MD  [facility] INTRAUTERINE COPPER IU by Intrauterine route.    [provider]  VITAMIN D PO Take by mouth.    [provider]    Family History    Family History  Problem Relation Age of Onset  . Hypertension Other   . CAD Other   . Diabetes Maternal Grandmother   . Breast cancer Maternal  Grandmother   . Healthy Son   . Healthy Sister        x 6  . Healthy Sister        x 2  . Healthy Son    She indicated that her mother is alive. She indicated that her father is alive. She indicated that all of her three sisters are alive. She indicated that her brother is alive. She indicated that her maternal grandmother is deceased. She indicated that her maternal grandfather is deceased. She indicated that her paternal grandmother is alive. She indicated that her paternal grandfather is deceased. She indicated that all of her three sons are alive. She indicated that her maternal aunt is alive. She indicated that her maternal uncle is alive. She indicated that her paternal aunt is alive. She indicated that her paternal uncle is alive. She indicated that the status of her other is unknown.  Social History    Social History   Socioeconomic History  . Marital status: Media planner    Spouse name: Not on file  . Number of children: 3  . Years of education: 67  . Highest education level: Some college, no degree  Occupational History  . Occupation: [employer]  Tobacco Use  . Smoking status: Never Smoker  . Smokeless tobacco: Never Used  Vaping Use  . Vaping Use: Never used  Substance and Sexual Activity  . Alcohol use: No    Alcohol/week: 0.0 standard drinks  . Drug use: No  . Sexual activity: Yes    Birth control/protection: I.U.D.  Other Topics Concern  . Not on file  Social History Narrative   Lives with 2 children in an apartment on the second floor.     Works at [employer].  Education: high school.   Social Determinants of Health   Financial Resource Strain: Low Risk   . Difficulty of Paying Living Expenses: Not hard at all  Food Insecurity: No Food Insecurity  . Worried About Programme researcher, broadcasting/film/video in the Last Year: Never true  . Ran Out of Food in the Last Year: Never true  Transportation Needs: No Transportation Needs  . Lack of Transportation (Medical): No  . Lack of  Transportation (Non-Medical): No  Physical Activity: Insufficiently Active  . Days of Exercise per Week: 1 day  . Minutes of Exercise per Session: 10 min  Stress: Stress Concern Present  . Feeling of Stress : Very much  Social Connections: Moderately Integrated  . Frequency of Communication with Friends and Family: More than three times a week  . Frequency of Social Gatherings with Friends and Family: Twice a week  . Attends Religious Services: 1 to 4 times per year  . Active Member of Clubs or Organizations: No  . Attends Banker Meetings: Never  . Marital Status: Living with partner  Intimate Partner Violence: Not At Risk  . Fear of Current or Ex-Partner: No  . Emotionally Abused: No  . Physically Abused: No  . Sexually Abused: No  Review of Systems    General:  No chills, fever, night sweats or weight changes.  Cardiovascular:  No chest pain, dyspnea on exertion, edema, orthopnea, palpitations, paroxysmal nocturnal dyspnea. Dermatological: No rash, lesions/masses Respiratory: No cough, dyspnea Urologic: No hematuria, dysuria Abdominal:   No nausea, vomiting, diarrhea, bright red blood per rectum, melena, or hematemesis Neurologic:  No visual changes, wkns, changes in mental status. All other systems reviewed and are otherwise negative except as noted above.  Physical Exam    VS:  BP 122/66   Pulse 85   Wt 118 lb (53.5 kg)   SpO2 98%   BMI 19.64 kg/m  , BMI Body mass index is 19.64 kg/m. GEN: Well nourished, well developed, in no acute distress, anxious. HEENT: normal. Neck: Supple, no JVD, carotid bruits, or masses. Cardiac: RRR, no murmurs, rubs, or gallops. No clubbing, cyanosis, edema.  Radials/DP/PT 2+ and equal bilaterally.  Respiratory:  Respirations regular and unlabored, clear to auscultation bilaterally. GI: Soft, nontender, nondistended, BS + x 4. MS: no deformity or atrophy. Skin: warm and dry, no rash. Neuro:  Strength and sensation are  intact. Psych: Normal affect.  Accessory Clinical Findings    Recent Labs: 02/22/2020: ALT 18 02/23/2020: Magnesium 1.4 03/29/2020: BUN 13; Creatinine, Ser 0.77; Hemoglobin 15.4; Platelets 222; Potassium 4.6; Sodium 141   Recent Lipid Panel No results found for: CHOL, TRIG, HDL, CHOLHDL, VLDL, LDLCALC, LDLDIRECT  ECG personally reviewed by me today-none today.  Echocardiogram 05/12/2019 IMPRESSIONS    1. The left ventricle has normal systolic function, with an ejection  fraction of 55-60%. The cavity size was normal. Left ventricular diastolic  parameters were normal.  2. The right ventricle has normal systolic function. The cavity was  normal. There is no increase in right ventricular wall thickness.  3. The aorta is normal unless otherwise noted.  4. The aortic root is normal in size and structure.  Holter monitor 05/10/2018 Sinus bradycardia, normal sinus rhythm, sinus tachycardia, occasional PAC, rare PVC Olga Millers, MD  Assessment & Plan   1. Palpitations- only occasional episodes of increased heart rate/palpitations.  Has seen improvement with increased fluid consumption. Echocardiogram 8/20 showed normal LV function.  Last Holter monitor 8/19 showed sinus bradycardia, normal sinus rhythm, sinus tachycardia, occasional PACs and rare PVCs. Continue metoprolol Heart healthy low-sodium diet-salty 6 given Increase physical activity as tolerated Avoid triggers caffeine, chocolate, EtOH, dehydration etc.-reviewed Maintain p.o. hydration  Atypical chest pain-no chest pain today.    No recent episodes of chest discomfort, neck or throat discomfort.  Previous history of atypical chest pain/discomfort.  No plans were made for ischemic work-up. Continue to monitor  Anxiety- anxious on exam today.  Patient previously took anxiety medications.  Stopped with becoming pregnant and has not resumed medicine.  Has noted some improvement with mindfulness stress reduction  techniques. Continue mindfulness stress reduction. Increase physical activity as tolerated  OSA-continues to notice occasional episodes of gasping for air.   Reorder sleep study-educated about benefit Encourage side sleeping Avoid alcohol before bed, sleep aids, and sleeping on back.   Disposition: Follow-up with Dr. [name] or me in 6 months.   Thomasene Ripple. Rutger Salton NP-C    10/16/2020, 10:02 AM [facility] [address] Office [phone] Fax [phone]  Notice: This dictation was prepared with Dragon dictation along with smaller phrase technology. Any transcriptional errors that result from this process are unintentional and may not be corrected upon review.  I spent 15 minutes examining  this patient, reviewing medications, and using patient centered shared decision making involving her cardiac care.  Prior to her visit I spent greater than 20 minutes reviewing her past medical history,  medications, and prior cardiac tests.

## 2020-10-16 ENCOUNTER — Telehealth: Payer: Self-pay | Admitting: General Practice

## 2020-10-16 ENCOUNTER — Encounter: Payer: Self-pay | Admitting: General Practice

## 2020-10-16 ENCOUNTER — Other Ambulatory Visit: Payer: Self-pay

## 2020-10-16 ENCOUNTER — Ambulatory Visit (INDEPENDENT_AMBULATORY_CARE_PROVIDER_SITE_OTHER): Payer: Medicaid Other | Admitting: General Practice

## 2020-10-16 VITALS — BP 122/66 | HR 85 | Wt 118.0 lb

## 2020-10-16 DIAGNOSIS — F419 Anxiety disorder, unspecified: Secondary | ICD-10-CM

## 2020-10-16 DIAGNOSIS — R002 Palpitations: Secondary | ICD-10-CM | POA: Diagnosis not present

## 2020-10-16 DIAGNOSIS — R0789 Other chest pain: Secondary | ICD-10-CM

## 2020-10-16 DIAGNOSIS — G4733 Obstructive sleep apnea (adult) (pediatric): Secondary | ICD-10-CM

## 2020-10-16 NOTE — Patient Instructions (Signed)
Medication Instructions:  The current medical regimen is effective;  continue present plan and medications as directed. Please refer to the Current Medication list given to you today.  *If you need a refill on your cardiac medications before your next appointment, please call your pharmacy*  Lab Work: None  Testing/Procedures: Your physician has recommended that you have a sleep study. This test records several body functions during sleep, including: brain activity, eye movement, oxygen and carbon dioxide blood levels, heart rate and rhythm, breathing rate and rhythm, the flow of air through your mouth and nose, snoring, body muscle movements, and chest and belly movement.  Please try to avoid these triggers:  Do not use any products that have nicotine or tobacco in them. These include cigarettes, e-cigarettes, and chewing tobacco. If you need help quitting, ask your doctor.  Eat heart-healthy foods. Talk with your doctor about the right eating plan for you.  Exercise regularly as told by your doctor.  Stay hydrated  Do not drink alcohol, Caffeine or chocolate.  Lose weight if you are overweight.  Do not use drugs, including cannabis   Special Instructions  PLEASE READ AND FOLLOW STRESS REDUCTION TIPS-ATTACHED-1,800 mg daily  Follow-Up: Your next appointment:  6 month(s) In Person with Olga Millers, MD OR IF UNAVAILABLE JESSE CLEAVER, FNP-C   Please call our office 2 months in advance to schedule this appointment   At [facility], you and your health needs are our priority.  As part of our continuing mission to provide you with exceptional heart care, we have created designated Provider Care Teams.  These Care Teams include your primary Cardiologist (physician) and Advanced Practice Providers (APPs -  Physician Assistants and Nurse Practitioners) who all work together to provide you with the care you need, when you need it.    Mindfulness-Based Stress  Reduction Mindfulness-based stress reduction (MBSR) is a program that helps people learn to practice mindfulness. Mindfulness is the practice of intentionally paying attention to the present moment. MBSR focuses on developing self-awareness, which allows you to respond to life stress without judgment or negative emotions. It can be learned and practiced through techniques such as education, breathing exercises, meditation, and yoga. MBSR includes several mindfulness techniques in one program. MBSR works best when you understand the treatment, are willing to try new things, and can commit to spending time practicing what you learn. MBSR training may include learning about:  How your emotions, thoughts, and reactions affect your body.  New ways to respond to things that cause negative thoughts to start (triggers).  How to notice your thoughts and let go of them.  Practicing awareness of everyday things that you normally do without thinking.  The techniques and goals of different types of meditation. What are the benefits of MBSR? MBSR can have many benefits, which include helping you to:  Develop self-awareness. This refers to knowing and understanding yourself.  Learn skills and attitudes that help you to participate in your own health care.  Learn new ways to care for yourself.  Be more accepting about how things are, and let things go.  Be less judgmental and approach things with an open mind.  Be patient with yourself and trust yourself more. MBSR has also been shown to:  Reduce negative emotions, such as depression and anxiety.  Improve memory and focus.  Change how you sense and approach pain.  Boost your body's ability to fight infections.  Help you connect better with other people.  Improve your sense  of well-being. Follow these instructions at home:  Find a local in-person or online MBSR program.  Set aside some time regularly for mindfulness practice.  Find a  mindfulness practice that works best for you. This may include one or more of the following: ? Meditation. Meditation involves focusing your mind on a certain thought or activity. ? Breathing awareness exercises. These help you to stay present by focusing on your breath. ? Body scan. For this practice, you lie down and pay attention to each part of your body from head to toe. You can identify tension and soreness and intentionally relax parts of your body. ? Yoga. Yoga involves stretching and breathing, and it can improve your ability to move and be flexible. It can also provide an experience of testing your body's limits, which can help you release stress. ? Mindful eating. This way of eating involves focusing on the taste, texture, color, and smell of each bite of food. Because this slows down eating and helps you feel full sooner, it can be an important part of a weight-loss plan.  Find a podcast or recording that provides guidance for breathing awareness, body scan, or meditation exercises. You can listen to these any time when you have a free moment to rest without distractions.  Follow your treatment plan as told by your health care provider. This may include taking regular medicines and making changes to your diet or lifestyle as recommended.   How to practice mindfulness To do a basic awareness exercise:  Find a comfortable place to sit.  Pay attention to the present moment. Observe your thoughts, feelings, and surroundings just as they are.  Avoid placing judgment on yourself, your feelings, or your surroundings. Make note of any judgment that comes up, and let it go.  Your mind may wander, and that is okay. Make note of when your thoughts drift, and return your attention to the present moment. To do basic mindfulness meditation:  Find a comfortable place to sit. This may include a stable chair or a firm floor cushion. ? Sit upright with your back straight. Let your arms fall next to  your side with your hands resting on your legs. ? If sitting in a chair, rest your feet flat on the floor. ? If sitting on a cushion, cross your legs in front of you.  Keep your head in a neutral position with your chin dropped slightly. Relax your jaw and rest the tip of your tongue on the roof of your mouth. Drop your gaze to the floor. You can close your eyes if you like.  Breathe normally and pay attention to your breath. Feel the air moving in and out of your nose. Feel your belly expanding and relaxing with each breath.  Your mind may wander, and that is okay. Make note of when your thoughts drift, and return your attention to your breath.  Avoid placing judgment on yourself, your feelings, or your surroundings. Make note of any judgment or feelings that come up, let them go, and bring your attention back to your breath.  When you are ready, lift your gaze or open your eyes. Pay attention to how your body feels after the meditation. Where to find more information You can find more information about MBSR from:  Your health care provider.  Community-based meditation centers or programs.  Programs offered near you. Summary  Mindfulness-based stress reduction (MBSR) is a program that teaches you how to intentionally pay attention to the present moment.  It is used with other treatments to help you cope better with daily stress, emotions, and pain.  MBSR focuses on developing self-awareness, which allows you to respond to life stress without judgment or negative emotions.  MBSR programs may involve learning different mindfulness practices, such as breathing exercises, meditation, yoga, body scan, or mindful eating. Find a mindfulness practice that works best for you, and set aside time for it on a regular basis. This information is not intended to replace advice given to you by your health care provider. Make sure you discuss any questions you have with your health care  provider. Document Revised: 05/18/2020 Document Reviewed: 05/18/2020 Elsevier Patient Education  2021 [employer].

## 2020-10-16 NOTE — Telephone Encounter (Signed)
Auth # [id number] Valid from 10/16/20 - 01/17/21

## 2020-11-07 ENCOUNTER — Telehealth: Payer: Self-pay | Admitting: Cardiology

## 2020-11-07 NOTE — Telephone Encounter (Signed)
Pt c/o swelling: STAT is pt has developed SOB within 24 hours  1) How much weight have you gained and in what time span? Not sure  2) If swelling, where is the swelling located? Both ankles  3) Are you currently taking a fluid pill? no  4) Are you currently SOB? no  5) Do you have a log of your daily weights (if so, list)? no  6) Have you gained 3 pounds in a day or 5 pounds in a week? no  7) Have you traveled recently? no   Patient states she has swelling in her ankles. She states she is not sure if she has gained weight from the swelling. She states she also get hot in her whole body and has sweating at times. Please advise.

## 2020-11-07 NOTE — Telephone Encounter (Signed)
Spoke to patient she stated she noticed swelling in ankles yesterday.No sob.Weight stable.This morning no swelling.Advised of low salt diet.Advised to monitor and call back if she has any swelling.

## 2020-11-26 ENCOUNTER — Ambulatory Visit (HOSPITAL_COMMUNITY): Payer: Medicaid Other | Admitting: Clinical

## 2020-11-26 ENCOUNTER — Other Ambulatory Visit: Payer: Self-pay

## 2020-11-26 ENCOUNTER — Telehealth (HOSPITAL_COMMUNITY): Payer: Self-pay | Admitting: Clinical

## 2020-11-26 NOTE — Telephone Encounter (Signed)
The pt did not respond to video link, phone call, or VM 

## 2020-12-09 ENCOUNTER — Ambulatory Visit: Payer: Medicaid Other | Attending: General Practice | Admitting: Cardiovascular Disease

## 2020-12-09 ENCOUNTER — Other Ambulatory Visit: Payer: Self-pay

## 2020-12-09 DIAGNOSIS — R0681 Apnea, not elsewhere classified: Secondary | ICD-10-CM

## 2021-01-21 ENCOUNTER — Other Ambulatory Visit (HOSPITAL_COMMUNITY): Payer: Self-pay | Admitting: Physician Assistant

## 2021-01-21 ENCOUNTER — Other Ambulatory Visit: Payer: Self-pay

## 2021-01-21 ENCOUNTER — Ambulatory Visit (HOSPITAL_COMMUNITY)
Admission: RE | Admit: 2021-01-21 | Discharge: 2021-01-21 | Disposition: A | Discharge status: Home / Self Care | Payer: Medicaid Other | Source: Ambulatory Visit | Attending: Physician Assistant | Admitting: Physician Assistant

## 2021-01-21 DIAGNOSIS — R591 Generalized enlarged lymph nodes: Secondary | ICD-10-CM | POA: Insufficient documentation

## 2021-02-13 ENCOUNTER — Telehealth: Payer: Self-pay | Admitting: Cardiology

## 2021-02-13 ENCOUNTER — Other Ambulatory Visit: Payer: Self-pay

## 2021-02-13 ENCOUNTER — Encounter: Payer: Self-pay | Admitting: Physician Assistant

## 2021-02-13 ENCOUNTER — Ambulatory Visit (INDEPENDENT_AMBULATORY_CARE_PROVIDER_SITE_OTHER): Payer: Medicaid Other | Admitting: Physician Assistant

## 2021-02-13 VITALS — BP 118/66 | HR 96 | Ht 65.0 in | Wt 111.4 lb

## 2021-02-13 DIAGNOSIS — M79606 Pain in leg, unspecified: Secondary | ICD-10-CM | POA: Diagnosis not present

## 2021-02-13 DIAGNOSIS — R064 Hyperventilation: Secondary | ICD-10-CM

## 2021-02-13 DIAGNOSIS — R002 Palpitations: Secondary | ICD-10-CM | POA: Diagnosis not present

## 2021-02-13 NOTE — Progress Notes (Signed)
Cardiology Office Note:    Date:  02/14/2021   ID:  Paula Riley, DOB [birth year], MRN [id number]  PCP:  Ladon Applebaum   [facility] Providers Cardiologist:  Olga Millers, MD     Referring MD: Avis Epley, [place]*   Chief Complaint  Patient presents with  . Follow-up    Seen for Dr. [name]    History of Present Illness:    Paula Riley is a 40 y.o. female with a hx of palpitation, GERD, anxiety, sinus tachycardia, COVID-19 and anemia.  Previous heart monitor in October 2011 showed no significant arrhythmia.  CardioNet in March 2013 shows sinus to sinus tachycardia with PACs.  Repeat heart monitor in March 2016 showed sinus rhythm with PACs.  Holter monitor in August 2019 showed sinus bradycardia, normal sinus rhythm, sinus tachycardia and occasional PACs and rare PVCs.  Echocardiogram in August 2020 showed normal EF.  Patient has been followed by Dr. [name] for palpitation.  During her pregnancy, metoprolol was switched to labetalol.  She was previously evaluated by [name], NP for waking up gasping for air, sleep study was ordered however this was never done.  She was last seen by [name], NP on 10/16/2020 for palpitation.  Patient is presents today for 22-month follow-up.  On exam, she still has occasional irregular heartbeat, this is likely PVCs.  She is still working.  She denies any recent chest pain or worsening dyspnea.  She has been having some right leg pain.  She notices the pain mainly with walking.  On physical exam, her dorsalis pedis pulse on the right side is weaker than the left side.  Given her young age, suspicion for severe PAD is quite low, however patient seems to be quite concerned about the leg pain and mentions it is severe when it occurs.  Palpation of the area does not reproduce the pain.  I will go ahead and order lower extremity arterial Doppler to make sure she does not have any arterial occlusion.  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    Past Medical History:  Diagnosis Date  . Anxiety   . GERD (gastroesophageal reflux disease)   . Heart murmur   . History of Holter monitoring 06/2010 and 11/2011   Cardionet montior with sinus tachycardia, PAC's only, no arrhythmias either monitor  . Hx of echocardiogram 06/2010   normal  . Mental disorder   . Palpitations   . Pregnancy induced hypertension     Past Surgical History:  Procedure Laterality Date  . DENTAL SURGERY    . INDUCED ABORTION      Current Medications: Current Meds  Medication Sig  . metoprolol succinate (TOPROL-XL) 50 MG 24 hr tablet TAKE 2 TABLETS(100 MG) BY MOUTH DAILY  . PARAGARD INTRAUTERINE COPPER IU by Intrauterine route.  . Vitamin D, Ergocalciferol, (DRISDOL) 1.25 MG (50000 UNIT) CAPS capsule Take by mouth.     Allergies:   Patient has no known allergies.   Social History   Socioeconomic History  . Marital status: Media planner    Spouse name: Not on file  . Number of children: 3  . Years of education: 11  . Highest education level: Some college, no degree  Occupational History  . Occupation: [employer]  Tobacco Use  . Smoking status: Never Smoker  . Smokeless tobacco: Never Used  Vaping Use  . Vaping Use: Never used  Substance and Sexual Activity  .  Alcohol use: No    Alcohol/week: 0.0 standard drinks  . Drug use: No  . Sexual activity: Yes    Birth control/protection: I.U.D.  Other Topics Concern  . Not on file  Social History Narrative   Lives with 2 children in an apartment on the second floor.     Works at [employer].  Education: high school.   Social Determinants of Health   Financial Resource Strain: Low Risk   . Difficulty of Paying Living Expenses: Not hard at all  Food Insecurity: No Food Insecurity  . Worried About Programme researcher, broadcasting/film/video in the Last Year: Never true  . Ran Out of Food in the Last Year: Never true  Transportation Needs: No Transportation  Needs  . Lack of Transportation (Medical): No  . Lack of Transportation (Non-Medical): No  Physical Activity: Insufficiently Active  . Days of Exercise per Week: 1 day  . Minutes of Exercise per Session: 10 min  Stress: Stress Concern Present  . Feeling of Stress : Very much  Social Connections: Moderately Integrated  . Frequency of Communication with Friends and Family: More than three times a week  . Frequency of Social Gatherings with Friends and Family: Twice a week  . Attends Religious Services: 1 to 4 times per year  . Active Member of Clubs or Organizations: No  . Attends Banker Meetings: Never  . Marital Status: Living with partner     Family History: The patient's family history includes Breast cancer in her maternal grandmother; CAD in an other family member; Diabetes in her maternal grandmother; Healthy in her sister, sister, son, and son; Hypertension in an other family member.  ROS:   Please see the history of present illness.     All other systems reviewed and are negative.  EKGs/Labs/Other Studies Reviewed:    The following studies were reviewed today:  Echo 05/12/2019 1. The left ventricle has normal systolic function, with an ejection  fraction of 55-60%. The cavity size was normal. Left ventricular diastolic  parameters were normal.  2. The right ventricle has normal systolic function. The cavity was  normal. There is no increase in right ventricular wall thickness.  3. The aorta is normal unless otherwise noted.  4. The aortic root is normal in size and structure.    EKG:  EKG is not ordered today.   Recent Labs: 02/22/2020: ALT 18 02/23/2020: Magnesium 1.4 03/29/2020: BUN 13; Creatinine, Ser 0.77; Hemoglobin 15.4; Platelets 222; Potassium 4.6; Sodium 141  Recent Lipid Panel No results found for: CHOL, TRIG, HDL, CHOLHDL, VLDL, LDLCALC, LDLDIRECT   Risk Assessment/Calculations:       Physical Exam:    VS:  BP 118/66   Pulse 96    Ht 5\' 5"  (1.651 m)   Wt 111 lb 6.4 oz (50.5 kg)   SpO2 99%   BMI 18.54 kg/m     Wt Readings from Last 3 Encounters:  02/13/21 111 lb 6.4 oz (50.5 kg)  10/16/20 118 lb (53.5 kg)  09/03/20 116 lb 9.6 oz (52.9 kg)     GEN:  Well nourished, well developed in no acute distress HEENT: Normal NECK: No JVD; No carotid bruits LYMPHATICS: No lymphadenopathy CARDIAC: RRR, no murmurs, rubs, gallops RESPIRATORY:  Clear to auscultation without rales, wheezing or rhonchi  ABDOMEN: Soft, non-tender, non-distended MUSCULOSKELETAL:  No edema; No deformity  SKIN: Warm and dry NEUROLOGIC:  Alert and oriented x 3 PSYCHIATRIC:  Normal affect   ASSESSMENT:    1.  Pain of lower extremity, unspecified laterality   2. Palpitations   3. Breathing abnormally deep    PLAN:    In order of problems listed above:  1. Lower extremity pain: Patient complains of severe pain in the right lower extremity, that is worse with walking and better with rest.  Although symptoms sounds like claudication, she does not really have any risk factors for significant peripheral arterial disease.  Given the severity of her symptoms, we decided to order a lower extremity arterial Doppler.  Suspicion for DVT very low since she is still working and frequently moves around.  Although if symptoms persist despite peripheral arterial ABI/Doppler, may consider venous Doppler in the future.  2. Palpitation: Fairly controlled on the current therapy.  3. Breathing issue: Previously she is described episodes of gasping for air at night, home sleep study was ordered, however as result is still not available.  I reached out to our sleep study coordinator, test is currently pending review by MD.  We will need to reach out to Dr. Tresa Endo.        Medication Adjustments/Labs and Tests Ordered: Current medicines are reviewed at length with the patient today.  Concerns regarding medicines are outlined above.  Orders Placed This Encounter   Procedures  . VAS [place] ABI WITH/WO TBI  . VAS [place] LOWER EXTREMITY ARTERIAL DUPLEX   No orders of the defined types were placed in this encounter.   Patient Instructions  Medication Instructions:  Your physician recommends that you continue on your current medications as directed. Please refer to the Current Medication list given to you today.  *If you need a refill on your cardiac medications before your next appointment, please call your pharmacy*  Lab Work: NONE ordered at this time of appointment   If you have labs (blood work) drawn today and your tests are completely normal, you will receive your results only by: Marland Kitchen MyChart Message (if you have MyChart) OR . A paper copy in the mail If you have any lab test that is abnormal or we need to change your treatment, we will call you to review the results.   Testing/Procedures: Your physician has requested that you have an ankle brachial index (ABI). During this test an ultrasound and blood pressure cuff are used to evaluate the arteries that supply the arms and legs with blood. Allow thirty minutes for this exam. There are no restrictions or special instructions.   Please schedule for 1-2 weeks    Follow-Up: At [facility], you and your health needs are our priority.  As part of our continuing mission to provide you with exceptional heart care, we have created designated Provider Care Teams.  These Care Teams include your primary Cardiologist (physician) and Advanced Practice Providers (APPs -  Physician Assistants and Nurse Practitioners) who all work together to provide you with the care you need, when you need it.  Your next appointment:   6 month(s)  The format for your next appointment:   In Person  Provider:   Olga Millers, MD or [name], FNP  Other Instructions      Signed, Azalee Course, PA  02/14/2021 12:48 PM    [facility]

## 2021-02-13 NOTE — Telephone Encounter (Signed)
Patient called an needed to talk to Dr. [name] or nurse. Please call back

## 2021-02-13 NOTE — Telephone Encounter (Signed)
Spoke with pt, she was just seen by [facility] and she has decreased pulse in her right leg and she was wondering if she needed to go to the ER. Patient states that we are doing a scan of her leg on the 20 th but according to her chart her scan is tomorrow. The patient thanked me for the information and did not need anything else at this time.

## 2021-02-13 NOTE — Patient Instructions (Signed)
Medication Instructions:  Your physician recommends that you continue on your current medications as directed. Please refer to the Current Medication list given to you today.  *If you need a refill on your cardiac medications before your next appointment, please call your pharmacy*  Lab Work: NONE ordered at this time of appointment   If you have labs (blood work) drawn today and your tests are completely normal, you will receive your results only by: Marland Kitchen MyChart Message (if you have MyChart) OR . A paper copy in the mail If you have any lab test that is abnormal or we need to change your treatment, we will call you to review the results.   Testing/Procedures: Your physician has requested that you have an ankle brachial index (ABI). During this test an ultrasound and blood pressure cuff are used to evaluate the arteries that supply the arms and legs with blood. Allow thirty minutes for this exam. There are no restrictions or special instructions.   Please schedule for 1-2 weeks    Follow-Up: At [facility], you and your health needs are our priority.  As part of our continuing mission to provide you with exceptional heart care, we have created designated Provider Care Teams.  These Care Teams include your primary Cardiologist (physician) and Advanced Practice Providers (APPs -  Physician Assistants and Nurse Practitioners) who all work together to provide you with the care you need, when you need it.  Your next appointment:   6 month(s)  The format for your next appointment:   In Person  Provider:   Olga Millers, MD or [name], FNP  Other Instructions

## 2021-02-14 ENCOUNTER — Encounter: Payer: Self-pay | Admitting: Physician Assistant

## 2021-02-14 ENCOUNTER — Ambulatory Visit (HOSPITAL_COMMUNITY)
Admission: RE | Admit: 2021-02-14 | Discharge: 2021-02-14 | Disposition: A | Discharge status: Home / Self Care | Payer: Medicaid Other | Source: Ambulatory Visit | Attending: Physician Assistant | Admitting: Physician Assistant

## 2021-02-14 DIAGNOSIS — M79606 Pain in leg, unspecified: Secondary | ICD-10-CM | POA: Insufficient documentation

## 2021-02-14 NOTE — Progress Notes (Signed)
I have personally called the patient and gave her to result. Also informed the patient that we are still waiting on Dr. Tresa Endo to finish reading her sleep study. Dr. Tresa Endo, please inform the patient her sleep study result once you finish reading it.

## 2021-02-25 ENCOUNTER — Ambulatory Visit (INDEPENDENT_AMBULATORY_CARE_PROVIDER_SITE_OTHER): Payer: Medicaid Other | Admitting: Adult Health

## 2021-02-25 ENCOUNTER — Encounter: Payer: Self-pay | Admitting: Adult Health

## 2021-02-25 ENCOUNTER — Other Ambulatory Visit: Payer: Self-pay

## 2021-02-25 ENCOUNTER — Other Ambulatory Visit (HOSPITAL_COMMUNITY)
Admission: RE | Admit: 2021-02-25 | Discharge: 2021-02-25 | Disposition: A | Discharge status: Home / Self Care | Payer: Medicaid Other | Source: Ambulatory Visit | Attending: Adult Health | Admitting: Adult Health

## 2021-02-25 VITALS — BP 115/77 | HR 105 | Ht 65.0 in | Wt 110.0 lb

## 2021-02-25 DIAGNOSIS — N921 Excessive and frequent menstruation with irregular cycle: Secondary | ICD-10-CM | POA: Diagnosis not present

## 2021-02-25 DIAGNOSIS — Z975 Presence of (intrauterine) contraceptive device: Secondary | ICD-10-CM

## 2021-02-25 DIAGNOSIS — Z113 Encounter for screening for infections with a predominantly sexual mode of transmission: Secondary | ICD-10-CM | POA: Insufficient documentation

## 2021-02-25 NOTE — Progress Notes (Signed)
  Subjective:     Patient ID: Paula Riley, female   DOB: 11/07/1980, 40 y.o.   MRN: [id number]  HPI Paula Riley is a 40 year old black female, with DP, [id number], in complaining of bleeding between periods, has ParaGard IUD. PCP is Terie Purser PA.   Lab Results  Component Value Date   DIAGPAP  09/03/2020    - Negative for intraepithelial lesion or malignancy (NILM)   HPVHIGH Negative 09/03/2020     Review of Systems Bleeding between periods with IUD Denies any pain or pain with sex  Reviewed past medical,surgical, social and family history. Reviewed medications and allergies.     Objective:   Physical Exam BP 115/77 (BP Location: Left Arm, Patient Position: Sitting, Cuff Size: Normal)   Pulse (!) 105   Ht 5\' 5"  (1.651 m)   Wt 110 lb (49.9 kg)   BMI 18.30 kg/m  Skin warm and dry.Pelvic: external genitalia is normal in appearance no lesions, vagina: scant discharge without odor,urethra has no lesions or masses noted, cervix:bulbous, +IUD string at os,uterus: normal size, shape and contour, non tender, no masses felt, adnexa: no masses or tenderness noted. Bladder is non tender and no masses felt.CV swab obtained. Fall risk is low PHQ 2 score is 0   Upstream - 02/25/21 1422       Pregnancy Intention Screening   Does the patient want to become pregnant in the next year? No    Does the patient's partner want to become pregnant in the next year? No    Would the patient like to discuss contraceptive options today? No      Contraception Wrap Up   Current Method IUD or IUS    End Method IUD or IUS    Contraception Counseling Provided No            Examination chaperoned by 02/27/21 RN     Assessment:     1. Irregular intermenstrual bleeding CV swab sent for GC/CHL,trich,BV and yeast   2. IUD (intrauterine device) in place   3. Screening examination for STD (sexually transmitted disease) CV swab sent    Plan:     Follow up prn

## 2021-02-27 LAB — CERVICOVAGINAL ANCILLARY ONLY
Bacterial Vaginitis (gardnerella): NEGATIVE
Candida Glabrata: NEGATIVE
Candida Vaginitis: NEGATIVE
Chlamydia: NEGATIVE
Comment: NEGATIVE
Comment: NEGATIVE
Comment: NEGATIVE
Comment: NEGATIVE
Comment: NEGATIVE
Comment: NORMAL
Neisseria Gonorrhea: NEGATIVE
Trichomonas: NEGATIVE

## 2021-03-04 ENCOUNTER — Encounter (HOSPITAL_COMMUNITY): Payer: Medicaid Other

## 2021-03-25 ENCOUNTER — Telehealth: Payer: Self-pay | Admitting: Cardiology

## 2021-03-25 MED ORDER — METOPROLOL SUCCINATE ER 50 MG PO TB24: ORAL_TABLET | ORAL | 3 refills | Status: DC

## 2021-03-25 NOTE — Telephone Encounter (Signed)
*  STAT* If patient is at the pharmacy, call can be transferred to refill team.   1. Which medications need to be refilled? (please list name of each medication and dose if known)  metoprolol succinate (TOPROL-XL) 50 MG 24 hr tablet  2. Which pharmacy/location (including street and city if local pharmacy) is medication to be sent to? [id number] - [place], [place] - [address]   3. Do they need a 30 day or 90 day supply?  90 day supply  Patient states she is completely out of medication.

## 2021-04-29 ENCOUNTER — Ambulatory Visit
Admission: EM | Admit: 2021-04-29 | Discharge: 2021-04-29 | Disposition: A | Discharge status: Home / Self Care | Payer: Medicaid Other

## 2021-04-29 ENCOUNTER — Ambulatory Visit: Payer: Medicaid Other | Admitting: General Practice

## 2021-04-29 ENCOUNTER — Other Ambulatory Visit: Payer: Self-pay

## 2021-04-29 ENCOUNTER — Encounter: Payer: Self-pay | Admitting: Emergency Medicine

## 2021-04-29 DIAGNOSIS — M25472 Effusion, left ankle: Secondary | ICD-10-CM | POA: Diagnosis not present

## 2021-04-29 DIAGNOSIS — M25471 Effusion, right ankle: Secondary | ICD-10-CM

## 2021-04-29 DIAGNOSIS — I83819 Varicose veins of unspecified lower extremities with pain: Secondary | ICD-10-CM

## 2021-04-29 NOTE — ED Provider Notes (Signed)
[facility]    CSN: [id number] Arrival date & time: 04/29/21  1442      History   Chief Complaint No chief complaint on file.   HPI Paula Riley is a 40 y.o. female.   HPI Patient concern for possible blood clot involving right lower leg. She has experienced bilateral ankle swelling and palpated a hardened area on the vein of lower right leg is concerned for blood clot. No visible swelling at present. Notices mostly following shifts at work. She stands for prolonged periods of time and has noticed pronounced venous patters involving her feet. She has discussed with primary doctor. She was encouraged to wear compression socks.  Past Medical History:  Diagnosis Date   Anxiety    GERD (gastroesophageal reflux disease)    Heart murmur    History of Holter monitoring 06/2010 and 11/2011   Cardionet montior with sinus tachycardia, PAC's only, no arrhythmias either monitor   Hx of echocardiogram 06/2010   normal   Mental disorder    Palpitations    Pregnancy induced hypertension     Patient Active Problem List   Diagnosis Date Noted   Irregular intermenstrual bleeding 02/25/2021   History of abnormal cervical Pap smear 09/03/2020   Encounter for gynecological examination with Papanicolaou smear of cervix 09/03/2020   Anxiety 09/03/2020   IUD (intrauterine device) in place 03/29/2020   Anemia 12/15/2019   COVID-19 09/19/2019   LGSIL on Pap smear of cervix 09/19/2019   Breast cyst, right 10/15/2017   Anxiety about health 05/03/2015   Sinus tachycardia 08/28/2014   Palpitations 12/02/2011   GERD (gastroesophageal reflux disease) 11/13/2011   Abdominal pain, other specified site 11/13/2011    Past Surgical History:  Procedure Laterality Date   DENTAL SURGERY     INDUCED ABORTION      OB History     Gravida  4   Para  3   Term  3   Preterm      AB  1   Living  3      SAB      IAB  1   Ectopic      Multiple  0   Live Births  3             Home Medications    Prior to Admission medications   Medication Sig Start Date End Date Taking? Authorizing Provider  metoprolol succinate (TOPROL-XL) 50 MG 24 hr tablet Take with or immediately following a meal. 03/25/21   Crenshaw, Madolyn Frieze, MD  PARAGARD INTRAUTERINE COPPER IU by Intrauterine route.    [provider]  Vitamin D, Ergocalciferol, (DRISDOL) 1.25 MG (50000 UNIT) CAPS capsule Take by mouth. 08/23/20   [provider]    Family History Family History  Problem Relation Age of Onset   Hypertension Other    CAD Other    Diabetes Maternal Grandmother    Breast cancer Maternal Grandmother    Healthy Son    Healthy Sister        x 6   Healthy Sister        x 2   Healthy Son     Social History Social History   Tobacco Use   Smoking status: Never   Smokeless tobacco: Never  Vaping Use   Vaping Use: Never used  Substance Use Topics   Alcohol use: No    Alcohol/week: 0.0 standard drinks   Drug use: No     Allergies   Patient  has no known allergies.   Review of Systems Review of Systems Pertinent negatives listed in HPI  Physical Exam Triage Vital Signs ED Triage Vitals  Enc Vitals Group     BP 04/29/21 1808 129/67     Pulse Rate 04/29/21 1808 (!) 103     Resp 04/29/21 1808 18     Temp 04/29/21 1808 98.9 F (37.2 C)     Temp Source 04/29/21 1808 Oral     SpO2 04/29/21 1808 99 %     Weight --      Height --      Head Circumference --      Peak Flow --      Pain Score 04/29/21 1809 0     Pain Loc --      Pain Edu? --      Excl. in GC? --    No data found.  Updated Vital Signs BP 129/67 (BP Location: Right Arm)   Pulse (!) 103   Temp 98.9 F (37.2 C) (Oral)   Resp 18   SpO2 99%   Visual Acuity Right Eye Distance:   Left Eye Distance:   Bilateral Distance:    Right Eye Near:   Left Eye Near:    Bilateral Near:     Physical Exam General appearance: Alert, well developed, well nourished, cooperative and in  no distress Head: Normocephalic, without obvious abnormality, atraumatic Respiratory: Respirations even and unlabored, normal respiratory rate Heart: Rate and rhythm normal. No gallop or murmurs noted on exam  Extremities: BLE varicosities present. No gross deformities Skin: Skin color, texture, turgor normal. No rashes seen  Psych: Anxious mood and normal affect. Neurologic: GCS 15, normal coordination, normal gait  UC Treatments / Results  Labs (all labs ordered are listed, but only abnormal results are displayed) Labs Reviewed - No data to display  EKG   Radiology No results found.  Procedures Procedures (including critical care time)  Medications Ordered in UC Medications - No data to display  Initial Impression / Assessment and Plan / UC Course  I have reviewed the triage vital signs and the nursing notes.  Pertinent labs & imaging results that were available during my care of the patient were reviewed by me and considered in my medical decision making (see chart for details).    Swelling of both ankles and varicose veins  Recommend compression socks  Elevated feet while sitting  Low suspicion for DVT.  BLE appearence is unremarkable. ER precaution and PCP follow-up as needed. Final Clinical Impressions(s) / UC Diagnoses   Final diagnoses:  Swelling of both ankles  Varicose veins with pain     Discharge Instructions      Hydrate well with fluids.   Purchase compression socks to wear while working. Tenderness noted in the lower right leg at vein is not consistent with blood clot. Likely is inflammatory and will resolve with warm compresses, antiinflammatories, and compression socks.      ED Prescriptions   None    PDMP not reviewed this encounter.   Bing Neighbors, FNP 05/05/21 2155

## 2021-04-29 NOTE — Discharge Instructions (Addendum)
Hydrate well with fluids.   Purchase compression socks to wear while working. Tenderness noted in the lower right leg at vein is not consistent with blood clot. Likely is inflammatory and will resolve with warm compresses, antiinflammatories, and compression socks.

## 2021-04-29 NOTE — ED Triage Notes (Addendum)
Intermittent pain to RT lower shin x 2days.  Denies any injury

## 2021-07-14 NOTE — Progress Notes (Signed)
Cardiology Clinic Note   Patient Name: Paula Riley Date of Encounter: 07/16/2021  Primary Care Provider:  Avis Epley, PA-C Primary Cardiologist:  Paula Millers, MD  Patient Profile    Paula Riley 40 year old female presents to the clinic today for follow-up evaluation of her palpitations and lower extremity pain.  Past Medical History    Past Medical History:  Diagnosis Date   Anxiety    GERD (gastroesophageal reflux disease)    Heart murmur    History of Holter monitoring 06/2010 and 11/2011   Cardionet montior with sinus tachycardia, PAC's only, no arrhythmias either monitor   Hx of echocardiogram 06/2010   normal   Mental disorder    Palpitations    Pregnancy induced hypertension    Past Surgical History:  Procedure Laterality Date   DENTAL SURGERY     INDUCED ABORTION      Allergies  No Known Allergies  History of Present Illness    [name] has a PMH of palpitations, GERD, anxiety, sinus tachycardia, COVID-19, and anemia.  Her prior monitor 10/11 showed no significant arrhythmias.  A CardioNet 3/13 showed sinus to sinus tachycardia with PACs.  Repeat cardiac monitor 3/16 showed sinus with PACs.  Holter monitor 8/19 showed sinus bradycardia, normal sinus rhythm, sinus tachycardia, occasional PACs and rare  PVCs.  Echocardiogram 8/20 showed normal LV function.  She was last seen by Dr. Jens Riley on 11/07/2019 during that time she noted occasional brief palpitations that were not sustained.  She denied dyspnea, chest pain, and syncope.  She was 5 months pregnant and was asking about changing beta-blocker per suggestion from her OB/GYN.  Her metoprolol was transitioned to labetalol at that time.   She presented to the clinic 05/25/2020 for follow-up evaluation and  stated she was at her primary care office and discussed waking up gasping for air.  She stated it happened several times per week and had been present for the last 5 to 6 months.  She stated she also  suffered from anxiety and before becoming pregnant was medicated for this.  She stated that she had not gone back on medication since discontinuing with her last pregnancy.  She stated that she was not very physically active but worked about 50 hours/week and cared for her 3 children.  She stated that she consumed about 40-50 ounces of fluid daily.  She denied caffeine and stimulant use.  I encouraged her to increase her p.o. hydration, ordered a sleep study, and planned follow-up in 3 months.  Her sleep study was not performed.   She presented to the clinic 10/16/2020 for follow-up evaluation stated she still noticed intermittent bouts of palpitations.  She reported these were occasional and brief.  Her heart rate was 85 bpm.  Her blood pressure was well controlled at 122/66.  She reported that she tried to coordinate split-night sleep study for her periods of waking up gasping for air.  However, she was not able to coordinate this.  She reported that she continues to have intermittent episodes where she would wake up  gasping for air.  We reordered her sleep study, continued her current medications, and planned follow-up for 6 months.  I will also gave her the mindfulness stress reduction sheet.   She was seen by Paula Course, PA-C on 02/13/2021.  She complained of pain in her right lower extremity.  There was low suspicion for DVT.  However due to her concern and pain lower extremity ABIs/Dopplers were ordered.  She  was noted to have mild bilateral lower extremity disease.  She presents to the clinic today for follow-up evaluation states she continues to work over 60 hours at [employer] per week.  We reviewed her ABIs and she expressed understanding.  I asked her about her lipid panel and if she had ever had one drawn.  We reviewed cholesterol and the importance of high-fiber diet.  I will request her lipid panel from her PCP, give her high-fiber diet information, have her increase her physical activity as tolerated,  and plan follow-up for 6 months.  Today she denies chest pain, shortness of breath, lower extremity edema, fatigue, palpitations, melena, hematuria, hemoptysis, diaphoresis, weakness, presyncope, syncope, orthopnea, and PND.  Home Medications    Prior to Admission medications   Medication Sig Start Date End Date Taking? Authorizing Provider  metoprolol succinate (TOPROL-XL) 50 MG 24 hr tablet Take with or immediately following a meal. 03/25/21   Riley, Paula Frieze, MD  PARAGARD INTRAUTERINE COPPER IU by Intrauterine route.    [provider]  Vitamin D, Ergocalciferol, (DRISDOL) 1.25 MG (50000 UNIT) CAPS capsule Take by mouth. 08/23/20   [provider]    Family History    Family History  Problem Relation Age of Onset   Hypertension Other    CAD Other    Diabetes Maternal Grandmother    Breast cancer Maternal Grandmother    Healthy Son    Healthy Sister        x 6   Healthy Sister        x 2   Healthy Son    She indicated that her mother is alive. She indicated that her father is alive. She indicated that all of her three sisters are alive. She indicated that her brother is alive. She indicated that her maternal grandmother is deceased. She indicated that her maternal grandfather is deceased. She indicated that her paternal grandmother is alive. She indicated that her paternal grandfather is deceased. She indicated that all of her three sons are alive. She indicated that her maternal aunt is alive. She indicated that her maternal uncle is alive. She indicated that her paternal aunt is alive. She indicated that her paternal uncle is alive. She indicated that the status of her other is unknown.  Social History    Social History   Socioeconomic History   Marital status: Media planner    Spouse name: Not on file   Number of children: 3   Years of education: 13   Highest education level: Some college, no degree  Occupational History   Occupation: [employer]   Tobacco Use   Smoking status: Never   Smokeless tobacco: Never  Vaping Use   Vaping Use: Never used  Substance and Sexual Activity   Alcohol use: No    Alcohol/week: 0.0 standard drinks   Drug use: No   Sexual activity: Yes    Birth control/protection: I.U.D.  Other Topics Concern   Not on file  Social History Narrative   Lives with 2 children in an apartment on the second floor.     Works at [employer].  Education: high school.   Social Determinants of Health   Financial Resource Strain: Low Risk    Difficulty of Paying Living Expenses: Not hard at all  Food Insecurity: No Food Insecurity   Worried About Programme researcher, broadcasting/film/video in the Last Year: Never true   Ran Out of Food in the Last Year: Never true  Transportation Needs: No Transportation Needs  Lack of Transportation (Medical): No   Lack of Transportation (Non-Medical): No  Physical Activity: Insufficiently Active   Days of Exercise per Week: 1 day   Minutes of Exercise per Session: 10 min  Stress: Stress Concern Present   Feeling of Stress : Very much  Social Connections: Moderately Integrated   Frequency of Communication with Friends and Family: More than three times a week   Frequency of Social Gatherings with Friends and Family: Twice a week   Attends Religious Services: 1 to 4 times per year   Active Member of [employer] or Organizations: No   Attends Engineer, structural: Never   Marital Status: Living with partner  Intimate Partner Violence: Not At Risk   Fear of Current or Ex-Partner: No   Emotionally Abused: No   Physically Abused: No   Sexually Abused: No     Review of Systems    General:  No chills, fever, night sweats or weight changes.  Cardiovascular:  No chest pain, dyspnea on exertion, edema, orthopnea, palpitations, paroxysmal nocturnal dyspnea. Dermatological: No rash, lesions/masses Respiratory: No cough, dyspnea Urologic: No hematuria, dysuria Abdominal:   No nausea, vomiting, diarrhea,  bright red blood per rectum, melena, or hematemesis Neurologic:  No visual changes, wkns, changes in mental status. All other systems reviewed and are otherwise negative except as noted above.  Physical Exam    VS:  BP 130/70 (BP Location: Right Arm)   Pulse 85   Ht 5\' 5"  (1.651 m)   Wt 111 lb 12.8 oz (50.7 kg)   SpO2 95%   BMI 18.60 kg/m  , BMI Body mass index is 18.6 kg/m. GEN: Well nourished, well developed, in no acute distress. HEENT: normal. Neck: Supple, no JVD, carotid bruits, or masses. Cardiac: RRR, no murmurs, rubs, or gallops. No clubbing, cyanosis, edema.  Radials/DP/PT 2+ and equal bilaterally.  Respiratory:  Respirations regular and unlabored, clear to auscultation bilaterally. GI: Soft, nontender, nondistended, BS + x 4. MS: no deformity or atrophy. Skin: warm and dry, no rash. Neuro:  Strength and sensation are intact. Psych: Normal affect.  Accessory Clinical Findings    Recent Labs: No results found for requested labs within last 8760 hours.   Recent Lipid Panel No results found for: CHOL, TRIG, HDL, CHOLHDL, VLDL, LDLCALC, LDLDIRECT  ECG personally reviewed by me today-EKG today shows normal sinus rhythm 85 bpm  Echocardiogram 05/12/2019  IMPRESSIONS     1. The left ventricle has normal systolic function, with an ejection  fraction of 55-60%. The cavity size was normal. Left ventricular diastolic  parameters were normal.   2. The right ventricle has normal systolic function. The cavity was  normal. There is no increase in right ventricular wall thickness.   3. The aorta is normal unless otherwise noted.   4. The aortic root is normal in size and structure.   Holter monitor 05/10/2018 Sinus bradycardia, normal sinus rhythm, sinus tachycardia, occasional PAC, rare PVC 05/12/2018, MD  Lower extremity ABI-02/15/2019 Unable to obtain toe pressures due to patient constant moving/ tremors of  feet.     Summary:  Right: Resting right ankle-brachial  index indicates mild right lower  extremity arterial disease.   Left: Resting left ankle-brachial index indicates mild right lower  extremity arterial disease.      *See table(s) above for measurements and observations.   Assessment & Plan   1.  Palpitations- occasional episodes.  Continues to work full-time at 04/17/2019.  Maintaining hydration.  Echocardiogram 8/20 showed  normal LV function.  Last Holter monitor 8/19 details above. Continue metoprolol Heart healthy low-sodium diet-salty 6 given Increase physical activity as tolerated Avoid triggers caffeine, chocolate, EtOH, dehydration etc.-reviewed Maintain p.o. hydration   Atypical chest pain-  Previously with history of atypical chest pain/discomfort.  No plans were made for ischemic work-up. Continue to monitor   Anxiety-continues to be anxious/nervous during exam.  Patient previously took anxiety medications.  Off antianxiety medication due to pregnancy.  Continues with mindfulness stress reduction.   Continue mindfulness stress reduction. Increase physical activity as tolerated   Lower extremity pain-resolved.  Lower extremity ABIs showed bilateral mild arterial disease.  Did have some lower extremity swelling a couple months ago which has resolved.  Normal Heart healthy low-sodium high-fiber diet Increase physical activity as tolerated  Health promotion-we will request lipid panel from PCP   Disposition: Follow-up with Dr. Jens Riley or me in 6 months.   Thomasene Ripple. Amna Welker NP-C    07/16/2021, 11:42 AM [facility] [address] Office [phone] Fax [phone]  Notice: This dictation was prepared with Dragon dictation along with smaller phrase technology. Any transcriptional errors that result from this process are unintentional and may not be corrected upon review.  I spent 14 minutes examining this patient, reviewing medications, and using patient centered shared decision making  involving her cardiac care.  Prior to her visit I spent greater than 20 minutes reviewing her past medical history,  medications, and prior cardiac tests.

## 2021-07-15 ENCOUNTER — Other Ambulatory Visit: Payer: Self-pay

## 2021-07-15 ENCOUNTER — Other Ambulatory Visit (INDEPENDENT_AMBULATORY_CARE_PROVIDER_SITE_OTHER): Payer: Medicaid Other

## 2021-07-15 DIAGNOSIS — R399 Unspecified symptoms and signs involving the genitourinary system: Secondary | ICD-10-CM

## 2021-07-15 LAB — POCT URINALYSIS DIPSTICK OB
Blood, UA: NEGATIVE
Glucose, UA: NEGATIVE
Ketones, UA: NEGATIVE
Leukocytes, UA: NEGATIVE
Nitrite, UA: NEGATIVE
POC,PROTEIN,UA: NEGATIVE

## 2021-07-15 NOTE — Progress Notes (Signed)
Chart reviewed for nurse visit. Agree with plan of care.  Adline Potter, NP 07/15/2021 10:25 AM

## 2021-07-15 NOTE — Progress Notes (Signed)
   NURSE VISIT- UTI SYMPTOMS   SUBJECTIVE:  Paula Riley is a 40 y.o. [phone] female here for UTI symptoms. She is a GYN patient. She reports urinary frequency and urinary urgency.  OBJECTIVE:  There were no vitals taken for this visit.  Appears well, in no apparent distress  Results for orders placed or performed in visit on 07/15/21 (from the past 24 hour(s))  POC Urinalysis Dipstick OB   Collection Time: 07/15/21  8:51 AM  Result Value Ref Range   Color, UA     Clarity, UA     Glucose, UA Negative Negative   Bilirubin, UA     Ketones, UA neg    Spec Grav, UA     Blood, UA neg    pH, UA     POC,PROTEIN,UA Negative Negative, Trace, Small (1+), Moderate (2+), Large (3+), 4+   Urobilinogen, UA     Nitrite, UA neg    Leukocytes, UA Negative Negative   Appearance     Odor      ASSESSMENT: GYN patient with UTI symptoms and negative nitrites  PLAN: Discussed with Cyril Mourning, AGNP   Rx sent by provider today: No Urine culture sent Call or return to clinic prn if these symptoms worsen or fail to improve as anticipated. Follow-up: as needed   Masud Holub A Esker Dever  07/15/2021 8:52 AM

## 2021-07-16 ENCOUNTER — Encounter: Payer: Self-pay | Admitting: General Practice

## 2021-07-16 ENCOUNTER — Ambulatory Visit (INDEPENDENT_AMBULATORY_CARE_PROVIDER_SITE_OTHER): Payer: Medicaid Other | Admitting: General Practice

## 2021-07-16 VITALS — BP 130/70 | HR 85 | Ht 65.0 in | Wt 111.8 lb

## 2021-07-16 DIAGNOSIS — R0789 Other chest pain: Secondary | ICD-10-CM | POA: Diagnosis not present

## 2021-07-16 DIAGNOSIS — R002 Palpitations: Secondary | ICD-10-CM | POA: Diagnosis not present

## 2021-07-16 DIAGNOSIS — M79606 Pain in leg, unspecified: Secondary | ICD-10-CM | POA: Diagnosis not present

## 2021-07-16 DIAGNOSIS — F419 Anxiety disorder, unspecified: Secondary | ICD-10-CM | POA: Diagnosis not present

## 2021-07-16 LAB — URINALYSIS, ROUTINE W REFLEX MICROSCOPIC
Bilirubin, UA: NEGATIVE
Glucose, UA: NEGATIVE
Ketones, UA: NEGATIVE
Nitrite, UA: NEGATIVE
Protein,UA: NEGATIVE
RBC, UA: NEGATIVE
Specific Gravity, UA: 1.015 (ref 1.005–1.030)
Urobilinogen, Ur: 0.2 mg/dL (ref 0.2–1.0)
pH, UA: 6 (ref 5.0–7.5)

## 2021-07-16 LAB — MICROSCOPIC EXAMINATION
Casts: NONE SEEN /lpf
RBC, Urine: NONE SEEN /hpf (ref 0–2)

## 2021-07-16 NOTE — Patient Instructions (Signed)
Medication Instructions:  The current medical regimen is effective;  continue present plan and medications as directed. Please refer to the Current Medication list given to you today.   *If you need a refill on your cardiac medications before your next appointment, please call your pharmacy*  Lab Work:   Testing/Procedures:  NONE    NONE  Special Instructions PLEASE READ AND FOLLOW SALTY 6-ATTACHED-1,800mg  daily  PLEASE INCREASE PHYSICAL ACTIVITY AS TOLERATED,   PLEASE READ AND FOLLOW INCREASED FIBER DIET-ATTACHED  Follow-Up: Your next appointment:  6 month(s) In Person with [name], FNP   Please call our office 2 months in advance to schedule this appointment   At [facility], you and your health needs are our priority.  As part of our continuing mission to provide you with exceptional heart care, we have created designated Provider Care Teams.  These Care Teams include your primary Cardiologist (physician) and Advanced Practice Providers (APPs -  Physician Assistants and Nurse Practitioners) who all work together to provide you with the care you need, when you need it.     High-Fiber Eating Plan Fiber, also called dietary fiber, is a type of carbohydrate. It is found foods such as fruits, vegetables, whole grains, and beans. A high-fiber diet can have many health benefits. Your health care provider may recommend a high-fiber diet to help: Prevent constipation. Fiber can make your bowel movements more regular. Lower your cholesterol. Relieve the following conditions: Inflammation of veins in the anus (hemorrhoids). Inflammation of specific areas of the digestive tract (uncomplicated diverticulosis). A problem of the large intestine, also called the colon, that sometimes causes pain and diarrhea (irritable bowel syndrome, or IBS). Prevent overeating as part of a weight-loss plan. Prevent heart disease, type 2 diabetes, and certain cancers. What are tips for following this  plan? Reading food labels  Check the nutrition facts label on food products for the amount of dietary fiber. Choose foods that have 5 grams of fiber or more per serving. The goals for recommended daily fiber intake include: Men (age 32 or younger): 34-38 g. Men (over age 27): 28-34 g. Women (age 30 or younger): 25-28 g. Women (over age 77): 22-25 g. Your daily fiber goal is _____________ g. Shopping Choose whole fruits and vegetables instead of processed forms, such as apple juice or applesauce. Choose a wide variety of high-fiber foods such as avocados, lentils, oats, and kidney beans. Read the nutrition facts label of the foods you choose. Be aware of foods with added fiber. These foods often have high sugar and sodium amounts per serving. Cooking Use whole-grain flour for baking and cooking. Cook with brown rice instead of white rice. Meal planning Start the day with a breakfast that is high in fiber, such as a cereal that contains 5 g of fiber or more per serving. Eat breads and cereals that are made with whole-grain flour instead of refined flour or white flour. Eat brown rice, bulgur wheat, or millet instead of white rice. Use beans in place of meat in soups, salads, and pasta dishes. Be sure that half of the grains you eat each day are whole grains. General information You can get the recommended daily intake of dietary fiber by: Eating a variety of fruits, vegetables, grains, nuts, and beans. Taking a fiber supplement if you are not able to take in enough fiber in your diet. It is better to get fiber through food than from a supplement. Gradually increase how much fiber you consume. If you  increase your intake of dietary fiber too quickly, you may have bloating, cramping, or gas. Drink plenty of water to help you digest fiber. Choose high-fiber snacks, such as berries, raw vegetables, nuts, and popcorn. What foods should I eat? Fruits Berries. Pears. Apples. Oranges. Avocado.  Prunes and raisins. Dried figs. Vegetables Sweet potatoes. Spinach. Kale. Artichokes. Cabbage. Broccoli. Cauliflower. Green peas. Carrots. Squash. Grains Whole-grain breads. Multigrain cereal. Oats and oatmeal. Brown rice. Barley. Bulgur wheat. Millet. Quinoa. Bran muffins. Popcorn. Rye wafer crackers. Meats and other proteins Navy beans, kidney beans, and pinto beans. Soybeans. Split peas. Lentils. Nuts and seeds. Dairy Fiber-fortified yogurt. Beverages Fiber-fortified soy milk. Fiber-fortified orange juice. Other foods Fiber bars. The items listed above may not be a complete list of recommended foods and beverages. Contact a dietitian for more information. What foods should I avoid? Fruits Fruit juice. Cooked, strained fruit. Vegetables Fried potatoes. Canned vegetables. Well-cooked vegetables. Grains White bread. Pasta made with refined flour. White rice. Meats and other proteins Fatty cuts of meat. Fried chicken or fried fish. Dairy Milk. Yogurt. Cream cheese. Sour cream. Fats and oils Butters. Beverages Soft drinks. Other foods Cakes and pastries. The items listed above may not be a complete list of foods and beverages to avoid. Talk with your dietitian about what choices are best for you. Summary Fiber is a type of carbohydrate. It is found in foods such as fruits, vegetables, whole grains, and beans. A high-fiber diet has many benefits. It can help to prevent constipation, lower blood cholesterol, aid weight loss, and reduce your risk of heart disease, diabetes, and certain cancers. Increase your intake of fiber gradually. Increasing fiber too quickly may cause cramping, bloating, and gas. Drink plenty of water while you increase the amount of fiber you consume. The best sources of fiber include whole fruits and vegetables, whole grains, nuts, seeds, and beans. This information is not intended to replace advice given to you by your health care provider. Make sure you  discuss any questions you have with your health care provider. Document Revised: 01/05/2020 Document Reviewed: 01/05/2020 Elsevier Patient Education  2022 [employer].

## 2021-07-19 LAB — URINE CULTURE

## 2021-08-20 ENCOUNTER — Other Ambulatory Visit: Payer: Self-pay

## 2021-08-20 ENCOUNTER — Ambulatory Visit (HOSPITAL_COMMUNITY): Payer: Medicaid Other | Admitting: Psychiatry

## 2021-09-24 ENCOUNTER — Ambulatory Visit: Payer: Medicaid Other | Admitting: Cardiology

## 2021-12-26 ENCOUNTER — Other Ambulatory Visit (HOSPITAL_COMMUNITY): Payer: Self-pay | Admitting: Family Medicine

## 2022-01-21 ENCOUNTER — Telehealth: Payer: Self-pay | Admitting: Cardiology

## 2022-01-21 NOTE — Telephone Encounter (Signed)
Patient c/o Palpitations:  High priority if patient c/o lightheadedness, shortness of breath, or chest pain ? ?How long have you had palpitations/irregular HR/ Afib? Are you having the symptoms now? Yes- seems to be getting worse lately ? ?Are you currently experiencing lightheadedness, SOB or CP? no ? ?Do you have a history of afib (atrial fibrillation) or irregular heart rhythm?  ? ?Have you checked your BP or HR? (document readings if available):  have not been checking it ? ?Are you experiencing any other symptoms? No- just having them more often ? ?

## 2022-01-21 NOTE — Telephone Encounter (Signed)
Pt called to report since yesterday she's been experiencing more palpation episodes but denies any other symptoms. She state symptoms occurred roughly every hour when active. She denies caffeine intake and state she's been drinking sufficient amount of fluid. She also report she has been taking metoprolol 100 mg daily. ? ?Will forward to NP for recommendations ? ? ?

## 2022-01-21 NOTE — Telephone Encounter (Signed)
-  Pt updated and verbalized understanding. ?-Appointment scheduled for 5/11 with Doreene Adas, PA for further evaluations  ? ?Deberah Pelton, NP  You 2 hours ago (10:40 AM)  ? ?Please contact Ms. Ringstad and let her know that she take an extra half dose of her metoprolol.  Please ask her to avoid triggers for palpitations caffeine, chocolate, EtOH, dehydration etc.  Please schedule her for a follow-up appointment.  Thank you.   ? ?

## 2022-01-22 NOTE — Progress Notes (Signed)
?Cardiology Office Note:   ? ?Date:  01/23/2022  ? ?ID:  Paula Riley, DOB 02/28/1981, MRN [id number] ? ?PCP:  Jake Samples, PA-C ?  ?[facility] Providers ?Cardiologist:  Kirk Ruths, MD ?Cardiology APP:  Ledora Bottcher, [place] { ?Referring MD: Jake Samples, PA*  ? ?Chief Complaint  ?Patient presents with  ? Follow-up  ?palpitations ? ?History of Present Illness:   ? ?Paula Riley is a 41 y.o. female with a hx of palpitations, GERD, anxiety, sinus tachycardia, and anemia.  She has completed a heart monitor for palpitations.  Prior heart monitors (2011, 2013, 2016, 2019) have shown no significant arrhythmias, normal sinus rhythm, sinus bradycardia, sinus tachycardia, PACs and rare PVCs.  Echocardiogram 04/2019 showed normal LV function and no significant valvular disease.  She was evaluated by Dr. Stanford Breed 11/07/2019 with brief nonsustained palpitations.  She was 5 months pregnant and was seeking to change her beta-blocker.  Her metoprolol was transitioned to labetalol at that time.  She was seen back per her PCP for waking up gasping for air and was sent for sleep study.  Unclear if this was completed.  ABIs 02/14/2021 for leg pain showed mild bilateral PAD in her lower extremities.  She was last seen in clinic by Coletta Memos, NP 07/16/2021.  She cares for her 3 children and works 60 hours a week at [employer]. ? ?She called our office on 01/21/2022 to report increased palpitations and was told that she can take an extra metoprolol.  She was added to my schedule today. She reports increased frequency of palpitations over the past 2 days. She reports increased frequency and lasts seconds. Palpitations occur at work, have not woken her from sleep. Rare palpitations at home. She is having palpitations every hour at work. No dizziness, lightheadedness, or pre-/syncope.  ? ?She works at [employer] and [employer]. She works first shift at [employer] and third shift at [employer]. She is more stressed at work. She does not  drink caffeine, alcohol, or use cocaine.  ? ?She is is now taking 100 mg toprol. She is very nervous in the office. Her partner at home helps care for her 15 yo, 40 yo, and 40yo.  ? ?Past Medical History:  ?Diagnosis Date  ? Anxiety   ? GERD (gastroesophageal reflux disease)   ? Heart murmur   ? History of Holter monitoring 06/2010 and 11/2011  ? Cardionet montior with sinus tachycardia, PAC's only, no arrhythmias either monitor  ? Hx of echocardiogram 06/2010  ? normal  ? Mental disorder   ? Palpitations   ? Pregnancy induced hypertension   ? ? ?Past Surgical History:  ?Procedure Laterality Date  ? DENTAL SURGERY    ? INDUCED ABORTION    ? ? ?Current Medications: ?Current Meds  ?Medication Sig  ? Magnesium 400 MG TABS Take 400 mg by mouth at bedtime.  ? metoprolol succinate (TOPROL-XL) 50 MG 24 hr tablet Take with or immediately following a meal.  ? PARAGARD INTRAUTERINE COPPER IU by Intrauterine route.  ? Vitamin D, Ergocalciferol, (DRISDOL) 1.25 MG (50000 UNIT) CAPS capsule Take by mouth.  ?  ? ?Allergies:   Patient has no known allergies.  ? ?Social History  ? ?Socioeconomic History  ? Marital status: Soil scientist  ?  Spouse name: Not on file  ? Number of children: 3  ? Years of education: 47  ? Highest education level: Some college, no degree  ?Occupational History  ? Occupation: [employer]  ?Tobacco Use  ? Smoking  status: Never  ? Smokeless tobacco: Never  ?Vaping Use  ? Vaping Use: Never used  ?Substance and Sexual Activity  ? Alcohol use: No  ?  Alcohol/week: 0.0 standard drinks  ? Drug use: No  ? Sexual activity: Yes  ?  Birth control/protection: I.U.D.  ?Other Topics Concern  ? Not on file  ?Social History Narrative  ? Lives with 2 children in an apartment on the second floor.    ? Works at [employer].  Education: high school.  ? ?Social Determinants of Health  ? ?Financial Resource Strain: Not on file  ?Food Insecurity: Not on file  ?Transportation Needs: Not on file  ?Physical Activity: Not on file  ?Stress:  Not on file  ?Social Connections: Not on file  ?  ? ?Family History: ?The patient's family history includes Breast cancer in her maternal grandmother; CAD in an other family member; Diabetes in her maternal grandmother; Healthy in her sister, sister, son, and son; Hypertension in an other family member. ? ?ROS:   ?Please see the history of present illness.    ? All other systems reviewed and are negative. ? ?EKGs/Labs/Other Studies Reviewed:   ? ?The following studies were reviewed today: ? ?Heart monitor 2019 ?Sinus bradycardia, normal sinus rhythm, sinus tachycardia, occasional PAC, rare PVC ? ?EKG:  EKG is  ordered today.  The ekg ordered today demonstrates sinus rhythm HR 90 ? ?Recent Labs: ?No results found for requested labs within last 8760 hours.  ?Recent Lipid Panel ?No results found for: CHOL, TRIG, HDL, CHOLHDL, VLDL, LDLCALC, LDLDIRECT ? ? ?Risk Assessment/Calculations:   ?  ? ?    ? ?Physical Exam:   ? ?VS:  BP 122/74   Pulse 90   Ht 5\' 5"  (1.651 m)   Wt 115 lb 12.8 oz (52.5 kg)   SpO2 98%   BMI 19.27 kg/m?    ? ?Wt Readings from Last 3 Encounters:  ?01/23/22 115 lb 12.8 oz (52.5 kg)  ?07/16/21 111 lb 12.8 oz (50.7 kg)  ?02/25/21 110 lb (49.9 kg)  ?  ? ?GEN:  Well nourished, well developed in no acute distress ?HEENT: Normal ?NECK: No JVD; No carotid bruits ?LYMPHATICS: No lymphadenopathy ?CARDIAC: RRR, no murmurs, rubs, gallops ?RESPIRATORY:  Clear to auscultation without rales, wheezing or rhonchi  ?ABDOMEN: Soft, non-tender, non-distended ?MUSCULOSKELETAL:  No edema; No deformity  ?SKIN: Warm and dry ?NEUROLOGIC:  Alert and oriented x 3 ?PSYCHIATRIC:  Normal affect  ? ?ASSESSMENT:   ? ?1. Palpitations   ?2. Hypomagnesemia   ?3. Anxiety   ?4. Hyperlipidemia LDL goal <100   ? ?PLAN:   ? ?In order of problems listed above: ? ?Palpitations ?- low suspicion for significant arrhythmias ?- continue 100 mg toprol ?- will check labs, including CBC, BMP, Mg, TSH, Vitamin D, lipids, LFTs ?- I have  recommended 400 mg Mg at night ?- she doesn't drink caffeine ? ? ?History of hypomagnesemia  ?Mg was 1.4 in 02/2020 ?- will check Mg and supplement with 400 mg nightly ? ? ?Anxiety ?- appears anxious in the office ? ? ?Hyperlipidemia ?LDL 158 2022 ?- will recheck these, but recommend LDL under 100 ? ? ?Follow up in 3 months - may cancel this appt if palpitations are better.  ? ?   ? ? ?Medication Adjustments/Labs and Tests Ordered: ?Current medicines are reviewed at length with the patient today.  Concerns regarding medicines are outlined above.  ?Orders Placed This Encounter  ?Procedures  ? Magnesium  ? TSH  ?  Basic metabolic panel  ? CBC  ? Lipid panel  ? Hepatic function panel  ? VITAMIN D 25 Hydroxy (Vit-D Deficiency, Fractures)  ? EKG 12-Lead  ? ?Meds ordered this encounter  ?Medications  ? Magnesium 400 MG TABS  ?  Sig: Take 400 mg by mouth at bedtime.  ?  Dispense:  90 tablet  ?  Refill:  3  ? ? ?Patient Instructions  ?Medication Instructions:  ?TAKE Magnesium 400 mg at night  ?*If you need a refill on your cardiac medications before your next appointment, please call your pharmacy* ? ?Lab Work: ?Your physician recommends that you return for lab work TODAY ?BMP ?CBC ?FLP ?LFT ?Magnesium ?TSH ?Vitamin D ?If you have labs (blood work) drawn today and your tests are completely normal, you will receive your results only by: ?MyChart Message (if you have MyChart) OR ?A paper copy in the mail ?If you have any lab test that is abnormal or we need to change your treatment, we will call you to review the results. ? ?Testing/Procedures: ?NONE ordered at this time of appointment  ? ?Follow-Up: ?At [facility], you and your health needs are our priority.  As part of our continuing mission to provide you with exceptional heart care, we have created designated Provider Care Teams.  These Care Teams include your primary Cardiologist (physician) and Advanced Practice Providers (APPs -  Physician Assistants and Nurse  Practitioners) who all work together to provide you with the care you need, when you need it. ? ?Your next appointment:   ?3 month(s) ? ?The format for your next appointment:   ?In Person ? ?Provider:   ?Kirk Ruths, MD

## 2022-01-23 ENCOUNTER — Encounter: Payer: Self-pay | Admitting: Physician Assistant

## 2022-01-23 ENCOUNTER — Ambulatory Visit (INDEPENDENT_AMBULATORY_CARE_PROVIDER_SITE_OTHER): Payer: Medicaid Other | Admitting: Physician Assistant

## 2022-01-23 VITALS — BP 122/74 | HR 90 | Ht 65.0 in | Wt 115.8 lb

## 2022-01-23 DIAGNOSIS — R002 Palpitations: Secondary | ICD-10-CM

## 2022-01-23 DIAGNOSIS — F419 Anxiety disorder, unspecified: Secondary | ICD-10-CM

## 2022-01-23 DIAGNOSIS — E785 Hyperlipidemia, unspecified: Secondary | ICD-10-CM | POA: Diagnosis not present

## 2022-01-23 MED ORDER — MAGNESIUM 400 MG PO TABS: 400.0000 mg | ORAL_TABLET | Freq: Every evening | ORAL | 3 refills | Status: DC

## 2022-01-23 NOTE — Patient Instructions (Signed)
Medication Instructions:  ?TAKE Magnesium 400 mg at night  ?*If you need a refill on your cardiac medications before your next appointment, please call your pharmacy* ? ?Lab Work: ?Your physician recommends that you return for lab work TODAY ?BMP ?CBC ?FLP ?LFT ?Magnesium ?TSH ?Vitamin D ?If you have labs (blood work) drawn today and your tests are completely normal, you will receive your results only by: ?MyChart Message (if you have MyChart) OR ?A paper copy in the mail ?If you have any lab test that is abnormal or we need to change your treatment, we will call you to review the results. ? ?Testing/Procedures: ?NONE ordered at this time of appointment  ? ?Follow-Up: ?At [facility], you and your health needs are our priority.  As part of our continuing mission to provide you with exceptional heart care, we have created designated Provider Care Teams.  These Care Teams include your primary Cardiologist (physician) and Advanced Practice Providers (APPs -  Physician Assistants and Nurse Practitioners) who all work together to provide you with the care you need, when you need it. ? ?Your next appointment:   ?3 month(s) ? ?The format for your next appointment:   ?In Person ? ?Provider:   ?Olga Millers, MD   ? ? ?Other Instructions ? ? ?Important Information About Sugar ? ? ? ? ? ? ?

## 2022-01-24 LAB — CBC
Hematocrit: 36.3 % (ref 34.0–46.6)
Hemoglobin: 11.9 g/dL (ref 11.1–15.9)
MCH: 29.1 pg (ref 26.6–33.0)
MCHC: 32.8 g/dL (ref 31.5–35.7)
MCV: 89 fL (ref 79–97)
Platelets: 448 10*3/uL (ref 150–450)
RBC: 4.09 x10E6/uL (ref 3.77–5.28)
RDW: 13.7 % (ref 11.7–15.4)
WBC: 3.1 10*3/uL — ABNORMAL LOW (ref 3.4–10.8)

## 2022-01-24 LAB — TSH: TSH: 0.864 u[IU]/mL (ref 0.450–4.500)

## 2022-01-24 LAB — LIPID PANEL
Chol/HDL Ratio: 2.9 ratio (ref 0.0–4.4)
Cholesterol, Total: 240 mg/dL — ABNORMAL HIGH (ref 100–199)
HDL: 84 mg/dL (ref >39)
LDL Chol Calc (NIH): 151 mg/dL — ABNORMAL HIGH (ref 0–99)
Triglycerides: 35 mg/dL (ref 0–149)
VLDL Cholesterol Cal: 5 mg/dL (ref 5–40)

## 2022-01-24 LAB — BASIC METABOLIC PANEL
BUN/Creatinine Ratio: 17 (ref 9–23)
BUN: 11 mg/dL (ref 6–24)
CO2: 23 mmol/L (ref 20–29)
Calcium: 9.3 mg/dL (ref 8.7–10.2)
Chloride: 103 mmol/L (ref 96–106)
Creatinine, Ser: 0.65 mg/dL (ref 0.57–1.00)
Glucose: 87 mg/dL (ref 70–99)
Potassium: 5 mmol/L (ref 3.5–5.2)
Sodium: 140 mmol/L (ref 134–144)
eGFR: 114 mL/min/{1.73_m2} (ref >59)

## 2022-01-24 LAB — HEPATIC FUNCTION PANEL
ALT: 21 IU/L (ref 0–32)
AST: 22 IU/L (ref 0–40)
Albumin: 4.3 g/dL (ref 3.8–4.8)
Alkaline Phosphatase: 41 IU/L — ABNORMAL LOW (ref 44–121)
Bilirubin Total: 0.3 mg/dL (ref 0.0–1.2)
Bilirubin, Direct: <0.1 mg/dL (ref 0.00–0.40)
Total Protein: 6.8 g/dL (ref 6.0–8.5)

## 2022-01-24 LAB — MAGNESIUM: Magnesium: 1.9 mg/dL (ref 1.6–2.3)

## 2022-01-24 LAB — VITAMIN D 25 HYDROXY (VIT D DEFICIENCY, FRACTURES): Vit D, 25-Hydroxy: 12.5 ng/mL — ABNORMAL LOW (ref 30.0–100.0)

## 2022-01-24 MED ORDER — ROSUVASTATIN CALCIUM 10 MG PO TABS: 10.0000 mg | ORAL_TABLET | Freq: Every day | ORAL | 3 refills | Status: DC

## 2022-01-24 NOTE — Addendum Note (Signed)
Addended by: Duane Boston on: 01/24/2022 02:00 PM ? ? Modules accepted: Orders ? ?

## 2022-03-17 ENCOUNTER — Other Ambulatory Visit: Payer: Self-pay | Admitting: Cardiology

## 2022-04-28 ENCOUNTER — Other Ambulatory Visit (HOSPITAL_COMMUNITY): Payer: Self-pay | Admitting: Family Medicine

## 2022-04-28 DIAGNOSIS — Z1231 Encounter for screening mammogram for malignant neoplasm of breast: Secondary | ICD-10-CM

## 2022-04-29 ENCOUNTER — Telehealth: Payer: Self-pay | Admitting: Cardiology

## 2022-04-29 NOTE — Telephone Encounter (Signed)
Called patient, LVM advised of message below.  Patient has upcoming appointment on 09/11- advised her to keep this appointment to discuss other options.   Left call back number if questions.

## 2022-04-29 NOTE — Telephone Encounter (Signed)
Called patient, she states she tried to get a sleep study done before- I see there was notes from [facility], but last note was back in 2021- she states she was unable to complete the sleep study.   She would like to know if she could try again. Would you recommend itamar or home sleep study.   Thank you!

## 2022-04-29 NOTE — Telephone Encounter (Signed)
Patient states she has not heard anything back regarding her sleep study and she would like to know how to proceed. Please advise.

## 2022-05-02 ENCOUNTER — Ambulatory Visit (HOSPITAL_COMMUNITY)
Admission: RE | Admit: 2022-05-02 | Discharge: 2022-05-02 | Disposition: A | Discharge status: Home / Self Care | Payer: Medicaid Other | Source: Ambulatory Visit | Attending: Family Medicine | Admitting: Family Medicine

## 2022-05-02 DIAGNOSIS — Z1231 Encounter for screening mammogram for malignant neoplasm of breast: Secondary | ICD-10-CM | POA: Diagnosis present

## 2022-05-12 NOTE — Progress Notes (Signed)
HPI: FU palpitations. Previous monitor in October of 2011 showed no significant arrhythmias. Cardionet 3/13 revealed sinus to sinus tachycardia with PACs. Repeat 3/16 showed sinus with pacs. Holter monitor 8/19 showed sinus bradycardia, normal sinus rhythm, sinus tachycardia, occasional PAC and rare PVC. Echocardiogram August 2020 showed normal LV systolic function.  ABIs June 2022 mild bilaterally.  Since last seen, she denies dyspnea, chest pain or syncope.  Her palpitations are controlled.  Current Outpatient Medications  Medication Sig Dispense Refill   Magnesium 400 MG TABS Take 400 mg by mouth at bedtime. 90 tablet 3   metoprolol succinate (TOPROL-XL) 50 MG 24 hr tablet TAKE 2 TABLETS EVERY DAY WITH OR IMMEDIATELY FOLLOWING A MEAL 180 tablet 3   PARAGARD INTRAUTERINE COPPER IU by Intrauterine route.     rosuvastatin (CRESTOR) 10 MG tablet Take 1 tablet (10 mg total) by mouth daily. 90 tablet 3   Vitamin D, Ergocalciferol, (DRISDOL) 1.25 MG (50000 UNIT) CAPS capsule Take by mouth.     No current facility-administered medications for this visit.     Past Medical History:  Diagnosis Date   Anxiety    GERD (gastroesophageal reflux disease)    Heart murmur    History of Holter monitoring 06/2010 and 11/2011   Cardionet montior with sinus tachycardia, PAC's only, no arrhythmias either monitor   Hx of echocardiogram 06/2010   normal   Mental disorder    Palpitations    Pregnancy induced hypertension     Past Surgical History:  Procedure Laterality Date   DENTAL SURGERY     INDUCED ABORTION      Social History   Socioeconomic History   Marital status: Media planner    Spouse name: Not on file   Number of children: 3   Years of education: 13   Highest education level: Some college, no degree  Occupational History   Occupation: [employer]  Tobacco Use   Smoking status: Never   Smokeless tobacco: Never  Vaping Use   Vaping Use: Never used  Substance and Sexual  Activity   Alcohol use: No    Alcohol/week: 0.0 standard drinks of alcohol   Drug use: No   Sexual activity: Yes    Birth control/protection: I.U.D.  Other Topics Concern   Not on file  Social History Narrative   Lives with 2 children in an apartment on the second floor.     Works at [employer].  Education: high school.   Social Determinants of Health   Financial Resource Strain: Low Risk  (09/03/2020)   Overall Financial Resource Strain (CARDIA)    Difficulty of Paying Living Expenses: Not hard at all  Food Insecurity: No Food Insecurity (09/03/2020)   Hunger Vital Sign    Worried About Running Out of Food in the Last Year: Never true    Ran Out of Food in the Last Year: Never true  Transportation Needs: No Transportation Needs (09/03/2020)   PRAPARE - Administrator, Civil Service (Medical): No    Lack of Transportation (Non-Medical): No  Physical Activity: Insufficiently Active (09/03/2020)   Exercise Vital Sign    Days of Exercise per Week: 1 day    Minutes of Exercise per Session: 10 min  Stress: Stress Concern Present (09/03/2020)   [employer] of Occupational Health - Occupational Stress Questionnaire    Feeling of Stress : Very much  Social Connections: Moderately Integrated (09/03/2020)   Social Connection and Isolation Panel [NHANES]    Frequency of  Communication with Friends and Family: More than three times a week    Frequency of Social Gatherings with Friends and Family: Twice a week    Attends Religious Services: 1 to 4 times per year    Active Member of [employer] or Organizations: No    Attends Banker Meetings: Never    Marital Status: Living with partner  Intimate Partner Violence: Not At Risk (09/03/2020)   Humiliation, Afraid, Rape, and Kick questionnaire    Fear of Current or Ex-Partner: No    Emotionally Abused: No    Physically Abused: No    Sexually Abused: No    Family History  Problem Relation Age of Onset   Hypertension  Other    CAD Other    Diabetes Maternal Grandmother    Breast cancer Maternal Grandmother    Healthy Son    Healthy Sister        x 6   Healthy Sister        x 2   Healthy Son     ROS: no fevers or chills, productive cough, hemoptysis, dysphasia, odynophagia, melena, hematochezia, dysuria, hematuria, rash, seizure activity, orthopnea, PND, pedal edema, claudication. Remaining systems are negative.  Physical Exam: Well-developed well-nourished in no acute distress.  Skin is warm and dry.  HEENT is normal.  Neck is supple.  Chest is clear to auscultation with normal expansion.  Cardiovascular exam is regular rate and rhythm.  Abdominal exam nontender or distended. No masses palpated. Extremities show no edema. neuro grossly intact  A/P  1 palpitations-previous monitor revealed PACs and PVCs.  LV function normal.  Continue beta-blocker.  2 history of atypical chest pain-patient denies recent chest pain.  No plans for further ischemia evaluation.  3 history of anxiety-follow-up primary care.  4 hyperlipidemia-she would prefer not to take a statin if possible.  She will continue diet.  Recheck lipids in 3 months and treat accordingly.  Olga Millers, MD

## 2022-05-26 ENCOUNTER — Ambulatory Visit: Payer: Medicaid Other | Attending: Cardiology | Admitting: Cardiology

## 2022-05-26 ENCOUNTER — Encounter: Payer: Self-pay | Admitting: Cardiology

## 2022-05-26 VITALS — BP 120/72 | HR 96 | Ht 65.0 in | Wt 113.0 lb

## 2022-05-26 DIAGNOSIS — E785 Hyperlipidemia, unspecified: Secondary | ICD-10-CM | POA: Diagnosis not present

## 2022-05-26 DIAGNOSIS — R0789 Other chest pain: Secondary | ICD-10-CM | POA: Diagnosis not present

## 2022-05-26 DIAGNOSIS — R002 Palpitations: Secondary | ICD-10-CM | POA: Diagnosis not present

## 2022-05-26 NOTE — Patient Instructions (Signed)
  Lab Work:  Your physician recommends that you return for lab work in:3 MONTHS-FASTING  If you have labs (blood work) drawn today and your tests are completely normal, you will receive your results only by: MyChart Message (if you have MyChart) OR A paper copy in the mail If you have any lab test that is abnormal or we need to change your treatment, we will call you to review the results.   Follow-Up: At [facility], you and your health needs are our priority.  As part of our continuing mission to provide you with exceptional heart care, we have created designated Provider Care Teams.  These Care Teams include your primary Cardiologist (physician) and Advanced Practice Providers (APPs -  Physician Assistants and Nurse Practitioners) who all work together to provide you with the care you need, when you need it.  We recommend signing up for the patient portal called "MyChart".  Sign up information is provided on this After Visit Summary.  MyChart is used to connect with patients for Virtual Visits (Telemedicine).  Patients are able to view lab/test results, encounter notes, upcoming appointments, etc.  Non-urgent messages can be sent to your provider as well.   To learn more about what you can do with MyChart, go to [email or web address].    Your next appointment:   6 month(s)  The format for your next appointment:   In Person  Provider:   Marjie Skiff, PA-C, Azalee Course, PA-C, or Bernadene Person, NP    Then, Olga Millers, MD will plan to see you again in 12 month(s).

## 2022-06-02 ENCOUNTER — Other Ambulatory Visit: Payer: Self-pay | Admitting: *Deleted

## 2022-06-02 DIAGNOSIS — G4733 Obstructive sleep apnea (adult) (pediatric): Secondary | ICD-10-CM

## 2022-06-04 ENCOUNTER — Telehealth: Payer: Self-pay

## 2022-06-04 ENCOUNTER — Telehealth: Payer: Self-pay | Admitting: *Deleted

## 2022-06-04 NOTE — Telephone Encounter (Signed)
I called the patient to speak to her about the [employer]. She stated that she will stop by the office next week to pick the device up.

## 2022-06-04 NOTE — Telephone Encounter (Addendum)
Prior Authorization for [employer] sent to [facility] via [employer]. Approval # [id number].  Valid dates 06/04/22 to 09/02/22. Debria Garret notified.

## 2022-10-15 ENCOUNTER — Telehealth: Payer: Self-pay

## 2022-10-15 NOTE — Telephone Encounter (Signed)
Patient returned my call about the [employer] Device. She states that she had forgotten to pick the watch up and that she will have the provider to put in another order for the device when she come to her appointment to see Diona Browner in March.

## 2022-10-15 NOTE — Telephone Encounter (Signed)
This encounter was created in error - please disregard.

## 2022-10-15 NOTE — Telephone Encounter (Signed)
According to my records the patient never came by the office to pick up the Itamar device. Therefore, the patient will need to have another office visit so they can reorder it.

## 2022-11-24 ENCOUNTER — Ambulatory Visit: Payer: Medicaid Other | Admitting: Nurse Practitioner

## 2023-01-01 ENCOUNTER — Ambulatory Visit: Payer: Medicaid Other | Attending: Nurse Practitioner | Admitting: Nurse Practitioner

## 2023-01-01 ENCOUNTER — Encounter: Payer: Self-pay | Admitting: Nurse Practitioner

## 2023-01-01 VITALS — BP 116/74 | HR 94 | Ht 65.0 in | Wt 115.6 lb

## 2023-01-01 DIAGNOSIS — R002 Palpitations: Secondary | ICD-10-CM | POA: Diagnosis not present

## 2023-01-01 DIAGNOSIS — R0789 Other chest pain: Secondary | ICD-10-CM | POA: Diagnosis not present

## 2023-01-01 DIAGNOSIS — Z87898 Personal history of other specified conditions: Secondary | ICD-10-CM | POA: Diagnosis not present

## 2023-01-01 DIAGNOSIS — E785 Hyperlipidemia, unspecified: Secondary | ICD-10-CM | POA: Diagnosis not present

## 2023-01-01 DIAGNOSIS — I739 Peripheral vascular disease, unspecified: Secondary | ICD-10-CM | POA: Diagnosis not present

## 2023-01-01 NOTE — Patient Instructions (Signed)
Medication Instructions:  Your physician recommends that you continue on your current medications as directed. Please refer to the Current Medication list given to you today.   *If you need a refill on your cardiac medications before your next appointment, please call your pharmacy*   Lab Work: NONE ordered at this time of appointment   If you have labs (blood work) drawn today and your tests are completely normal, you will receive your results only by: MyChart Message (if you have MyChart) OR A paper copy in the mail If you have any lab test that is abnormal or we need to change your treatment, we will call you to review the results.   Testing/Procedures: Your physician has recommended that you have a sleep study. This test records several body functions during sleep, including: brain activity, eye movement, oxygen and carbon dioxide blood levels, heart rate and rhythm, breathing rate and rhythm, the flow of air through your mouth and nose, snoring, body muscle movements, and chest and belly movement.    Follow-Up: At [facility], you and your health needs are our priority.  As part of our continuing mission to provide you with exceptional heart care, we have created designated Provider Care Teams.  These Care Teams include your primary Cardiologist (physician) and Advanced Practice Providers (APPs -  Physician Assistants and Nurse Practitioners) who all work together to provide you with the care you need, when you need it.  We recommend signing up for the patient portal called "MyChart".  Sign up information is provided on this After Visit Summary.  MyChart is used to connect with patients for Virtual Visits (Telemedicine).  Patients are able to view lab/test results, encounter notes, upcoming appointments, etc.  Non-urgent messages can be sent to your provider as well.   To learn more about what you can do with MyChart, go to [email or web address].    Your next appointment:    1 year(s)  Provider:   Olga Millers, MD     Other Instructions

## 2023-01-01 NOTE — Progress Notes (Signed)
Office Visit    Patient Name: Paula Riley Date of Encounter: 01/01/2023  Primary Care Provider:  Avis Epley, PA-C Primary Cardiologist:  Olga Millers, MD  Chief Complaint    42 year old female with a history of palpitations, PACs and PVCs, atypical chest pain, hyperlipidemia, mild PAD, anxiety, and snoring who presents for follow-up related to palpitations.  Past Medical History    Past Medical History:  Diagnosis Date   Anxiety    GERD (gastroesophageal reflux disease)    Heart murmur    History of Holter monitoring 06/2010 and 11/2011   Cardionet montior with sinus tachycardia, PAC's only, no arrhythmias either monitor   Hx of echocardiogram 06/2010   normal   Mental disorder    Palpitations    Pregnancy induced hypertension    Past Surgical History:  Procedure Laterality Date   DENTAL SURGERY     INDUCED ABORTION      Allergies  No Known Allergies   Labs/Other Studies Reviewed    The following studies were reviewed today: Holter monitor 2019: Sinus bradycardia, normal sinus rhythm, sinus tachycardia, occasional PAC, rare PVC Olga Millers, MD  Echo 2020: IMPRESSIONS     1. The left ventricle has normal systolic function, with an ejection  fraction of 55-60%. The cavity size was normal. Left ventricular diastolic  parameters were normal.   2. The right ventricle has normal systolic function. The cavity was  normal. There is no increase in right ventricular wall thickness.   3. The aorta is normal unless otherwise noted.   4. The aortic root is normal in size and structure.   ABIs 2022: Unable to obtain toe pressures due to patient constant moving/ tremors of  feet.    Summary:  Right: Resting right ankle-brachial index indicates mild right lower  extremity arterial disease.   Left: Resting left ankle-brachial index indicates mild right lower  extremity arterial disease.   Recent Labs: 01/23/2022: ALT 21; BUN 11; Creatinine, Ser  0.65; Hemoglobin 11.9; Magnesium 1.9; Platelets 448; Potassium 5.0; Sodium 140; TSH 0.864  Recent Lipid Panel    Component Value Date/Time   CHOL 240 (H) 01/23/2022 0932   TRIG 35 01/23/2022 0932   HDL 84 01/23/2022 0932   CHOLHDL 2.9 01/23/2022 0932   LDLCALC 151 (H) 01/23/2022 0932    History of Present Illness    42 year old female with the above past medical history including palpitations, PACs and PVCs, atypical chest pain, hyperlipidemia, mild PAD, anxiety, and snoring.  Previous monitor in October 2011 showed no significant arrhythmia.  CardioNet in 2013 revealed rhythm, sinus tach since.  Repeat 2016 showed sinus with PACs.  Holter monitor in 2019 showed sinus bradycardia, normal sinus rhythm, sinus tachycardia, occasional PACs and rare PVCs.  Echocardiogram in August 2020 showed normal LV systolic function.  ABIs in June 2022 showed mild PAD bilaterally.  She was last seen in the office on 05/26/2022 and was stable from a cardiac standpoint.  Her palpitations for well controlled on beta-blocker therapy.  She denied chest pain.  Sleep study was ordered for history of snoring.   She presents today for follow-up.  Since her last visit she has done well from a cardiac standpoint.  She denies any significant palpitations, denies chest pain, dyspnea, denies claudication.  He notes she was never able to complete her sleep study due to an unpredictable sleep schedule as she had an infant son at the time.  She is now interested in pursuing a sleep study. Overall,  she reports feeling well.    Home Medications    Current Outpatient Medications  Medication Sig Dispense Refill   clonazePAM (KLONOPIN) 0.5 MG tablet Take 0.5 mg by mouth daily as needed.     metoprolol succinate (TOPROL-XL) 50 MG 24 hr tablet TAKE 2 TABLETS EVERY DAY WITH OR IMMEDIATELY FOLLOWING A MEAL 180 tablet 3   PARAGARD INTRAUTERINE COPPER IU by Intrauterine route.     Vitamin D, Ergocalciferol, (DRISDOL) 1.25 MG (50000  UNIT) CAPS capsule Take by mouth.     Magnesium 400 MG TABS Take 400 mg by mouth at bedtime. (Patient not taking: Reported on 01/01/2023) 90 tablet 3   No current facility-administered medications for this visit.     Review of Systems   She denies chest pain, palpitations, dyspnea, pnd, orthopnea, n, v, dizziness, syncope, edema, weight gain, or early satiety. All other systems reviewed and are otherwise negative except as noted above.   Physical Exam    VS:  BP 116/74   Pulse 94   Ht  (1.651 m)   Wt 115 lb 9.6 oz (52.4 kg)   SpO2 99%   BMI 19.24 kg/m   GEN: Well nourished, well developed, in no acute distress. HEENT: normal. Neck: Supple, no JVD, carotid bruits, or masses. Cardiac: RRR, no murmurs, rubs, or gallops. No clubbing, cyanosis, edema.  Radials/DP/PT 2+ and equal bilaterally.  Respiratory:  Respirations regular and unlabored, clear to auscultation bilaterally. GI: Soft, nontender, nondistended, BS + x 4. MS: no deformity or atrophy. Skin: warm and dry, no rash. Neuro:  Strength and sensation are intact. Psych: Normal affect.  Accessory Clinical Findings    ECG personally reviewed by me today -NSR, 94 bpm- no acute changes.   Lab Results  Component Value Date   WBC 3.1 (L) 01/23/2022   HGB 11.9 01/23/2022   HCT 36.3 01/23/2022   MCV 89 01/23/2022   PLT 448 01/23/2022   Lab Results  Component Value Date   CREATININE 0.65 01/23/2022   BUN 11 01/23/2022   NA 140 01/23/2022   K 5.0 01/23/2022   CL 103 01/23/2022   CO2 23 01/23/2022   Lab Results  Component Value Date   ALT 21 01/23/2022   AST 22 01/23/2022   ALKPHOS 41 (L) 01/23/2022   BILITOT 0.3 01/23/2022   Lab Results  Component Value Date   CHOL 240 (H) 01/23/2022   HDL 84 01/23/2022   LDLCALC 151 (H) 01/23/2022   TRIG 35 01/23/2022   CHOLHDL 2.9 01/23/2022    No results found for: "HGBA1C"  Assessment & Plan   1. Palpitations: Prior monitor showed sinus rhythm with PACs, PVCs.  Denies any recent palpitations. Stable on metoprolol.   2. Atypical chest pain: Echo in August 2020 showed normal LV systolic function. Stable with no anginal symptoms. No indication for ischemic evaluation.   3. Hyperlipidemia: LDL was 125 in 10/2022. Declines statin therapy.   4. History of snoring: She is uncertain whether or not she snores.  She has had 2 attempts at a prior sleep study. Epworth 5, STOP-BANG 0. Will re-attempt Itamar sleep study.   5. Mild PAD: ABIs in June 2022 showed mild PAD bilaterally. Denies claudication. No indication for repeat study at this time.   6. Disposition: Follow-up in 1 year.      Joylene Grapes, NP 01/01/2023, 10:11 AM

## 2023-01-05 ENCOUNTER — Telehealth: Payer: Self-pay | Admitting: *Deleted

## 2023-01-05 NOTE — Telephone Encounter (Signed)
Prior Authorization for [employer] device sent to [facility] via web portal. Approval Number [id number]. Valid dates 01/05/23 to 04/05/23. Message sent to Romie Levee ok to contact the patient and have her to activate the device.

## 2023-01-06 ENCOUNTER — Telehealth: Payer: Self-pay

## 2023-01-06 NOTE — Telephone Encounter (Signed)
Called and made the patient aware that SHE may proceed with the Itamar Home Sleep Study. PIN # provided to the patient. Patient made aware that SHE will be contacted after the test has been read with the results and any recommendations. Patient verbalized understanding and thanked me for the call.   

## 2023-01-13 ENCOUNTER — Encounter (INDEPENDENT_AMBULATORY_CARE_PROVIDER_SITE_OTHER): Payer: Medicaid Other | Admitting: Cardiology

## 2023-01-13 DIAGNOSIS — G4719 Other hypersomnia: Secondary | ICD-10-CM | POA: Diagnosis not present

## 2023-01-14 ENCOUNTER — Other Ambulatory Visit: Payer: Self-pay

## 2023-01-14 MED ORDER — METOPROLOL SUCCINATE ER 50 MG PO TB24: ORAL_TABLET | ORAL | 3 refills | Status: DC

## 2023-01-16 ENCOUNTER — Ambulatory Visit: Payer: Medicaid Other | Attending: Nurse Practitioner

## 2023-01-16 DIAGNOSIS — Z87898 Personal history of other specified conditions: Secondary | ICD-10-CM

## 2023-01-16 NOTE — Procedures (Signed)
       SLEEP STUDY REPORT Patient Information Study Date: 01/13/2023 Patient Name: Paula Riley Patient ID: [id number] Birth Date: 1981-01-24 Age: 42 Gender: Female BMI: 19.1 (W=115 lb, H=5' 5'') Referring Physician: Bernadene Person, NP  TEST DESCRIPTION: Home sleep apnea testing was completed using the WatchPat, a Type 1 device, utilizing peripheral arterial tonometry (PAT), chest movement, actigraphy, pulse oximetry, pulse rate, body position and snore. AHI was calculated with apnea and hypopnea using valid sleep time as the denominator. RDI includes apneas, hypopneas, and RERAs. The data acquired and the scoring of sleep and all associated events were performed in accordance with the recommended standards and specifications as outlined in the AASM Manual for the Scoring of Sleep and Associated Events 2.2.0 (2015).   FINDINGS: 1.  No evidence of Obstructive Sleep Apnea with AHI 2/hr.  2.  No Central Sleep Apnea. 3.  Oxygen desaturations as low as 81%. 4.  Minimal snoring was present. O2 sats were < 88% for 0.1 minutes. 5.  Total sleep time was 2 hrs and 29 min. 6.  29.8% of total sleep time was spent in REM sleep.  7.  Normal sleep onset latency at 16 min.  8.  Shortened REM sleep onset latency at 25 min.  9.  Total awakenings were 3.   DIAGNOSIS:  Nondiagnostic study due to lack of adequate sleep time.  RECOMMENDATIONS:   1. Normal study with no significant sleep disordered breathing.  2.  Healthy sleep recommendations include:  adequate nightly sleep (normal 7-9 hrs/night), avoidance of caffeine after noon and alcohol near bedtime, and maintaining a sleep environment that is cool, dark and quiet.  3.  Weight loss for overweight patients is recommended.    4.  Snoring recommendations include:  weight loss where appropriate, side sleeping, and avoidance of alcohol before bed.  5.  Operation of motor vehicle or dangerous equipment must be avoided when feeling drowsy,  excessively sleepy, or mentally fatigued.    6.  An ENT consultation which may be useful for specific causes of and possible treatment of bothersome snoring.   7. Weight loss may be of benefit in reducing the severity of snoring.   Signature:   Armanda Magic, MD; [facility]; Diplomat, American Board of Sleep Medicine Electronically Signed: 01/16/2023 5:56:16 PM

## 2023-02-03 ENCOUNTER — Telehealth: Payer: Self-pay

## 2023-02-03 NOTE — Telephone Encounter (Signed)
Left VM for patient to return my call regarding PSG sleep study.

## 2023-02-10 ENCOUNTER — Other Ambulatory Visit: Payer: Self-pay

## 2023-02-10 ENCOUNTER — Telehealth: Payer: Self-pay

## 2023-02-10 DIAGNOSIS — G4733 Obstructive sleep apnea (adult) (pediatric): Secondary | ICD-10-CM

## 2023-02-10 MED ORDER — ESZOPICLONE 2 MG PO TABS: 2.0000 mg | ORAL_TABLET | Freq: Every evening | ORAL | 0 refills | Status: DC | PRN

## 2023-02-10 NOTE — Telephone Encounter (Signed)
Patient notified of sleep study results and recommendations. Patient verbalized understanding. All questions (if any) were answered. In-lab sleep study ordered today 02/10/23.

## 2023-03-10 NOTE — Addendum Note (Signed)
Addended by: Brunetta Genera on: 03/10/2023 09:20 AM   Modules accepted: Orders

## 2023-07-16 ENCOUNTER — Other Ambulatory Visit: Payer: Self-pay | Admitting: Adult Health

## 2023-07-16 ENCOUNTER — Other Ambulatory Visit (HOSPITAL_COMMUNITY)
Admission: RE | Admit: 2023-07-16 | Discharge: 2023-07-16 | Disposition: A | Discharge status: Home / Self Care | Payer: Medicaid Other | Source: Ambulatory Visit | Attending: Adult Health | Admitting: Adult Health

## 2023-07-16 ENCOUNTER — Encounter: Payer: Self-pay | Admitting: Adult Health

## 2023-07-16 ENCOUNTER — Ambulatory Visit: Payer: Medicaid Other | Admitting: Adult Health

## 2023-07-16 VITALS — BP 118/79 | HR 97 | Ht 65.0 in | Wt 126.0 lb

## 2023-07-16 DIAGNOSIS — Z124 Encounter for screening for malignant neoplasm of cervix: Secondary | ICD-10-CM | POA: Diagnosis not present

## 2023-07-16 DIAGNOSIS — N921 Excessive and frequent menstruation with irregular cycle: Secondary | ICD-10-CM | POA: Diagnosis not present

## 2023-07-16 DIAGNOSIS — Z975 Presence of (intrauterine) contraceptive device: Secondary | ICD-10-CM | POA: Diagnosis not present

## 2023-07-16 DIAGNOSIS — Z3202 Encounter for pregnancy test, result negative: Secondary | ICD-10-CM | POA: Diagnosis not present

## 2023-07-16 DIAGNOSIS — D649 Anemia, unspecified: Secondary | ICD-10-CM | POA: Diagnosis not present

## 2023-07-16 LAB — POCT URINE PREGNANCY: Preg Test, Ur: NEGATIVE

## 2023-07-16 LAB — POCT HEMOGLOBIN: Hemoglobin: 9.4 g/dL — AB (ref 11–14.6)

## 2023-07-16 NOTE — Progress Notes (Signed)
  Subjective:     Patient ID: Paula Riley, female   DOB: 1981/01/03, 42 y.o.   MRN: [id number]  HPI Paula Riley is a 42 year old black female, with DP, [id number], referred by Terie Purser PA, for irregular bleeding between periods, she has ParaGard IUD. She says it is pink tinged mucous, no pain. Periods about 21 days apart. She needs pap. Her HGB was 11.4 10/22/22 and TSH was 1.680. The bleeding has been on and off for about 2 years now.  PCP is Edison Pace PA.  Review of Systems +irregular bleeding between periods. She says it is pink tinged mucous, no pain. Periods about 21 days apart. Denies being tired, dizzy or SHOB Does have new sex partner Reviewed past medical,surgical, social and family history. Reviewed medications and allergies.     Objective:   Physical Exam BP 118/79 (BP Location: Left Arm, Patient Position: Sitting, Cuff Size: Normal)   Pulse 97   Ht 5\' 5"  (1.651 m)   Wt 126 lb (57.2 kg)   LMP 06/23/2023   BMI 20.97 kg/m  finger stick HGB was 9.4.   UPT is negative  Skin warm and dry.Pelvic: external genitalia is normal in appearance no lesions, vagina: white discharge without odor,urethra has no lesions or masses noted, cervix:bulbous,+IUD strings at os, pap with GC/CHL and HR HPV genotyping performed, uterus: normal size, shape and contour, non tender, no masses felt, adnexa: no masses or tenderness noted. Bladder is non tender and no masses felt.  Fall risk is low  Upstream - 07/16/23 0935       Pregnancy Intention Screening   Does the patient want to become pregnant in the next year? No    Does the patient's partner want to become pregnant in the next year? No    Would the patient like to discuss contraceptive options today? No      Contraception Wrap Up   Current Method IUD or IUS    End Method IUD or IUS    Contraception Counseling Provided Yes            Examination chaperoned by Malachy Mood LPN      Impression: 1. Pregnancy examination or test, negative  result - POCT urine pregnancy  2. Routine Papanicolaou smear Pap sent Pap in 3 years if normal - Cytology - PAP( [facility])  3. Irregular intermenstrual bleeding +irregular bleeding between periods. She says it is pink tinged mucous, no pain. Periods about 21 days apart. On and off for about 2 years now Will check labs and get [place] to assess uterus and ovaries Pelvic US scheduled [facility]/24/[facility] at 10:30 am  - US PELVIC COMPLETE WITH TRANSVAGINAL; Future - CBC w/Diff - Iron, TIBC and Ferritin Panel - TSH + free T4  4. IUD (intrauterine device) in place ParaGard placed July 2021  5. Low hemoglobin POC HGB was 9.4 Will check CBC, take OTC MV with iron  She does not eat red meat or many greens - CBC w/Diff     Plan:     Follow up TBD

## 2023-07-17 ENCOUNTER — Other Ambulatory Visit: Payer: Self-pay | Admitting: Adult Health

## 2023-07-17 ENCOUNTER — Telehealth: Payer: Self-pay

## 2023-07-17 ENCOUNTER — Telehealth: Payer: Self-pay | Admitting: Adult Health

## 2023-07-17 LAB — TSH+FREE T4
Free T4: 1.13 ng/dL (ref 0.82–1.77)
TSH: 1.61 u[IU]/mL (ref 0.450–4.500)

## 2023-07-17 LAB — CBC WITH DIFFERENTIAL/PLATELET
Basophils Absolute: 0 10*3/uL (ref 0.0–0.2)
Basos: 1 %
EOS (ABSOLUTE): 0 10*3/uL (ref 0.0–0.4)
Eos: 1 %
Hematocrit: 34.7 % (ref 34.0–46.6)
Hemoglobin: 10 g/dL — ABNORMAL LOW (ref 11.1–15.9)
Immature Grans (Abs): 0 10*3/uL (ref 0.0–0.1)
Immature Granulocytes: 0 %
Lymphocytes Absolute: 1.7 10*3/uL (ref 0.7–3.1)
Lymphs: 34 %
MCH: 25.1 pg — ABNORMAL LOW (ref 26.6–33.0)
MCHC: 28.8 g/dL — ABNORMAL LOW (ref 31.5–35.7)
MCV: 87 fL (ref 79–97)
Monocytes Absolute: 0.5 10*3/uL (ref 0.1–0.9)
Monocytes: 10 %
Neutrophils Absolute: 2.8 10*3/uL (ref 1.4–7.0)
Neutrophils: 54 %
Platelets: 466 10*3/uL — ABNORMAL HIGH (ref 150–450)
RBC: 3.98 x10E6/uL (ref 3.77–5.28)
RDW: 15.6 % — ABNORMAL HIGH (ref 11.7–15.4)
WBC: 5.1 10*3/uL (ref 3.4–10.8)

## 2023-07-17 LAB — IRON AND TIBC
Iron Saturation: 5 % — CL (ref 15–55)
Iron: 21 ug/dL — ABNORMAL LOW (ref 27–159)
Total Iron Binding Capacity: 442 ug/dL (ref 250–450)
UIBC: 421 ug/dL (ref 131–425)

## 2023-07-17 LAB — FERRITIN: Ferritin: 8 ng/mL — ABNORMAL LOW (ref 15–150)

## 2023-07-17 NOTE — Telephone Encounter (Signed)
Hello,  Patient will be scheduled as soon as possible.  Auth Submission: NO AUTH NEEDED Site of care: Site of care: [facility] Payer: [place] [employer]ICA[employer]  Medication & CPT/J Code(s) submitted: Venofer (Iron Sucrose) J1756 Route of submission (phone, fax, portal): phone Phone # Fax # Auth type: Buy/Bill HB Units/visits requested: 200mg , 5 doses Reference number:  Approval from: 07/17/23 to 09/15/23

## 2023-07-17 NOTE — Telephone Encounter (Signed)
Pt aware of labs and that HGB 10 and ferritin 8 and iron low, will get iron infusion next week, get [place] 07/24/23 will talk when [place] back

## 2023-07-17 NOTE — Progress Notes (Signed)
Get get iron infusion next week.

## 2023-07-21 LAB — CYTOLOGY - PAP
Chlamydia: NEGATIVE
Comment: NEGATIVE
Comment: NEGATIVE
Comment: NORMAL
Diagnosis: NEGATIVE
Diagnosis: REACTIVE
High risk HPV: NEGATIVE
Neisseria Gonorrhea: NEGATIVE

## 2023-07-22 ENCOUNTER — Encounter: Payer: Medicaid Other | Attending: Adult Health | Admitting: Internal Medicine

## 2023-07-22 VITALS — BP 118/72 | HR 84 | Temp 98.4°F

## 2023-07-22 DIAGNOSIS — D500 Iron deficiency anemia secondary to blood loss (chronic): Secondary | ICD-10-CM | POA: Insufficient documentation

## 2023-07-22 MED ORDER — DIPHENHYDRAMINE HCL 25 MG PO CAPS
25.0000 mg | ORAL_CAPSULE | Freq: Once | ORAL | Status: AC
  Administered 2023-07-22: 25 mg via ORAL

## 2023-07-22 MED ORDER — ACETAMINOPHEN 325 MG PO TABS
650.0000 mg | ORAL_TABLET | Freq: Once | ORAL | Status: AC
  Administered 2023-07-22: 650 mg via ORAL

## 2023-07-22 MED ORDER — IRON SUCROSE 20 MG/ML IV SOLN
200.0000 mg | Freq: Once | INTRAVENOUS | Status: AC
  Administered 2023-07-22: 200 mg via INTRAVENOUS
  Filled 2023-07-22: qty 10

## 2023-07-22 NOTE — Progress Notes (Signed)
Diagnosis: Iron Deficiency Anemia  Provider:   Cyril Mourning, NP  Procedure: IV Infusion  IV Type: Peripheral, IV Location: R Antecubital  Venofer (Iron Sucrose), Dose: 200 mg  Infusion Start Time: 1000  Infusion Stop Time: 1030  Post Infusion IV Care: Observation period completed  Discharge: Condition: Good, Destination: Home . AVS Provided  Performed by:  Feliberto Harts, LPN

## 2023-07-24 ENCOUNTER — Ambulatory Visit (HOSPITAL_COMMUNITY)
Admission: RE | Admit: 2023-07-24 | Discharge: 2023-07-24 | Disposition: A | Discharge status: Home / Self Care | Payer: Medicaid Other | Source: Ambulatory Visit | Attending: Adult Health | Admitting: Adult Health

## 2023-07-24 DIAGNOSIS — N921 Excessive and frequent menstruation with irregular cycle: Secondary | ICD-10-CM | POA: Diagnosis present

## 2023-07-29 ENCOUNTER — Encounter: Payer: Medicaid Other | Admitting: Emergency Medicine

## 2023-07-29 ENCOUNTER — Telehealth: Payer: Self-pay | Admitting: Adult Health

## 2023-07-29 VITALS — BP 120/82 | HR 79 | Temp 98.0°F | Resp 18

## 2023-07-29 DIAGNOSIS — D500 Iron deficiency anemia secondary to blood loss (chronic): Secondary | ICD-10-CM | POA: Diagnosis not present

## 2023-07-29 MED ORDER — DIPHENHYDRAMINE HCL 25 MG PO CAPS
25.0000 mg | ORAL_CAPSULE | Freq: Once | ORAL | Status: AC
  Administered 2023-07-29: 25 mg via ORAL
  Filled 2023-07-29: qty 1

## 2023-07-29 MED ORDER — IRON SUCROSE 20 MG/ML IV SOLN
200.0000 mg | Freq: Once | INTRAVENOUS | Status: AC
  Administered 2023-07-29: 200 mg via INTRAVENOUS
  Filled 2023-07-29: qty 10

## 2023-07-29 MED ORDER — ACETAMINOPHEN 325 MG PO TABS
650.0000 mg | ORAL_TABLET | Freq: Once | ORAL | Status: AC
  Administered 2023-07-29: 650 mg via ORAL
  Filled 2023-07-29: qty 2

## 2023-07-29 NOTE — Telephone Encounter (Signed)
Pt aware that IUD is in lower uterine segment and cervical canal, needs to be removed. Can call for appt, and decide if wants another or other birth control

## 2023-07-29 NOTE — Progress Notes (Signed)
Diagnosis: Iron Deficiency Anemia  Provider:  Cyril Mourning, NP   Procedure: IV Push  IV Type: Peripheral, IV Location: R Hand  Venofer (Iron Sucrose), Dose: 200 mg  Post Infusion IV Care: Observation period completed and Peripheral IV Discontinued  Discharge: Condition: Good, Destination: Home . AVS Provided  Performed by:  Arrie Senate, RN

## 2023-07-31 ENCOUNTER — Encounter: Payer: Medicaid Other | Admitting: Internal Medicine

## 2023-07-31 VITALS — BP 120/79 | HR 72 | Temp 98.0°F | Resp 16

## 2023-07-31 DIAGNOSIS — D500 Iron deficiency anemia secondary to blood loss (chronic): Secondary | ICD-10-CM | POA: Diagnosis not present

## 2023-07-31 MED ORDER — ACETAMINOPHEN 325 MG PO TABS
650.0000 mg | ORAL_TABLET | Freq: Once | ORAL | Status: AC
  Administered 2023-07-31: 650 mg via ORAL

## 2023-07-31 MED ORDER — IRON SUCROSE 20 MG/ML IV SOLN
200.0000 mg | Freq: Once | INTRAVENOUS | Status: AC
  Administered 2023-07-31: 200 mg via INTRAVENOUS
  Filled 2023-07-31: qty 10

## 2023-07-31 MED ORDER — DIPHENHYDRAMINE HCL 25 MG PO CAPS
25.0000 mg | ORAL_CAPSULE | Freq: Once | ORAL | Status: AC
  Administered 2023-07-31: 25 mg via ORAL

## 2023-07-31 NOTE — Progress Notes (Signed)
Diagnosis: Iron Deficiency Anemia  Provider:   Genelle Gather  Procedure: IV Push  IV Type: Peripheral, IV Location: L Antecubital  Venofer (Iron Sucrose), Dose: 200 mg  Post Infusion IV Care: Observation period completed  Discharge: Condition: Good, Destination: Home . AVS Provided  Performed by:  Cleotilde Neer, LPN

## 2023-08-05 ENCOUNTER — Ambulatory Visit: Payer: Medicaid Other

## 2023-08-07 ENCOUNTER — Encounter: Payer: Medicaid Other | Admitting: Internal Medicine

## 2023-08-07 VITALS — BP 120/75 | HR 71 | Temp 97.9°F | Resp 14

## 2023-08-07 DIAGNOSIS — D500 Iron deficiency anemia secondary to blood loss (chronic): Secondary | ICD-10-CM

## 2023-08-07 MED ORDER — IRON SUCROSE 20 MG/ML IV SOLN
200.0000 mg | Freq: Once | INTRAVENOUS | Status: AC
  Administered 2023-08-07: 200 mg via INTRAVENOUS
  Filled 2023-08-07: qty 10

## 2023-08-07 MED ORDER — IRON SUCROSE 200 MG IVPB - SIMPLE MED
200.0000 mg | Freq: Once | Status: DC
  Filled 2023-08-07: qty 110

## 2023-08-07 MED ORDER — DIPHENHYDRAMINE HCL 25 MG PO CAPS
25.0000 mg | ORAL_CAPSULE | Freq: Once | ORAL | Status: AC
  Administered 2023-08-07: 25 mg via ORAL

## 2023-08-07 MED ORDER — IRON SUCROSE 200 MG IVPB - SIMPLE MED: 200.0000 mg | Freq: Once | Status: DC

## 2023-08-07 MED ORDER — ACETAMINOPHEN 325 MG PO TABS
650.0000 mg | ORAL_TABLET | Freq: Once | ORAL | Status: AC
  Administered 2023-08-07: 650 mg via ORAL

## 2023-08-07 NOTE — Progress Notes (Signed)
Diagnosis: Iron Deficiency Anemia  Provider:   Cyril Mourning,  NP  Procedure: IV Infusion  IV Type: Peripheral, IV Location: R Antecubital  Venofer (Iron Sucrose), Dose: 200 mg  Infusion Start Time: 1103  Infusion Stop Time: 1115  Post Infusion IV Care: Observation period completed  Discharge: Condition: Good, Destination: Home . AVS Provided  Performed by:  Cleotilde Neer, LPN

## 2023-08-10 ENCOUNTER — Telehealth: Payer: Self-pay | Admitting: Adult Health

## 2023-08-10 NOTE — Telephone Encounter (Signed)
Left message @ 10:30 am, letting pt know we will recheck hemoglobin at 12/2 visit. JSY

## 2023-08-10 NOTE — Telephone Encounter (Signed)
Patient called stating that she is done with her iron infusions and she wanted to know if you wanted her to do another hemoglobin or iron check. States that she is not using my chart requesting for someone to call her

## 2023-08-12 ENCOUNTER — Encounter: Payer: Medicaid Other | Admitting: Internal Medicine

## 2023-08-12 VITALS — BP 124/74 | HR 82 | Temp 98.1°F | Resp 16

## 2023-08-12 DIAGNOSIS — D500 Iron deficiency anemia secondary to blood loss (chronic): Secondary | ICD-10-CM

## 2023-08-12 MED ORDER — ACETAMINOPHEN 325 MG PO TABS
650.0000 mg | ORAL_TABLET | Freq: Once | ORAL | Status: AC
Start: 2023-08-12 — End: 2023-08-12
  Administered 2023-08-12: 650 mg via ORAL

## 2023-08-12 MED ORDER — IRON SUCROSE 20 MG/ML IV SOLN
200.0000 mg | Freq: Once | INTRAVENOUS | Status: AC
  Administered 2023-08-12: 200 mg via INTRAVENOUS

## 2023-08-12 MED ORDER — DIPHENHYDRAMINE HCL 25 MG PO CAPS
25.0000 mg | ORAL_CAPSULE | Freq: Once | ORAL | Status: AC
  Administered 2023-08-12: 25 mg via ORAL

## 2023-08-12 NOTE — Progress Notes (Signed)
Diagnosis: Iron Deficiency Anemia  Provider:   Cyril Mourning, NP  Procedure: IV Infusion  IV Type: Peripheral, IV Location: Right AC  Venofer (Iron Sucrose), Dose: 200 mg  Infusion Start Time: 1003  Infusion Stop Time: 1015  Post Infusion IV Care: Observation period completed  Discharge: Condition: Good, Destination: Home . AVS Provided  Performed by:  Cleotilde Neer, LPN

## 2023-08-17 ENCOUNTER — Encounter: Payer: Self-pay | Admitting: Adult Health

## 2023-08-17 ENCOUNTER — Ambulatory Visit: Payer: Medicaid Other | Admitting: Adult Health

## 2023-08-17 VITALS — BP 125/76 | HR 89 | Ht 65.0 in | Wt 126.5 lb

## 2023-08-17 DIAGNOSIS — T8332XA Displacement of intrauterine contraceptive device, initial encounter: Secondary | ICD-10-CM

## 2023-08-17 DIAGNOSIS — D500 Iron deficiency anemia secondary to blood loss (chronic): Secondary | ICD-10-CM

## 2023-08-17 DIAGNOSIS — Z30433 Encounter for removal and reinsertion of intrauterine contraceptive device: Secondary | ICD-10-CM

## 2023-08-17 DIAGNOSIS — Z3202 Encounter for pregnancy test, result negative: Secondary | ICD-10-CM

## 2023-08-17 LAB — POCT URINE PREGNANCY: Preg Test, Ur: NEGATIVE

## 2023-08-17 MED ORDER — PARAGARD INTRAUTERINE COPPER IU IUD: 1.0000 | INTRAUTERINE_SYSTEM | Freq: Once | INTRAUTERINE | Status: AC

## 2023-08-17 MED ORDER — PARAGARD INTRAUTERINE COPPER IU IUD
1.0000 | INTRAUTERINE_SYSTEM | Freq: Once | INTRAUTERINE | Status: AC
  Administered 2023-08-17: 1 via INTRAUTERINE

## 2023-08-17 NOTE — Progress Notes (Addendum)
Patient ID: Paula Riley, female   DOB: 08/20/1981, 42 y.o.   MRN: [id number]   IUD INSERTION Patient name: Paula Riley MRN [id number]  Date of birth: [birth year] Subjective Findings:   Paula Riley is a 42 y.o. [phone] African American female being seen today for removal of ParaGard IUD it is malpositioned in cervical canal per [place] 07/24/23, and reinsertion of a Paragard IUD, at her request. Patient's last menstrual period was 08/13/2023. Last sexual intercourse was unknown. Last pap10/31/24. Results were: NILM w/ HRHPV negative  The risks and benefits of the method and placement have been thouroughly reviewed with the patient and all questions were answered.  Specifically the patient is aware of failure rate of 09/998, expulsion of the IUD and of possible perforation.  The patient is aware of irregular bleeding due to the method and understands the incidence of irregular bleeding diminishes with time.  Signed copy of informed consent in chart.      08/12/2023   12:18 PM 08/07/2023   11:57 AM 07/31/2023    9:49 AM 07/29/2023   10:36 AM 07/22/2023   10:10 AM  Depression screen PHQ 2/9  Decreased Interest 0 0 0 0 0  Down, Depressed, Hopeless 0 0 0 0 0  PHQ - 2 Score 0 0 0 0 0        09/03/2020    2:33 PM 12/14/2019    9:17 AM  GAD 7 : Generalized Anxiety Score  Nervous, Anxious, on Edge 2 3  Control/stop worrying 2 3  Worry too much - different things 2 3  Trouble relaxing 2 3  Restless 2 3  Easily annoyed or irritable 1 3  Afraid - awful might happen 2 3  Total GAD 7 Score 13 21  Anxiety Difficulty  Somewhat difficult     Pertinent History Reviewed:   Reviewed past medical,surgical, social, obstetrical and family history.  Reviewed problem list, medications and allergies. Objective Findings & Procedure:   Vitals:   08/17/23 1455  BP: 125/76  Pulse: 89  Weight: 126 lb 8 oz (57.4 kg)  Height: 5\' 5"  (1.651 m)  Body mass index is 21.05 kg/m.  Results for orders placed or  performed in visit on 08/17/23 (from the past 24 hour(s))  POCT urine pregnancy   Collection Time: 08/17/23  2:59 PM  Result Value Ref Range   Preg Test, Ur Negative Negative     Time out was performed.  A Graves speculum was placed in the vagina.  The cervix was visualized,IUD strings grasped with forceps and pt coughed and IUD easily removed, Dr Charlotta Newton in to insert new ParaGard and cervix prepped using Betadine, and grasped with a single tooth tenaculum. The uterus was found to be neutral and it sounded to 6 1/2 cm.  Paragard  IUD placed per manufacturer's recommendations, and ParaGard pulled out when strings trimmed, and New IUD was reinserted by Dr Charlotta Newton. The strings were trimmed to approximately 3 cm. The patient tolerated the procedure well.    Chaperone: Malachy Mood   Assessment & Plan:    1. Pregnancy examination or test, negative result - POCT urine pregnancy  2. Encounter for IUD removal and reinsertion The patient was given post procedure instructions, including signs and symptoms of infection and to check for the strings after each menses or each month, and refraining from intercourse or anything in the vagina for 3 days. She was given a care card with date IUD placed, and date IUD to be removed. She  is scheduled for a f/u appointment 09/24/23 for IUD check.  3. Malpositioned intrauterine device (IUD), initial encounter IUD removed   4. Iron deficiency anemia due to chronic blood loss Hx anemia,, had iron infusion, will check CBC and iron panel - CBC - Iron, TIBC and Ferritin Panel  Orders Placed This Encounter  Procedures   CBC   Iron, TIBC and Ferritin Panel   POCT urine pregnancy    Return in about 5 weeks (around 09/24/2023) for iud check up with me .  Cyril Mourning NP  08/17/2023 3:47 PM

## 2023-08-17 NOTE — Addendum Note (Signed)
Addended by: Cyril Mourning A on: 08/17/2023 05:07 PM   Modules accepted: Level of Service

## 2023-08-17 NOTE — Addendum Note (Signed)
Addended by: Colen Darling on: 08/17/2023 04:24 PM   Modules accepted: Orders

## 2023-08-17 NOTE — Addendum Note (Signed)
Addended by: Cyril Mourning A on: 08/17/2023 05:01 PM   Modules accepted: Orders

## 2023-08-18 LAB — CBC
Hematocrit: 38.8 % (ref 34.0–46.6)
Hemoglobin: 12 g/dL (ref 11.1–15.9)
MCH: 28.4 pg (ref 26.6–33.0)
MCHC: 30.9 g/dL — ABNORMAL LOW (ref 31.5–35.7)
MCV: 92 fL (ref 79–97)
Platelets: 410 10*3/uL (ref 150–450)
RBC: 4.23 x10E6/uL (ref 3.77–5.28)
RDW: 19.3 % — ABNORMAL HIGH (ref 11.7–15.4)
WBC: 4.3 10*3/uL (ref 3.4–10.8)

## 2023-08-18 LAB — IRON,TIBC AND FERRITIN PANEL
Ferritin: 465 ng/mL — ABNORMAL HIGH (ref 15–150)
Iron Saturation: 35 % (ref 15–55)
Iron: 109 ug/dL (ref 27–159)
Total Iron Binding Capacity: 310 ug/dL (ref 250–450)
UIBC: 201 ug/dL (ref 131–425)

## 2023-08-20 ENCOUNTER — Telehealth: Payer: Self-pay | Admitting: *Deleted

## 2023-08-20 NOTE — Telephone Encounter (Signed)
Patient called to inform hemoglobin and ferritin are better since having infusion and hopeful she is feeling better.  Pt states she is and will see [place] in January.

## 2023-09-21 ENCOUNTER — Ambulatory Visit: Payer: Medicaid Other | Admitting: Adult Health

## 2023-09-28 ENCOUNTER — Ambulatory Visit: Payer: Medicaid Other | Admitting: Adult Health

## 2023-10-07 ENCOUNTER — Ambulatory Visit
Admission: EM | Admit: 2023-10-07 | Discharge: 2023-10-07 | Disposition: A | Discharge status: Home / Self Care | Payer: Medicaid Other

## 2023-10-07 DIAGNOSIS — M79662 Pain in left lower leg: Secondary | ICD-10-CM

## 2023-10-07 NOTE — Discharge Instructions (Signed)
As we discussed, the pain you are having is likely due to a muscle strain.  Continue Tylenol 500 to 1000 mg every 6 hours and you can take ibuprofen 800 mg every 8 hours as needed for pain.  Also recommend ice applied to the area 15 minutes on, 45 minutes off every hour while laying.  If you develop redness, warmth, red streaking going up the leg, swelling in the calf, or increasing pain, please seek emergent care.

## 2023-10-07 NOTE — ED Triage Notes (Signed)
Pt reports left leg pain, located on the inner calf, pain started this morning.

## 2023-10-07 NOTE — ED Provider Notes (Signed)
[facility]    CSN: [id number] Arrival date & time: 10/07/23  1511      History   Chief Complaint No chief complaint on file.   HPI Paula Riley is a 43 y.o. female.   Patient presents today with 1 day history of left calf pain.  She denies any injury, fall, or trauma involving her left calf.  Reports that she woke up and it was hurting.  Pain is worse when she walks around or stand on her leg.  No swelling, bruising, redness, or warmth.  She took ibuprofen earlier today without improvement.   She is concerned for a blood clot.      Past Medical History:  Diagnosis Date   Anxiety    GERD (gastroesophageal reflux disease)    Heart murmur    History of Holter monitoring 06/2010 and 11/2011   Cardionet montior with sinus tachycardia, PAC's only, no arrhythmias either monitor   Hx of echocardiogram 06/2010   normal   Mental disorder    Palpitations    Pregnancy induced hypertension     Patient Active Problem List   Diagnosis Date Noted   Encounter for IUD removal and reinsertion 08/17/2023   Malpositioned intrauterine device 08/17/2023   Routine Papanicolaou smear 07/16/2023   Pregnancy examination or test, negative result 07/16/2023   Irregular intermenstrual bleeding 02/25/2021   History of abnormal cervical Pap smear 09/03/2020   Encounter for gynecological examination with Papanicolaou smear of cervix 09/03/2020   Anxiety 09/03/2020   IUD (intrauterine device) in place 03/29/2020   Anemia 12/15/2019   COVID-19 09/19/2019   LGSIL on Pap smear of cervix 09/19/2019   Breast cyst, right 10/15/2017   Anxiety about health 05/03/2015   Sinus tachycardia 08/28/2014   Palpitations 12/02/2011   GERD (gastroesophageal reflux disease) 11/13/2011   Abdominal pain, other specified site 11/13/2011    Past Surgical History:  Procedure Laterality Date   DENTAL SURGERY     INDUCED ABORTION      OB History     Gravida  4   Para  3   Term  3    Preterm      AB  1   Living  3      SAB      IAB  1   Ectopic      Multiple  0   Live Births  3            Home Medications    Prior to Admission medications   Medication Sig Start Date End Date Taking? Authorizing Provider  Acetaminophen (TYLENOL PO) Take by mouth.    [provider]  clonazePAM (KLONOPIN) 0.5 MG tablet Take 0.5 mg by mouth daily as needed. 10/16/22   [provider]  metoprolol succinate (TOPROL-XL) 50 MG 24 hr tablet TAKE 2 TABLETS EVERY DAY WITH OR IMMEDIATELY FOLLOWING A MEAL 01/14/23   Monge, Petra Kuba, NP  PARAGARD INTRAUTERINE COPPER IU by Intrauterine route.    [provider]  Vitamin D, Ergocalciferol, (DRISDOL) 1.25 MG (50000 UNIT) CAPS capsule Take by mouth. 08/23/20   [provider]    Family History Family History  Problem Relation Age of Onset   Hypertension Other    CAD Other    Diabetes Maternal Grandmother    Breast cancer Maternal Grandmother    Healthy Son    Healthy Sister        x 6   Healthy Sister  x 2   Healthy Son     Social History Social History   Tobacco Use   Smoking status: Never   Smokeless tobacco: Never  Vaping Use   Vaping status: Never Used  Substance Use Topics   Alcohol use: No    Alcohol/week: 0.0 standard drinks of alcohol   Drug use: No     Allergies   Patient has no known allergies.   Review of Systems Review of Systems Per HPI  Physical Exam Triage Vital Signs ED Triage Vitals  Encounter Vitals Group     BP 10/07/23 1551 131/81     Systolic BP Percentile --      Diastolic BP Percentile --      Pulse Rate 10/07/23 1551 (!) 103     Resp 10/07/23 1551 17     Temp 10/07/23 1551 98.7 F (37.1 C)     Temp Source 10/07/23 1551 Oral     SpO2 10/07/23 1551 98 %     Weight --      Height --      Head Circumference --      Peak Flow --      Pain Score 10/07/23 1554 4     Pain Loc --      Pain Education --      Exclude from Growth Chart --     No data found.  Updated Vital Signs BP 131/81 (BP Location: Right Arm)   Pulse (!) 103   Temp 98.7 F (37.1 C) (Oral)   Resp 17   SpO2 98%   Visual Acuity Right Eye Distance:   Left Eye Distance:   Bilateral Distance:    Right Eye Near:   Left Eye Near:    Bilateral Near:     Physical Exam Vitals and nursing note reviewed.  Constitutional:      General: She is not in acute distress.    Appearance: Normal appearance. She is not toxic-appearing.  HENT:     Mouth/Throat:     Mouth: Mucous membranes are moist.     Pharynx: Oropharynx is clear.  Pulmonary:     Effort: Pulmonary effort is normal. No respiratory distress.  Musculoskeletal:     Comments: Inspection: no swelling, bruising, obvious deformity or redness to left lower extremity Palpation: tender to palpation left medial gastrocnemius; no obvious deformities palpated; no warmth; no increased Size in the left side; negative Homans' sign ROM: Full ROM to bilateral lower extremities Strength: 5/5 bilateral lower extremities Neurovascular: neurovascularly intact in distal bilateral lower extremities  Skin:    General: Skin is warm and dry.     Capillary Refill: Capillary refill takes less than 2 seconds.     Coloration: Skin is not jaundiced or pale.     Findings: No erythema.  Neurological:     Mental Status: She is alert and oriented to person, place, and time.  Psychiatric:        Behavior: Behavior is cooperative.      UC Treatments / Results  Labs (all labs ordered are listed, but only abnormal results are displayed) Labs Reviewed - No data to display  EKG   Radiology No results found.  Procedures Procedures (including critical care time)  Medications Ordered in UC Medications - No data to display  Initial Impression / Assessment and Plan / UC Course  I have reviewed the triage vital signs and the nursing notes.  Pertinent labs & imaging results that were available during my care of  the  patient were reviewed by me and considered in my medical decision making (see chart for details).   Patient is well-appearing, normotensive, afebrile, not tachycardic, not tachypneic, oxygenating well on room air.    1. Pain of left calf Suspect muscle strain No red flags; no risk factors for DVT and exam is not consistent with DVT today and reassurance provided I offered ordering an ultrasound of her left leg if she desires, however patient declines Supportive care discussed including elevation, ice, alternating Tylenol and ibuprofen ER precautions discussed  The patient was given the opportunity to ask questions.  All questions answered to their satisfaction.  The patient is in agreement to this plan.    Final Clinical Impressions(s) / UC Diagnoses   Final diagnoses:  Pain of left calf     Discharge Instructions      As we discussed, the pain you are having is likely due to a muscle strain.  Continue Tylenol 500 to 1000 mg every 6 hours and you can take ibuprofen 800 mg every 8 hours as needed for pain.  Also recommend ice applied to the area 15 minutes on, 45 minutes off every hour while laying.  If you develop redness, warmth, red streaking going up the leg, swelling in the calf, or increasing pain, please seek emergent care.    ED Prescriptions   None    PDMP not reviewed this encounter.   Valentino Nose, NP 10/07/23 [phone]

## 2023-10-08 ENCOUNTER — Other Ambulatory Visit: Payer: Self-pay

## 2023-10-08 ENCOUNTER — Encounter (HOSPITAL_COMMUNITY): Payer: Self-pay

## 2023-10-08 ENCOUNTER — Emergency Department (HOSPITAL_COMMUNITY)
Admission: EM | Admit: 2023-10-08 | Discharge: 2023-10-08 | Disposition: A | Discharge status: Home / Self Care | Payer: Medicaid Other

## 2023-10-08 DIAGNOSIS — M79605 Pain in left leg: Secondary | ICD-10-CM | POA: Diagnosis present

## 2023-10-08 MED ORDER — KETOROLAC TROMETHAMINE 15 MG/ML IJ SOLN
15.0000 mg | Freq: Once | INTRAMUSCULAR | Status: AC
  Administered 2023-10-08: 15 mg via INTRAMUSCULAR
  Filled 2023-10-08: qty 1

## 2023-10-08 MED ORDER — LIDOCAINE 5 % EX PTCH
1.0000 | MEDICATED_PATCH | CUTANEOUS | Status: DC
  Administered 2023-10-08: 1 via TRANSDERMAL
  Filled 2023-10-08 (×2): qty 1

## 2023-10-08 MED ORDER — KETOROLAC TROMETHAMINE 15 MG/ML IJ SOLN
15.0000 mg | Freq: Once | INTRAMUSCULAR | Status: DC
  Filled 2023-10-08: qty 1

## 2023-10-08 NOTE — ED Notes (Signed)
Patient refused medications

## 2023-10-08 NOTE — ED Provider Notes (Signed)
[place] [facility] AT [facility] Provider Note   CSN: [id number] Arrival date & time: 10/08/23  1638     History  Chief Complaint  Patient presents with   Leg Pain    Paula Riley is a 43 y.o. female without significant past medical history reporting to emergency room with complaint of left medial calf pain.  Patient was seen in urgent care yesterday for similar thing.  Has been taking Tylenol and ibuprofen.  Has any recent injury trauma or falls.  Patient has no history of DVT or PE.  No surrounding redness or sign of infection.   Leg Pain      Home Medications Prior to Admission medications   Medication Sig Start Date End Date Taking? Authorizing Provider  Acetaminophen (TYLENOL PO) Take by mouth.    [provider]  clonazePAM (KLONOPIN) 0.5 MG tablet Take 0.5 mg by mouth daily as needed. 10/16/22   [provider]  metoprolol succinate (TOPROL-XL) 50 MG 24 hr tablet TAKE 2 TABLETS EVERY DAY WITH OR IMMEDIATELY FOLLOWING A MEAL 01/14/23   Monge, Petra Kuba, NP  PARAGARD INTRAUTERINE COPPER IU by Intrauterine route.    [provider]  Vitamin D, Ergocalciferol, (DRISDOL) 1.25 MG (50000 UNIT) CAPS capsule Take by mouth. 08/23/20   [provider]      Allergies    Patient has no known allergies.    Review of Systems   Review of Systems  Physical Exam Updated Vital Signs BP 119/75 (BP Location: Right Arm)   Pulse 78   Temp 98.6 F (37 C) (Oral)   Resp 16   Ht 5\' 5"  (1.651 m)   Wt 57.4 kg   SpO2 100%   BMI 21.06 kg/m  Physical Exam  ED Results / Procedures / Treatments   Labs (all labs ordered are listed, but only abnormal results are displayed) Labs Reviewed - No data to display  EKG None  Radiology No results found.  Procedures Procedures    Medications Ordered in ED Medications - No data to display  ED Course/ Medical Decision Making/ A&P                                 Medical Decision  Making Risk Prescription drug management.   This patient presents to the ED for concern of left calf pain, this involves an extensive number of treatment options, and is a complaint that carries with it a high risk of complications and morbidity.  The differential diagnosis includes strain, sprain, cellulitis, DVT    Problem List / ED Course / Critical interventions / Medication management  Reported to emergency room with 1 day of left lower leg pain.  She does have focal area of tenderness on exam.  She is neurovascularly intact and moving extremity without difficulty. no surrounding signs of infection.  Does not appear to be swollen.  No history of DVT.  Patient is specifically concerned about blood clot.  I have lower suspicion given she has no history is not reporting any known risk factors.  I will order outpatient because ultrasound is unfortunately not available today.  She is neurovascularly intact with strong dorsal pedal pulse. No associated chest pain or shortness of breath. Stable for discharge.  Encouraged patient to try lidocaine patch and alternate Tylenol and ibuprofen as well as ice and heat. I ordered medication including Toradol, lidoderm  Reevaluation of the patient after  these medicines showed that the patient improved I have reviewed the patients home medicines and have made adjustments as needed   Plan  F/u w/ PCP in 2-3d to ensure resolution of sx.  Patient was given return precautions. Patient stable for discharge at this time.  Patient educated on sx/dx and verbalized understanding of plan. Return to ER w/ new or worsening sx.          Final Clinical Impression(s) / ED Diagnoses Final diagnoses:  Left leg pain    Rx / DC Orders ED Discharge Orders     None         Reinaldo Raddle 10/08/23 2231    Durwin Glaze, MD 10/08/23 2316

## 2023-10-08 NOTE — Discharge Instructions (Signed)
Alternate Tylenol and ibuprofen at home for pain control.  Can also use ice and heat.  I have placed outpatient DVT order.  You have to call to schedule appointment.  Please return to emergency room if you have any new or worsening symptoms.

## 2023-10-08 NOTE — ED Triage Notes (Signed)
Pt arrived from home c/o persistent left leg pain. Pt seen at Urgent Care for some complaint yesterday, and was informed it could be a stain. Pt ambulatory in Triage.

## 2023-10-13 ENCOUNTER — Ambulatory Visit (HOSPITAL_COMMUNITY)
Admission: RE | Admit: 2023-10-13 | Discharge: 2023-10-13 | Disposition: A | Discharge status: Home / Self Care | Payer: Medicaid Other | Source: Ambulatory Visit | Attending: Emergency Medicine | Admitting: Emergency Medicine

## 2023-10-13 DIAGNOSIS — M79662 Pain in left lower leg: Secondary | ICD-10-CM | POA: Diagnosis present

## 2023-10-20 ENCOUNTER — Encounter: Payer: Self-pay | Admitting: Adult Health

## 2023-10-20 ENCOUNTER — Other Ambulatory Visit: Payer: Medicaid Other | Admitting: Radiology

## 2023-10-20 ENCOUNTER — Ambulatory Visit: Payer: Medicaid Other | Admitting: Adult Health

## 2023-10-20 VITALS — BP 126/77 | HR 76 | Ht 65.0 in | Wt 122.0 lb

## 2023-10-20 DIAGNOSIS — T8332XA Displacement of intrauterine contraceptive device, initial encounter: Secondary | ICD-10-CM

## 2023-10-20 DIAGNOSIS — N921 Excessive and frequent menstruation with irregular cycle: Secondary | ICD-10-CM | POA: Diagnosis not present

## 2023-10-20 DIAGNOSIS — Z30431 Encounter for routine checking of intrauterine contraceptive device: Secondary | ICD-10-CM

## 2023-10-20 NOTE — Progress Notes (Signed)
[place]:   limited TA imaging to evaluate for IUD position Anteverted uterus normal in size with Paraguard see nin normal position, upper mid cavity, arms out.   Nl ov's bilaterally - neg adnexal regions - neg CDS - no Free fluid present

## 2023-10-20 NOTE — Progress Notes (Signed)
  Subjective:     Patient ID: Paula Riley, female   DOB: [birth year], 43 y.o.   MRN: [id number]  HPI Paula Riley is a 43 year old black female, with DP, [id number] in for IUD check. She had ParaGard  inserted 08/17/23. Had heavy period, 10/04/23 to 10/09/23, then some spotting 10/13/23.     Component Value Date/Time   DIAGPAP  07/16/2023 0937    - Negative for Intraepithelial Lesions or Malignancy (NILM)   DIAGPAP - Benign reactive/reparative changes 07/16/2023 0937   DIAGPAP  09/03/2020 1428    - Negative for intraepithelial lesion or malignancy (NILM)   HPVHIGH Negative 07/16/2023 0937   HPVHIGH Negative 09/03/2020 1428   HPVHIGH Negative 09/01/2019 1447   ADEQPAP  07/16/2023 0937    Satisfactory for evaluation; transformation zone component PRESENT.   ADEQPAP  09/03/2020 1428    Satisfactory for evaluation; transformation zone component ABSENT.   ADEQPAP  09/01/2019 1447    Satisfactory for evaluation; transformation zone component PRESENT.    PCP is Lucie Mace PA  Review of Systems For IUD check Had heavy period, 10/04/23 to 10/09/23, then some spotting 10/13/23.   Has had sex without a problem, can not feel strings  Denies being tired Reviewed past medical,surgical, social and family history. Reviewed medications and allergies.  Objective:   Physical Exam BP 126/77 (BP Location: Left Arm, Patient Position: Sitting, Cuff Size: Normal)   Pulse 76   Ht 5' 5 (1.651 m)   Wt 122 lb (55.3 kg)   LMP 10/04/2023 (Approximate)   BMI 20.30 kg/m     Skin warm and dry.  Lungs: clear to ausculation bilaterally. Cardiovascular: regular rate and rhythm. Pelvic: external genitalia is normal in appearance no lesions, vagina: mucous discharge,urethra has no lesions or masses noted, cervix:smooth and bulbous,no strings seen, uterus: normal size, shape and contour, non tender, no masses felt, adnexa: no masses or tenderness noted. Bladder is non tender and no masses felt.   Upstream - 10/20/23 0855        Pregnancy Intention Screening   Does the patient want to become pregnant in the next year? No    Does the patient's partner want to become pregnant in the next year? No    Would the patient like to discuss contraceptive options today? No      Contraception Wrap Up   Current Method IUD or IUS    End Method IUD or IUS    Contraception Counseling Provided Yes            Examination chaperoned by Joetta Molt NP student.   Assessment:     1. IUD check up (Primary) ParaGard  placed 08/17/23 No strings seen today   2. Intrauterine contraceptive device threads lost, initial encounter Will get pelvic US  10/21/23 to check placement  - US  PELVIC COMPLETE WITH TRANSVAGINAL; Future  3. Menorrhagia with irregular cycle Had heavy period in January     Plan:     US  in 1 day to check IUD placement, has lost strings

## 2023-10-21 ENCOUNTER — Other Ambulatory Visit: Payer: Medicaid Other

## 2023-10-30 ENCOUNTER — Other Ambulatory Visit (HOSPITAL_COMMUNITY): Payer: Self-pay | Admitting: Family Medicine

## 2023-10-30 DIAGNOSIS — Z1231 Encounter for screening mammogram for malignant neoplasm of breast: Secondary | ICD-10-CM

## 2023-11-12 ENCOUNTER — Ambulatory Visit (HOSPITAL_COMMUNITY)
Admission: RE | Admit: 2023-11-12 | Discharge: 2023-11-12 | Disposition: A | Discharge status: Home / Self Care | Payer: Medicaid Other | Source: Ambulatory Visit | Attending: Family Medicine | Admitting: Family Medicine

## 2023-11-12 DIAGNOSIS — Z1231 Encounter for screening mammogram for malignant neoplasm of breast: Secondary | ICD-10-CM | POA: Insufficient documentation

## 2023-12-08 ENCOUNTER — Ambulatory Visit
Admission: EM | Admit: 2023-12-08 | Discharge: 2023-12-08 | Disposition: A | Discharge status: Home / Self Care | Attending: Nurse Practitioner | Admitting: Nurse Practitioner

## 2023-12-08 DIAGNOSIS — U071 COVID-19: Secondary | ICD-10-CM

## 2023-12-08 LAB — POC COVID19/FLU A&B COMBO
Covid Antigen, POC: POSITIVE — AB
Influenza A Antigen, POC: NEGATIVE
Influenza B Antigen, POC: NEGATIVE

## 2023-12-08 MED ORDER — BENZONATATE 100 MG PO CAPS: 100.0000 mg | ORAL_CAPSULE | Freq: Three times a day (TID) | ORAL | 0 refills | Status: DC | PRN

## 2023-12-08 NOTE — ED Triage Notes (Signed)
 Pt reports she has some congestion, fever, and a cough x 2 days

## 2023-12-08 NOTE — ED Provider Notes (Signed)
 [facility]    CSN: [id number] Arrival date & time: 12/08/23  0820      History   Chief Complaint No chief complaint on file.   HPI Paula Riley is a 43 y.o. female.   Patient presents today with 2 to 3-day history of low-grade fever, Tmax 100 F, dry cough, runny and stuffy nose, slight headache behind her eyes, and fatigue.  No shortness of breath or chest pain, sore throat, ear pain, abdominal pain, nausea/vomiting, diarrhea, or change in appetite.  She took Tylenol last night which did seem to help with the fever and headache.  No known sick contacts.    Past Medical History:  Diagnosis Date   Anxiety    GERD (gastroesophageal reflux disease)    Heart murmur    History of Holter monitoring 06/2010 and 11/2011   Cardionet montior with sinus tachycardia, PAC's only, no arrhythmias either monitor   Hx of echocardiogram 06/2010   normal   Mental disorder    Palpitations    Pregnancy induced hypertension     Patient Active Problem List   Diagnosis Date Noted   IUD threads lost 10/20/2023   IUD check up 10/20/2023   Menorrhagia with irregular cycle 10/20/2023   Encounter for IUD removal and reinsertion 08/17/2023   Malpositioned intrauterine device 08/17/2023   Routine Papanicolaou smear 07/16/2023   Pregnancy examination or test, negative result 07/16/2023   Irregular intermenstrual bleeding 02/25/2021   History of abnormal cervical Pap smear 09/03/2020   Encounter for gynecological examination with Papanicolaou smear of cervix 09/03/2020   Anxiety 09/03/2020   IUD (intrauterine device) in place 03/29/2020   Anemia 12/15/2019   COVID-19 09/19/2019   LGSIL on Pap smear of cervix 09/19/2019   Breast cyst, right 10/15/2017   Anxiety about health 05/03/2015   Sinus tachycardia 08/28/2014   Palpitations 12/02/2011   GERD (gastroesophageal reflux disease) 11/13/2011   Abdominal pain, other specified site 11/13/2011    Past Surgical History:  Procedure  Laterality Date   DENTAL SURGERY     INDUCED ABORTION      OB History     Gravida  4   Para  3   Term  3   Preterm      AB  1   Living  3      SAB      IAB  1   Ectopic      Multiple  0   Live Births  3            Home Medications    Prior to Admission medications   Medication Sig Start Date End Date Taking? Authorizing Provider  benzonatate (TESSALON) 100 MG capsule Take 1 capsule (100 mg total) by mouth 3 (three) times daily as needed for cough. Do not take with alcohol or while operating or driving heavy machinery 10/28/06  Yes Cathlean Marseilles A, NP  Acetaminophen (TYLENOL PO) Take by mouth.    [provider]  clonazePAM (KLONOPIN) 0.5 MG tablet Take 0.5 mg by mouth daily as needed. 10/16/22   [provider]  metoprolol succinate (TOPROL-XL) 50 MG 24 hr tablet TAKE 2 TABLETS EVERY DAY WITH OR IMMEDIATELY FOLLOWING A MEAL 01/14/23   Monge, Petra Kuba, NP  PARAGARD INTRAUTERINE COPPER IU by Intrauterine route.    [provider]  Vitamin D, Ergocalciferol, (DRISDOL) 1.25 MG (50000 UNIT) CAPS capsule Take by mouth. 08/23/20   [provider]    Family History Family History  Problem Relation Age of Onset   Hypertension Other    CAD Other    Diabetes Maternal Grandmother    Breast cancer Maternal Grandmother    Healthy Son    Healthy Sister        x 6   Healthy Sister        x 2   Healthy Son     Social History Social History   Tobacco Use   Smoking status: Never   Smokeless tobacco: Never  Vaping Use   Vaping status: Never Used  Substance Use Topics   Alcohol use: No    Alcohol/week: 0.0 standard drinks of alcohol   Drug use: No     Allergies   Patient has no known allergies.   Review of Systems Review of Systems Per HPI  Physical Exam Triage Vital Signs ED Triage Vitals [12/08/23 0922]  Encounter Vitals Group     BP 120/80     Systolic BP Percentile      Diastolic BP Percentile      Pulse  Rate (!) 102     Resp 20     Temp 98.1 F (36.7 C)     Temp src      SpO2 97 %     Weight      Height      Head Circumference      Peak Flow      Pain Score 0     Pain Loc      Pain Education      Exclude from Growth Chart    No data found.  Updated Vital Signs BP 120/80 (BP Location: Right Arm)   Pulse (!) 102   Temp 98.1 F (36.7 C)   Resp 20   LMP 11/27/2023   SpO2 97%   Visual Acuity Right Eye Distance:   Left Eye Distance:   Bilateral Distance:    Right Eye Near:   Left Eye Near:    Bilateral Near:     Physical Exam Vitals and nursing note reviewed.  Constitutional:      General: She is not in acute distress.    Appearance: Normal appearance. She is not ill-appearing or toxic-appearing.  HENT:     Head: Normocephalic and atraumatic.     Right Ear: Tympanic membrane, ear canal and external ear normal.     Left Ear: Tympanic membrane, ear canal and external ear normal.     Nose: Congestion present. No rhinorrhea.     Mouth/Throat:     Mouth: Mucous membranes are moist.     Pharynx: Oropharynx is clear. Posterior oropharyngeal erythema present. No oropharyngeal exudate.  Eyes:     General: No scleral icterus.    Extraocular Movements: Extraocular movements intact.  Cardiovascular:     Rate and Rhythm: Normal rate and regular rhythm.  Pulmonary:     Effort: Pulmonary effort is normal. No respiratory distress.     Breath sounds: Normal breath sounds. No wheezing, rhonchi or rales.  Musculoskeletal:     Cervical back: Normal range of motion and neck supple.  Lymphadenopathy:     Cervical: No cervical adenopathy.  Skin:    General: Skin is warm and dry.     Coloration: Skin is not jaundiced or pale.     Findings: No erythema or rash.  Neurological:     Mental Status: She is alert and oriented to person, place, and time.  Psychiatric:        Behavior: Behavior is  cooperative.      UC Treatments / Results  Labs (all labs ordered are listed, but  only abnormal results are displayed) Labs Reviewed  POC COVID19/FLU A&B COMBO - Abnormal; Notable for the following components:      Result Value   Covid Antigen, POC Positive (*)    All other components within normal limits    EKG   Radiology No results found.  Procedures Procedures (including critical care time)  Medications Ordered in UC Medications - No data to display  Initial Impression / Assessment and Plan / UC Course  I have reviewed the triage vital signs and the nursing notes.  Pertinent labs & imaging results that were available during my care of the patient were reviewed by me and considered in my medical decision making (see chart for details).   Patient is well-appearing, normotensive, afebrile, not tachypneic, oxygenating well on room air.  Patient is mildly tachycardic in triage today.  1. COVID-19 Vitals and exam are stable today Symptoms are consistent with mild COVID-19 Supportive care discussed with patient ER and return precautions discussed Work excuse provided  The patient was given the opportunity to ask questions.  All questions answered to their satisfaction.  The patient is in agreement to this plan.    Final Clinical Impressions(s) / UC Diagnoses   Final diagnoses:  COVID-19     Discharge Instructions      You tested positive for COVID-19 today.  Symptoms should improve over the next week to 10 days.  If you develop chest pain or shortness of breath, go to the emergency room.  Some things that can make you feel better are: - Increased rest - Increasing fluid with water/sugar free electrolytes - Acetaminophen and ibuprofen as needed for fever/pain - Salt water gargling, chloraseptic spray and throat lozenges - OTC guaifenesin (Mucinex) 600 mg twice daily - Saline sinus flushes or a neti pot - Humidifying the air -Tessalon Perles every 8 hours as needed for dry cough      ED Prescriptions     Medication Sig Dispense Auth.  Provider   benzonatate (TESSALON) 100 MG capsule Take 1 capsule (100 mg total) by mouth 3 (three) times daily as needed for cough. Do not take with alcohol or while operating or driving heavy machinery 21 capsule Valentino Nose, NP      PDMP not reviewed this encounter.   Valentino Nose, NP 12/08/23 1027

## 2023-12-08 NOTE — Discharge Instructions (Signed)
 You tested positive for COVID-19 today.  Symptoms should improve over the next week to 10 days.  If you develop chest pain or shortness of breath, go to the emergency room.  Some things that can make you feel better are: - Increased rest - Increasing fluid with water/sugar free electrolytes - Acetaminophen and ibuprofen as needed for fever/pain - Salt water gargling, chloraseptic spray and throat lozenges - OTC guaifenesin (Mucinex) 600 mg twice daily - Saline sinus flushes or a neti pot - Humidifying the air -Tessalon Perles every 8 hours as needed for dry cough

## 2023-12-09 ENCOUNTER — Other Ambulatory Visit: Payer: Self-pay

## 2023-12-09 ENCOUNTER — Encounter (HOSPITAL_COMMUNITY): Payer: Self-pay

## 2023-12-09 ENCOUNTER — Emergency Department (HOSPITAL_COMMUNITY)

## 2023-12-09 ENCOUNTER — Emergency Department (HOSPITAL_COMMUNITY)
Admission: EM | Admit: 2023-12-09 | Discharge: 2023-12-09 | Disposition: A | Discharge status: Home / Self Care | Attending: Emergency Medicine | Admitting: Emergency Medicine

## 2023-12-09 DIAGNOSIS — Z79899 Other long term (current) drug therapy: Secondary | ICD-10-CM | POA: Diagnosis not present

## 2023-12-09 DIAGNOSIS — U071 COVID-19: Secondary | ICD-10-CM | POA: Diagnosis not present

## 2023-12-09 DIAGNOSIS — R000 Tachycardia, unspecified: Secondary | ICD-10-CM | POA: Insufficient documentation

## 2023-12-09 LAB — COMPREHENSIVE METABOLIC PANEL
ALT: 15 U/L (ref 0–44)
AST: 18 U/L (ref 15–41)
Albumin: 3.5 g/dL (ref 3.5–5.0)
Alkaline Phosphatase: 31 U/L — ABNORMAL LOW (ref 38–126)
Anion gap: 11 (ref 5–15)
BUN: 10 mg/dL (ref 6–20)
CO2: 26 mmol/L (ref 22–32)
Calcium: 8.8 mg/dL — ABNORMAL LOW (ref 8.9–10.3)
Chloride: 102 mmol/L (ref 98–111)
Creatinine, Ser: 0.65 mg/dL (ref 0.44–1.00)
GFR, Estimated: >60 mL/min (ref >60)
Glucose, Bld: 95 mg/dL (ref 70–99)
Potassium: 3.4 mmol/L — ABNORMAL LOW (ref 3.5–5.1)
Sodium: 139 mmol/L (ref 135–145)
Total Bilirubin: 0.4 mg/dL (ref 0.0–1.2)
Total Protein: 6.6 g/dL (ref 6.5–8.1)

## 2023-12-09 LAB — CBC WITH DIFFERENTIAL/PLATELET
Abs Immature Granulocytes: 0.01 10*3/uL (ref 0.00–0.07)
Basophils Absolute: 0 10*3/uL (ref 0.0–0.1)
Basophils Relative: 1 %
Eosinophils Absolute: 0 10*3/uL (ref 0.0–0.5)
Eosinophils Relative: 0 %
HCT: 37.5 % (ref 36.0–46.0)
Hemoglobin: 12.1 g/dL (ref 12.0–15.0)
Immature Granulocytes: 0 %
Lymphocytes Relative: 21 %
Lymphs Abs: 0.8 10*3/uL (ref 0.7–4.0)
MCH: 32 pg (ref 26.0–34.0)
MCHC: 32.3 g/dL (ref 30.0–36.0)
MCV: 99.2 fL (ref 80.0–100.0)
Monocytes Absolute: 0.3 10*3/uL (ref 0.1–1.0)
Monocytes Relative: 9 %
Neutro Abs: 2.8 10*3/uL (ref 1.7–7.7)
Neutrophils Relative %: 69 %
Platelets: 244 10*3/uL (ref 150–400)
RBC: 3.78 MIL/uL — ABNORMAL LOW (ref 3.87–5.11)
RDW: 13.1 % (ref 11.5–15.5)
WBC: 4 10*3/uL (ref 4.0–10.5)
nRBC: 0 % (ref 0.0–0.2)

## 2023-12-09 LAB — D-DIMER, QUANTITATIVE: D-Dimer, Quant: <0.27 ug{FEU}/mL (ref 0.00–0.50)

## 2023-12-09 LAB — MAGNESIUM: Magnesium: 1.9 mg/dL (ref 1.7–2.4)

## 2023-12-09 LAB — HCG, QUANTITATIVE, PREGNANCY: hCG, Beta Chain, Quant, S: <1 m[IU]/mL (ref <5)

## 2023-12-09 MED ORDER — LACTATED RINGERS IV BOLUS
1000.0000 mL | Freq: Once | INTRAVENOUS | Status: AC
  Administered 2023-12-09: 1000 mL via INTRAVENOUS

## 2023-12-09 MED ORDER — POTASSIUM CHLORIDE CRYS ER 20 MEQ PO TBCR: 20.0000 meq | EXTENDED_RELEASE_TABLET | Freq: Once | ORAL | Status: DC

## 2023-12-09 NOTE — ED Provider Notes (Signed)
 [place] EMERGENCY DEPARTMENT AT [facility] Provider Note   CSN: [id number] Arrival date & time: 12/09/23  1504     History  Chief Complaint  Patient presents with   Tachycardia    Paula Riley is a 43 y.o. female with PMH as listed below  via POV from home c/o tachycardia. Pt reports being Dx with Covid yesterday at Urgent Care for a few days of nasal congestion and cough.  Cough is nonproductive.  She denies any sore throat, shortness of breath, chest pain, nausea vomiting diarrhea, abdominal pain, leg swelling, lightheadedness, or passing out.  Pt reports she took 1000mg  Tylenol PTA. Pt does endorse Hx of palpitations and anxiety.  She asked has a history of sinus tachycardia for which she takes metoprolol succinate 100 mg once per day.  She did take her dose this morning has not missed any recent doses.  She also tried 1.5 mg of her Klonopin today which did not help.  She has no history of recent travel, hospitalizations, or surgeries.  She has never had a DVT or PE before.  She has birth control ParaGard IUD.  LMP was 11/27/2023.  Denies any drug or alcohol use.  Denies caffeine or nicotine use.  Denies any history of thyroid abnormalities.   Past Medical History:  Diagnosis Date   Anxiety    GERD (gastroesophageal reflux disease)    Heart murmur    History of Holter monitoring 06/2010 and 11/2011   Cardionet montior with sinus tachycardia, PAC's only, no arrhythmias either monitor   Hx of echocardiogram 06/2010   normal   Mental disorder    Palpitations    Pregnancy induced hypertension        Home Medications Prior to Admission medications   Medication Sig Start Date End Date Taking? Authorizing Provider  Acetaminophen (TYLENOL PO) Take by mouth.    [provider]  benzonatate (TESSALON) 100 MG capsule Take 1 capsule (100 mg total) by mouth 3 (three) times daily as needed for cough. Do not take with alcohol or while operating or driving heavy  machinery 12/23/79   Valentino Nose, NP  clonazePAM (KLONOPIN) 0.5 MG tablet Take 0.5 mg by mouth daily as needed. 10/16/22   [provider]  metoprolol succinate (TOPROL-XL) 50 MG 24 hr tablet TAKE 2 TABLETS EVERY DAY WITH OR IMMEDIATELY FOLLOWING A MEAL 01/14/23   Monge, Petra Kuba, NP  PARAGARD INTRAUTERINE COPPER IU by Intrauterine route.    [provider]  Vitamin D, Ergocalciferol, (DRISDOL) 1.25 MG (50000 UNIT) CAPS capsule Take by mouth. 08/23/20   [provider]      Allergies    Patient has no known allergies.    Review of Systems   Review of Systems A 10 point review of systems was performed and is negative unless otherwise reported in HPI.  Physical Exam Updated Vital Signs BP 114/78   Pulse (!) 104   Temp 97.8 F (36.6 C) (Temporal)   Resp (!) 21   Ht 5\' 5"  (1.651 m)   Wt 56.7 kg   LMP 11/27/2023 (Exact Date)   SpO2 98%   BMI 20.80 kg/m  Physical Exam General: Normal appearing female, lying in bed.  HEENT: PERRLA, Sclera anicteric, MMM, trachea midline.  Cardiology: RRR, no murmurs/rubs/gallops. BL radial and DP pulses equal bilaterally.  Resp: Normal respiratory rate and effort. CTAB, no wheezes, rhonchi, crackles.  Abd: Soft, non-tender, non-distended. No rebound tenderness or guarding.  GU: Deferred. MSK: No peripheral  edema or signs of trauma. Extremities without deformity or TTP. No cyanosis or clubbing. Skin: warm, dry. No rashes or lesions. Back: No CVA tenderness Neuro: A&Ox4, CNs II-XII grossly intact. MAEs. Sensation grossly intact.  Psych: Normal mood and affect.   ED Results / Procedures / Treatments   Labs (all labs ordered are listed, but only abnormal results are displayed) Labs Reviewed  CBC WITH DIFFERENTIAL/PLATELET - Abnormal; Notable for the following components:      Result Value   RBC 3.78 (*)    All other components within normal limits  COMPREHENSIVE METABOLIC PANEL - Abnormal; Notable for the following  components:   Potassium 3.4 (*)    Calcium 8.8 (*)    Alkaline Phosphatase 31 (*)    All other components within normal limits  MAGNESIUM  HCG, QUANTITATIVE, PREGNANCY  D-DIMER, QUANTITATIVE    EKG EKG Interpretation Date/Time:  Wednesday December 09 2023 16:44:53 EDT Ventricular Rate:  111 PR Interval:  157 QRS Duration:  94 QT Interval:  315 QTC Calculation: 428 R Axis:   57  Text Interpretation: Sinus tachycardia Consider right atrial enlargement Confirmed by Vivi Barrack [phone]) on 12/09/2023 4:52:31 PM  Radiology DG Chest Portable 1 View Result Date: 12/09/2023 CLINICAL DATA:  Tachycardia. EXAM: PORTABLE CHEST 1 VIEW COMPARISON:  Jan 21, 2021. FINDINGS: The heart size and mediastinal contours are within normal limits. Both lungs are clear. The visualized skeletal structures are unremarkable. IMPRESSION: No active disease. Electronically Signed   By: Lupita Raider M.D.   On: 12/09/2023 17:50    Procedures Procedures    Medications Ordered in ED Medications  lactated ringers bolus 1,000 mL (0 mLs Intravenous Stopped 12/09/23 1846)    ED Course/ Medical Decision Making/ A&P                          Medical Decision Making Amount and/or Complexity of Data Reviewed Labs: ordered. Decision-making details documented in ED Course. Radiology: ordered.    This patient presents to the ED for concern of tachycardia, this involves an extensive number of treatment options, and is a complaint that carries with it a high risk of complications and morbidity.  I considered the following differential and admission for this acute, potentially life threatening condition.   MDM:    The differential diagnosis for this patient's palpitations includes but is not limited to:  EKG demonstrates sinus tachycardia.  No evidence of ischemia or pericarditis.  Patient does have history of sinus tachycardia for which she takes metoprolol and took her medication today.  She is noted on arrival to  the ED to have a heart rate in the 130s sinus rhythm which comes down into the 100s to 1 teens without any intervention.  After fluids her heart rate is into the 80s and 90s.  She states that with her metoprolol that normally is in the 80s.  She has no significant electrolyte derangements though has very mild hypokalemia which is repleted.consider possible dehydration given her viral illness to potentially cause her symptoms.  She also has a negative pregnancy and dimer reassuring against DVT or PE.  She is not anemic and has no leukocytosis.  She is afebrile and has no focal symptoms of severe bacterial infection.  Chest x-ray is negative for viral or bacterial pneumonia.  She has no fever, weight loss, or diarrhea to indicate hyperthyroidism. Consider psychiatric causes such as anxiety as patient does have a history of this, and she  did take her Klonopin.  Patient's HR improves after fluids and her w/u is very reassuring. She is instructed to stay well-hydrated at home and continue to take her medications as prescribed. Stable for discharge. DC w/ discharge instructions/return precautions. All questions answered to patient's satisfaction.     Clinical Course as of 12/09/23 1816  Wed Dec 09, 2023  1731 Hemoglobin: 12.1 [HN]  1731 Magnesium: 1.9 [HN]  1731 HCG, Beta Chain, Quant, S: <1 [HN]  1731 Potassium(!): 3.4 [HN]  1814 D-Dimer, Quant: <0.27 neg [HN]    Clinical Course User Index [HN] Loetta Rough, MD    Labs: I Ordered, and personally interpreted labs.  The pertinent results include: Those listed above  Imaging Studies ordered: I ordered imaging studies including chest x-ray I independently visualized and interpreted imaging. I agree with the radiologist interpretation  Additional history obtained from chart review.    Cardiac Monitoring: The patient was maintained on a cardiac monitor.  I personally viewed and interpreted the cardiac monitored which showed an underlying rhythm  of: Sinus tachycardia, then normal sinus rhythm  Reevaluation: After the interventions noted above, I reevaluated the patient and found that they have :improved  Social Determinants of Health:  lives independently  Disposition:  DC   Co morbidities that complicate the patient evaluation  Past Medical History:  Diagnosis Date   Anxiety    GERD (gastroesophageal reflux disease)    Heart murmur    History of Holter monitoring 06/2010 and 11/2011   Cardionet montior with sinus tachycardia, PAC's only, no arrhythmias either monitor   Hx of echocardiogram 06/2010   normal   Mental disorder    Palpitations    Pregnancy induced hypertension      Medicines Meds ordered this encounter  Medications   lactated ringers bolus 1,000 mL    I have reviewed the patients home medicines and have made adjustments as needed  Problem List / ED Course: Problem List Items Addressed This Visit       Other   COVID-19   Other Visit Diagnoses       Tachycardia    -  Primary                   This note was created using dictation software, which may contain spelling or grammatical errors.    Loetta Rough, MD 12/13/23 [phone]

## 2023-12-09 NOTE — ED Triage Notes (Signed)
 Pt arrived via POV from home c/o tachycardia. Pt reports being Dx with Covid yesterday at Urgent Care. Pt reports she took 1000mg  Tylenol PTA. Pt does endorse Hx of palpitations and anxiety.

## 2023-12-09 NOTE — Discharge Instructions (Addendum)
 Thank you for coming to [facility] Emergency Department. You were seen for fast heart rate. We did an exam, labs, and imaging, and these showed no acute findings. Your heart rate came down with fluids. Please stay well hydrated at home, especially while you have covid. You can alternate taking Tylenol and ibuprofen as needed for pain. You can take 650mg  tylenol (acetaminophen) every 4-6 hours, and 600 mg ibuprofen 3 times a day.   Please follow up with your primary care provider within 1 week.   Do not hesitate to return to the ED or call 911 if you experience: -Worsening symptoms -Chest pain, shortness of breath -Lightheadedness, passing out -Fevers/chills -Anything else that concerns you

## 2023-12-22 ENCOUNTER — Telehealth: Payer: Self-pay | Admitting: Cardiology

## 2023-12-22 NOTE — Telephone Encounter (Signed)
 Patient identification verified by 2 forms. Marilynn Rail, RN    Called and spoke to patient  Patient states:   -she is having heart palpitations   -on going since yesterday   -worse with activity  -palpitations started on Monday   -has a history of palpitations   -recently had covid last week   -Takes Metoprolol 100 mg once daily  Patient denies:   -chest pain   -SOB/difficulty breathing   -lightheadedness/dizziness Offered 4/8 OV, patient declined due to work  Patient scheduled for 4/11 at 8:00am  Reviewed ED warning signs/precautions  Patient verbalized understanding, no questions at this time

## 2023-12-22 NOTE — Telephone Encounter (Signed)
 Patient c/o Palpitations:  STAT if patient reporting lightheadedness, shortness of breath, or chest pain  How long have you had palpitations/irregular HR/ Afib? Are you having the symptoms now? Patient says she is having a lot of palpitations lately- she is having some at this time  Are you currently experiencing lightheadedness, SOB or CP? no  Do you have a history of afib (atrial fibrillation) or irregular heart rhythm?   Have you checked your BP or HR? (document readings if available):   Are you experiencing any other symptoms? no

## 2023-12-25 ENCOUNTER — Encounter: Payer: Self-pay | Admitting: Nurse Practitioner

## 2023-12-25 ENCOUNTER — Ambulatory Visit: Attending: Nurse Practitioner | Admitting: Nurse Practitioner

## 2023-12-25 VITALS — BP 114/76 | HR 89 | Ht 65.0 in | Wt 122.2 lb

## 2023-12-25 DIAGNOSIS — Z87898 Personal history of other specified conditions: Secondary | ICD-10-CM

## 2023-12-25 DIAGNOSIS — E785 Hyperlipidemia, unspecified: Secondary | ICD-10-CM

## 2023-12-25 DIAGNOSIS — R002 Palpitations: Secondary | ICD-10-CM | POA: Diagnosis not present

## 2023-12-25 DIAGNOSIS — R0789 Other chest pain: Secondary | ICD-10-CM | POA: Diagnosis not present

## 2023-12-25 DIAGNOSIS — I739 Peripheral vascular disease, unspecified: Secondary | ICD-10-CM

## 2023-12-25 LAB — BASIC METABOLIC PANEL WITH GFR
BUN/Creatinine Ratio: 11 (ref 9–23)
BUN: 8 mg/dL (ref 6–24)
CO2: 22 mmol/L (ref 20–29)
Calcium: 9.4 mg/dL (ref 8.7–10.2)
Chloride: 106 mmol/L (ref 96–106)
Creatinine, Ser: 0.73 mg/dL (ref 0.57–1.00)
Glucose: 89 mg/dL (ref 70–99)
Potassium: 4.8 mmol/L (ref 3.5–5.2)
Sodium: 140 mmol/L (ref 134–144)
eGFR: 105 mL/min/{1.73_m2} (ref >59)

## 2023-12-25 NOTE — Patient Instructions (Addendum)
 Medication Instructions:  Your physician recommends that you continue on your current medications as directed. Please refer to the Current Medication list given to you today.  *If you need a refill on your cardiac medications before your next appointment, please call your pharmacy*  Lab Work: BMET today  Testing/Procedures: Complete sleep study previously ordered. (Sleep team will reach out to you to get apt scheduled).   Follow-Up: At [facility], you and your health needs are our priority.  As part of our continuing mission to provide you with exceptional heart care, our providers are all part of one team.  This team includes your primary Cardiologist (physician) and Advanced Practice Providers or APPs (Physician Assistants and Nurse Practitioners) who all work together to provide you with the care you need, when you need it.  Your next appointment:   1 month(s)  Provider:   Bernadene Person, NP        We recommend signing up for the patient portal called "MyChart".  Sign up information is provided on this After Visit Summary.  MyChart is used to connect with patients for Virtual Visits (Telemedicine).  Patients are able to view lab/test results, encounter notes, upcoming appointments, etc.  Non-urgent messages can be sent to your provider as well.   To learn more about what you can do with MyChart, go to [email or web address].   Other Instructions       1st Floor: - Lobby - Registration  - Pharmacy  - Lab - Cafe  2nd Floor: - PV Lab - Diagnostic Testing (echo, CT, nuclear med)  3rd Floor: - Vacant  4th Floor: - TCTS (cardiothoracic surgery) - AFib Clinic - Structural Heart Clinic - Vascular Surgery  - Vascular Ultrasound  5th Floor: - HeartCare Cardiology (general and EP) - Clinical Pharmacy for coumadin, hypertension, lipid, weight-loss medications, and med management appointments    Valet parking services will be available as well.

## 2023-12-25 NOTE — Progress Notes (Signed)
 Office Visit    Patient Name: Paula Riley Date of Encounter: 12/25/2023  Primary Care Provider:  Avis Epley, PA-C Primary Cardiologist:  Paula Millers, MD  Chief Complaint    43 year old female with a history of palpitations, PACs and PVCs, atypical chest pain, hyperlipidemia, mild PAD, anxiety, and snoring who presents for follow-up related to palpitations.   Past Medical History    Past Medical History:  Diagnosis Date   Anxiety    GERD (gastroesophageal reflux disease)    Heart murmur    History of Holter monitoring 06/2010 and 11/2011   Cardionet montior with sinus tachycardia, PAC's only, no arrhythmias either monitor   Hx of echocardiogram 06/2010   normal   Mental disorder    Palpitations    Pregnancy induced hypertension    Past Surgical History:  Procedure Laterality Date   DENTAL SURGERY     INDUCED ABORTION      Allergies  No Known Allergies   Labs/Other Studies Reviewed    The following studies were reviewed today:  Cardiac Studies & Procedures   ______________________________________________________________________________________________     ECHOCARDIOGRAM  ECHOCARDIOGRAM COMPLETE 05/12/2019  Narrative ECHOCARDIOGRAM REPORT    Patient Name:   ARLYSS Riley Date of Exam: 05/12/2019 Medical Rec #:  [id number]     Height:       65.0 in Accession #:    [id number]    Weight:       110.0 lb Date of Birth:  1981-09-15      BSA:          1.53 m Patient Age:    38 years      BP:           120/78 mmHg Patient Gender: F             HR:           81 bpm. Exam Location:  [facility]   Procedure: 2D Echo  Indications:    Palpitations 785.1 / R00.2  History:        Patient has prior history of Echocardiogram examinations, most recent 06/27/2010. Signs/Symptoms: Murmur Risk Factors: Non-Smoker. GERD, HPV, Sinus Tachycardia, Palpitations.  Sonographer:    Paula Riley RDCS (AE) Referring Phys: [id number] Paula S  Riley  IMPRESSIONS   1. The left ventricle has normal systolic function, with an ejection fraction of 55-60%. The cavity size was normal. Left ventricular diastolic parameters were normal. 2. The right ventricle has normal systolic function. The cavity was normal. There is no increase in right ventricular wall thickness. 3. The aorta is normal unless otherwise noted. 4. The aortic root is normal in size and structure.  FINDINGS Left Ventricle: The left ventricle has normal systolic function, with an ejection fraction of 55-60%. The cavity size was normal. There is no increase in left ventricular wall thickness. Left ventricular diastolic parameters were normal.  Right Ventricle: The right ventricle has normal systolic function. The cavity was normal. There is no increase in right ventricular wall thickness.  Left Atrium: Left atrial size was normal in size.  Right Atrium: Right atrial size was normal in size. Right atrial pressure is estimated at 10 mmHg.  Interatrial Septum: No atrial level shunt detected by color flow Doppler.  Pericardium: There is no evidence of pericardial effusion.  Mitral Valve: The mitral valve is normal in structure. Mitral valve regurgitation is not visualized by color flow Doppler.  Tricuspid Valve: The tricuspid valve is normal in structure. Tricuspid valve regurgitation is  trivial by color flow Doppler.  Aortic Valve: The aortic valve is normal in structure. Aortic valve regurgitation was not visualized by color flow Doppler.  Pulmonic Valve: The pulmonic valve was not well visualized. Pulmonic valve regurgitation is not visualized by color flow Doppler.  Aorta: The aortic root is normal in size and structure. The aorta is normal unless otherwise noted.  Venous: The inferior vena cava is normal in size with greater than 50% respiratory variability.   +--------------+--------++ LEFT VENTRICLE          +----------------+----------++ +--------------+--------++ Diastology                 PLAX 2D                +----------------+----------++ +--------------+--------++ LV e' lateral:  17.00 cm/s LVIDd:        3.93 cm  +----------------+----------++ +--------------+--------++ LV E/e' lateral:5.3        LVIDs:        2.66 cm  +----------------+----------++ +--------------+--------++ LV e' medial:   12.20 cm/s LV PW:        0.78 cm  +----------------+----------++ +--------------+--------++ LV E/e' medial: 7.4        LV IVS:       0.66 cm  +----------------+----------++ +--------------+--------++ LVOT diam:    1.90 cm  +--------------+--------++ LV SV:        41 ml    +--------------+--------++ LV SV Index:  27.31    +--------------+--------++ LVOT Area:    2.84 cm +--------------+--------++                        +--------------+--------++  +---------------+----------++ RIGHT VENTRICLE           +---------------+----------++ RV S prime:    13.80 cm/s +---------------+----------++ TAPSE (M-mode):2.3 cm     +---------------+----------++  +---------------+-------++-----------++ LEFT ATRIUM           Index       +---------------+-------++-----------++ LA diam:       2.70 cm1.76 cm/m  +---------------+-------++-----------++ LA Vol (A2C):  29.0 ml18.90 ml/m +---------------+-------++-----------++ LA Vol (A4C):  32.4 ml21.12 ml/m +---------------+-------++-----------++ LA Biplane Vol:33.7 ml21.96 ml/m +---------------+-------++-----------++ +------------+---------++-----------++ RIGHT ATRIUM         Index       +------------+---------++-----------++ RA Pressure:3.00 mmHg            +------------+---------++-----------++ RA Area:    9.19 cm             +------------+---------++-----------++ RA Volume:  17.30 ml 11.28  ml/m +------------+---------++-----------++  +-------------+-------++ AORTA                +-------------+-------++ Ao Root diam:2.30 cm +-------------+-------++  +--------------+----------++ +---------------+---------++ MITRAL VALVE             TRICUSPID VALVE          +--------------+----------++ +---------------+---------++ MV Area (PHT):5.75 cm   Estimated RAP: 3.00 mmHg +--------------+----------++ +---------------+---------++ MV PHT:       38.28 msec +--------------+----------++ +--------------+-------+ MV Decel Time:132 msec   SHUNTS                +--------------+----------++ +--------------+-------+ +--------------+----------++ Systemic Diam:1.90 cm MV E velocity:90.30 cm/s +--------------+-------+ +--------------+----------++ MV A velocity:77.70 cm/s +--------------+----------++ MV E/A ratio: 1.16       +--------------+----------++   Dietrich Pates MD Electronically signed by Dietrich Pates MD Signature Date/Time: 05/12/2019/4:04:12 PM    Final          ______________________________________________________________________________________________     Recent Labs: 07/16/2023: TSH  1.610 12/09/2023: ALT 15; BUN 10; Creatinine, Ser 0.65; Hemoglobin 12.1; Magnesium 1.9; Platelets 244; Potassium 3.4; Sodium 139  Recent Lipid Panel    Component Value Date/Time   CHOL 240 (H) 01/23/2022 0932   TRIG 35 01/23/2022 0932   HDL 84 01/23/2022 0932   CHOLHDL 2.9 01/23/2022 0932   LDLCALC 151 (H) 01/23/2022 0932    History of Present Illness    43 year old female with the above past medical history including palpitations, PACs and PVCs, atypical chest pain, hyperlipidemia, mild PAD, anxiety, and snoring.   Previous monitor in October 2011 showed no significant arrhythmia.  CardioNet in 2013 revealed rhythm, sinus tach since.  Repeat 2016 showed sinus with PACs.  Holter monitor in 2019 showed sinus bradycardia, normal  sinus rhythm, sinus tachycardia, occasional PACs and rare PVCs.  Echocardiogram in August 2020 showed normal LV systolic function.  ABIs in June 2022 showed mild PAD bilaterally.  She was last seen in the office on 01/01/2023 and was stable from a cardiac standpoint.  She denies any significant palpitations.  She underwent home sleep study in 12/2022 which was nondiagnostic due to lack of adequate sleep time.  Lab sleep study was recommended, but not completed.  She was seen in the ED on 12/08/2023 in the setting of tachycardia, workup was unremarkable overall though she was positive for COVID-19, potassium was 3.4. She contacted our office on 12/22/2023 with complaints of increased palpitations following COVID-19 infection.  She presents today for follow-up. Since her last visit she has been stable overall from a cardiac standpoint. She continues to note intermittent fleeting palpitations, overall improved. She denies any chest pain, dizziness, dyspnea, presyncope or syncope. She does report increased levels of stress and anxiety, she feels this, as well as her recent COVID-19 infection have likely contributed to her symptoms.   Home Medications    Current Outpatient Medications  Medication Sig Dispense Refill   Acetaminophen (TYLENOL PO) Take by mouth.     clonazePAM (KLONOPIN) 0.5 MG tablet Take 0.5 mg by mouth daily as needed.     metoprolol succinate (TOPROL-XL) 50 MG 24 hr tablet TAKE 2 TABLETS EVERY DAY WITH OR IMMEDIATELY FOLLOWING A MEAL 180 tablet 3   PARAGARD INTRAUTERINE COPPER IU by Intrauterine route.     Vitamin D, Ergocalciferol, (DRISDOL) 1.25 MG (50000 UNIT) CAPS capsule Take by mouth.     benzonatate (TESSALON) 100 MG capsule Take 1 capsule (100 mg total) by mouth 3 (three) times daily as needed for cough. Do not take with alcohol or while operating or driving heavy machinery (Patient not taking: Reported on 12/25/2023) 21 capsule 0   Current Facility-Administered Medications  Medication  Dose Route Frequency Provider Last Rate Last Admin   paragard intrauterine copper IUD 1 each  1 each Intrauterine Once          Review of Systems    She denies chest pain, dyspnea, pnd, orthopnea, n, v, dizziness, syncope, edema, weight gain, or early satiety. All other systems reviewed and are otherwise negative except as noted above.   Physical Exam    VS:  BP 114/76 (BP Location: Left Arm, Patient Position: Sitting, Cuff Size: Normal)   Pulse 89   Ht 5\' 5"  (1.651 m)   Wt 122 lb 3.2 oz (55.4 kg)   LMP 11/27/2023 (Exact Date)   SpO2 100%   BMI 20.34 kg/m   GEN: Well nourished, well developed, in no acute distress. HEENT: normal. Neck: Supple, no JVD, carotid bruits, or masses.  Cardiac: RRR, no murmurs, rubs, or gallops. No clubbing, cyanosis, edema.  Radials/DP/PT 2+ and equal bilaterally.  Respiratory:  Respirations regular and unlabored, clear to auscultation bilaterally. GI: Soft, nontender, nondistended, BS + x 4. MS: no deformity or atrophy. Skin: warm and dry, no rash. Neuro:  Strength and sensation are intact. Psych: Normal affect.  Accessory Clinical Findings    ECG personally reviewed by me today - EKG Interpretation Date/Time:  Friday December 25 2023 08:11:00 EDT Ventricular Rate:  89 PR Interval:  162 QRS Duration:  76 QT Interval:  358 QTC Calculation: 435 R Axis:   73  Text Interpretation: Normal sinus rhythm Normal ECG When compared with ECG of 09-Dec-2023 16:44, PREVIOUS ECG IS PRESENT Confirmed by Bernadene Person ([id number]) on 12/25/2023 8:24:06 AM  - no acute changes.   Lab Results  Component Value Date   WBC 4.0 12/09/2023   HGB 12.1 12/09/2023   HCT 37.5 12/09/2023   MCV 99.2 12/09/2023   PLT 244 12/09/2023   Lab Results  Component Value Date   CREATININE 0.65 12/09/2023   BUN 10 12/09/2023   NA 139 12/09/2023   K 3.4 (L) 12/09/2023   CL 102 12/09/2023   CO2 26 12/09/2023   Lab Results  Component Value Date   ALT 15 12/09/2023   AST 18  12/09/2023   ALKPHOS 31 (L) 12/09/2023   BILITOT 0.4 12/09/2023   Lab Results  Component Value Date   CHOL 240 (H) 01/23/2022   HDL 84 01/23/2022   LDLCALC 151 (H) 01/23/2022   TRIG 35 01/23/2022   CHOLHDL 2.9 01/23/2022    No results found for: "HGBA1C"  Assessment & Plan    1. Palpitations: Prior monitor showed sinus rhythm with PACs, PVCs. She reports a recent increase in palpitations in the setting of recent COVID-19 infection, increased stress and anxiety.  She describes her palpitations as a "extra beat," symptoms are fleeting, denies any associated symptoms.  Her symptoms are gradually improving, though they do cause her some anxiety.  We discussed possible 7-day ZIO, as needed metoprolol, she declines at this time.  Continue to monitor symptoms. Will check BMET today given recent hypokalemia. Encouraged adequate hydration, stress management.  Continue metoprolol.   2. Atypical chest pain: Echo in August 2020 showed normal LV systolic function. Stable with no anginal symptoms. No indication for ischemic evaluation.    3. Hyperlipidemia: LDL was 125 in 10/2022. Declines statin therapy.    4. History of snoring: She underwent home sleep study in 12/2022 which was nondiagnostic due to lack of adequate sleep time.  In-lab sleep study was recommended, but not completed.  We will reach out to our sleep medicine team to see if she can be scheduled for in lab study.   5. Mild PAD: ABIs in June 2022 showed mild PAD bilaterally. Denies claudication. No indication for repeat study at this time.    6. Disposition: Follow-up in 1 month.       Joylene Grapes, NP 12/25/2023, 9:52 AM

## 2023-12-30 ENCOUNTER — Telehealth: Payer: Self-pay

## 2023-12-30 NOTE — Telephone Encounter (Signed)
Spoke with pt. Pt was notified of lab results. Pt will continue current medication and f/u as planned.  

## 2024-01-08 ENCOUNTER — Other Ambulatory Visit: Payer: Self-pay | Admitting: Nurse Practitioner

## 2024-01-25 ENCOUNTER — Encounter: Payer: Self-pay | Admitting: Nurse Practitioner

## 2024-01-25 ENCOUNTER — Ambulatory Visit: Attending: Nurse Practitioner | Admitting: Nurse Practitioner

## 2024-01-25 VITALS — BP 110/76 | HR 93 | Ht 65.0 in | Wt 121.0 lb

## 2024-01-25 DIAGNOSIS — R002 Palpitations: Secondary | ICD-10-CM | POA: Diagnosis present

## 2024-01-25 DIAGNOSIS — I739 Peripheral vascular disease, unspecified: Secondary | ICD-10-CM | POA: Diagnosis present

## 2024-01-25 DIAGNOSIS — R0789 Other chest pain: Secondary | ICD-10-CM | POA: Insufficient documentation

## 2024-01-25 DIAGNOSIS — E785 Hyperlipidemia, unspecified: Secondary | ICD-10-CM | POA: Diagnosis present

## 2024-01-25 DIAGNOSIS — Z87898 Personal history of other specified conditions: Secondary | ICD-10-CM | POA: Insufficient documentation

## 2024-01-25 NOTE — Progress Notes (Signed)
 Office Visit    Patient Name: Paula Riley Date of Encounter: 01/25/2024  Primary Care Provider:  Roxene Cora, PA-C Primary Cardiologist:  Alexandria Angel, MD  Chief Complaint    43 year old female with a history of palpitations, PACs and PVCs, atypical chest pain, hyperlipidemia, mild PAD, anxiety, and snoring who presents for follow-up related to palpitations.   Past Medical History    Past Medical History:  Diagnosis Date   Anxiety    GERD (gastroesophageal reflux disease)    Heart murmur    History of Holter monitoring 06/2010 and 11/2011   Cardionet montior with sinus tachycardia, PAC's only, no arrhythmias either monitor   Hx of echocardiogram 06/2010   normal   Mental disorder    Palpitations    Pregnancy induced hypertension    Past Surgical History:  Procedure Laterality Date   DENTAL SURGERY     INDUCED ABORTION      Allergies  No Known Allergies   Labs/Other Studies Reviewed    The following studies were reviewed today:  Cardiac Studies & Procedures   ______________________________________________________________________________________________     ECHOCARDIOGRAM  ECHOCARDIOGRAM COMPLETE 05/12/2019  Narrative ECHOCARDIOGRAM REPORT    Patient Name:   Paula Riley Date of Exam: 05/12/2019 Medical Rec #:  [id number]     Height:       65.0 in Accession #:    [id number]    Weight:       110.0 lb Date of Birth:  Jan 10, 1981      BSA:          1.53 m Patient Age:    38 years      BP:           120/78 mmHg Patient Gender: F             HR:           81 bpm. Exam Location:  [facility]   Procedure: 2D Echo  Indications:    Palpitations 785.1 / R00.2  History:        Patient has prior history of Echocardiogram examinations, most recent 06/27/2010. Signs/Symptoms: Murmur Risk Factors: Non-Smoker. GERD, HPV, Sinus Tachycardia, Palpitations.  Sonographer:    Gelene Kelly RDCS (AE) Referring Phys: [id number] BRIAN S  CRENSHAW  IMPRESSIONS   1. The left ventricle has normal systolic function, with an ejection fraction of 55-60%. The cavity size was normal. Left ventricular diastolic parameters were normal. 2. The right ventricle has normal systolic function. The cavity was normal. There is no increase in right ventricular wall thickness. 3. The aorta is normal unless otherwise noted. 4. The aortic root is normal in size and structure.  FINDINGS Left Ventricle: The left ventricle has normal systolic function, with an ejection fraction of 55-60%. The cavity size was normal. There is no increase in left ventricular wall thickness. Left ventricular diastolic parameters were normal.  Right Ventricle: The right ventricle has normal systolic function. The cavity was normal. There is no increase in right ventricular wall thickness.  Left Atrium: Left atrial size was normal in size.  Right Atrium: Right atrial size was normal in size. Right atrial pressure is estimated at 10 mmHg.  Interatrial Septum: No atrial level shunt detected by color flow Doppler.  Pericardium: There is no evidence of pericardial effusion.  Mitral Valve: The mitral valve is normal in structure. Mitral valve regurgitation is not visualized by color flow Doppler.  Tricuspid Valve: The tricuspid valve is normal in structure. Tricuspid valve regurgitation is  trivial by color flow Doppler.  Aortic Valve: The aortic valve is normal in structure. Aortic valve regurgitation was not visualized by color flow Doppler.  Pulmonic Valve: The pulmonic valve was not well visualized. Pulmonic valve regurgitation is not visualized by color flow Doppler.  Aorta: The aortic root is normal in size and structure. The aorta is normal unless otherwise noted.  Venous: The inferior vena cava is normal in size with greater than 50% respiratory variability.   +--------------+--------++ LEFT VENTRICLE          +----------------+----------++ +--------------+--------++ Diastology                 PLAX 2D                +----------------+----------++ +--------------+--------++ LV e' lateral:  17.00 cm/s LVIDd:        3.93 cm  +----------------+----------++ +--------------+--------++ LV E/e' lateral:5.3        LVIDs:        2.66 cm  +----------------+----------++ +--------------+--------++ LV e' medial:   12.20 cm/s LV PW:        0.78 cm  +----------------+----------++ +--------------+--------++ LV E/e' medial: 7.4        LV IVS:       0.66 cm  +----------------+----------++ +--------------+--------++ LVOT diam:    1.90 cm  +--------------+--------++ LV SV:        41 ml    +--------------+--------++ LV SV Index:  27.31    +--------------+--------++ LVOT Area:    2.84 cm +--------------+--------++                        +--------------+--------++  +---------------+----------++ RIGHT VENTRICLE           +---------------+----------++ RV S prime:    13.80 cm/s +---------------+----------++ TAPSE (M-mode):2.3 cm     +---------------+----------++  +---------------+-------++-----------++ LEFT ATRIUM           Index       +---------------+-------++-----------++ LA diam:       2.70 cm1.76 cm/m  +---------------+-------++-----------++ LA Vol (A2C):  29.0 ml18.90 ml/m +---------------+-------++-----------++ LA Vol (A4C):  32.4 ml21.12 ml/m +---------------+-------++-----------++ LA Biplane Vol:33.7 ml21.96 ml/m +---------------+-------++-----------++ +------------+---------++-----------++ RIGHT ATRIUM         Index       +------------+---------++-----------++ RA Pressure:3.00 mmHg            +------------+---------++-----------++ RA Area:    9.19 cm             +------------+---------++-----------++ RA Volume:  17.30 ml 11.28  ml/m +------------+---------++-----------++  +-------------+-------++ AORTA                +-------------+-------++ Ao Root diam:2.30 cm +-------------+-------++  +--------------+----------++ +---------------+---------++ MITRAL VALVE             TRICUSPID VALVE          +--------------+----------++ +---------------+---------++ MV Area (PHT):5.75 cm   Estimated RAP: 3.00 mmHg +--------------+----------++ +---------------+---------++ MV PHT:       38.28 msec +--------------+----------++ +--------------+-------+ MV Decel Time:132 msec   SHUNTS                +--------------+----------++ +--------------+-------+ +--------------+----------++ Systemic Diam:1.90 cm MV E velocity:90.30 cm/s +--------------+-------+ +--------------+----------++ MV A velocity:77.70 cm/s +--------------+----------++ MV E/A ratio: 1.16       +--------------+----------++   Ola Berger MD Electronically signed by Ola Berger MD Signature Date/Time: 05/12/2019/4:04:12 PM    Final          ______________________________________________________________________________________________     Recent Labs: 07/16/2023: TSH  1.610 12/09/2023: ALT 15; Hemoglobin 12.1; Magnesium  1.9; Platelets 244 12/25/2023: BUN 8; Creatinine, Ser 0.73; Potassium 4.8; Sodium 140  Recent Lipid Panel    Component Value Date/Time   CHOL 240 (H) 01/23/2022 0932   TRIG 35 01/23/2022 0932   HDL 84 01/23/2022 0932   CHOLHDL 2.9 01/23/2022 0932   LDLCALC 151 (H) 01/23/2022 0932    History of Present Illness    43 year old female with the above past medical history including palpitations, PACs and PVCs, atypical chest pain, hyperlipidemia, mild PAD, anxiety, and snoring.   Previous monitor in October 2011 showed no significant arrhythmia.  CardioNet in 2013 revealed rhythm, sinus tach since.  Repeat 2016 showed sinus with PACs.  Holter monitor in 2019 showed sinus bradycardia,  normal sinus rhythm, sinus tachycardia, occasional PACs and rare PVCs.  Echocardiogram in August 2020 showed normal LV systolic function.  ABIs in June 2022 showed mild PAD bilaterally.  Home sleep study in 12/2022 which was nondiagnostic due to lack of adequate sleep time.  Lab sleep study was recommended, but not completed.  She was seen in the ED on 12/08/2023 in the setting of tachycardia, workup was unremarkable overall though she was positive for COVID-19, potassium was 3.4. She contacted our office on 12/22/2023 with complaints of increased palpitations following COVID-19 infection.  She was last seen in the office on 12/25/2023 and was stable from a cardiac standpoint.  She reported intermittent fleeting palpitations, overall improved.  She declined ZIO monitor.  She presents today for follow-up. Since her last visit she has done well from a cardiac standpoint.  She notes rare fleeting palpitations, she will have an episode 1 time per week, she denies any associated symptoms.  She never heard back regarding possible sleep study.  She is asking about following up on this today.  Otherwise, she reports feeling well.  Home Medications    Current Outpatient Medications  Medication Sig Dispense Refill   Acetaminophen  (TYLENOL  PO) Take by mouth.     benzonatate  (TESSALON ) 100 MG capsule Take 1 capsule (100 mg total) by mouth 3 (three) times daily as needed for cough. Do not take with alcohol or while operating or driving heavy machinery 21 capsule 0   clonazePAM (KLONOPIN) 0.5 MG tablet Take 0.5 mg by mouth daily as needed.     metoprolol  succinate (TOPROL -XL) 50 MG 24 hr tablet TAKE 2 TABLETS BY MOUTH EVERY DAY WITH OR IMMEDIATELY FOLLOWING A MEAL 180 tablet 3   PARAGARD  INTRAUTERINE COPPER  IU by Intrauterine route.     Vitamin D , Ergocalciferol , (DRISDOL) 1.25 MG (50000 UNIT) CAPS capsule Take by mouth.     Current Facility-Administered Medications  Medication Dose Route Frequency Provider Last Rate  Last Admin   paragard  intrauterine copper  IUD 1 each  1 each Intrauterine Once          Review of Systems    She denies chest pain, dyspnea, pnd, orthopnea, n, v, dizziness, syncope, edema, weight gain, or early satiety. All other systems reviewed and are otherwise negative except as noted above.   Physical Exam    VS:  BP 110/76 (BP Location: Right Arm, Patient Position: Sitting, Cuff Size: Normal)   Pulse 93   Ht 5\' 5"  (1.651 m)   Wt 121 lb (54.9 kg)   SpO2 99%   BMI 20.14 kg/m   GEN: Well nourished, well developed, in no acute distress. HEENT: normal. Neck: Supple, no JVD, carotid bruits, or masses. Cardiac: RRR, no murmurs, rubs, or gallops.  No clubbing, cyanosis, edema.  Radials/DP/PT 2+ and equal bilaterally.  Respiratory:  Respirations regular and unlabored, clear to auscultation bilaterally. GI: Soft, nontender, nondistended, BS + x 4. MS: no deformity or atrophy. Skin: warm and dry, no rash. Neuro:  Strength and sensation are intact. Psych: Normal affect.  Accessory Clinical Findings    ECG personally reviewed by me today -    - no EKG in office today.    Lab Results  Component Value Date   WBC 4.0 12/09/2023   HGB 12.1 12/09/2023   HCT 37.5 12/09/2023   MCV 99.2 12/09/2023   PLT 244 12/09/2023   Lab Results  Component Value Date   CREATININE 0.73 12/25/2023   BUN 8 12/25/2023   NA 140 12/25/2023   K 4.8 12/25/2023   CL 106 12/25/2023   CO2 22 12/25/2023   Lab Results  Component Value Date   ALT 15 12/09/2023   AST 18 12/09/2023   ALKPHOS 31 (L) 12/09/2023   BILITOT 0.4 12/09/2023   Lab Results  Component Value Date   CHOL 240 (H) 01/23/2022   HDL 84 01/23/2022   LDLCALC 151 (H) 01/23/2022   TRIG 35 01/23/2022   CHOLHDL 2.9 01/23/2022    No results found for: "HGBA1C"  Assessment & Plan    1. Palpitations: Prior monitor showed sinus rhythm with PACs, PVCs.  Recent increase in palpitations following COVID-19 infection, increased stress and  anxiety.  She declined cardiac monitor.  She notes rare fleeting palpitations, 1 episode per week, denies any associated symptoms.  Continue to monitor symptoms.  No indication for monitor at this time.  Encouraged adequate hydration, stress management.  Continue metoprolol .   2. Atypical chest pain: Echo in August 2020 showed normal LV systolic function. Stable with no anginal symptoms. No indication for ischemic evaluation.    3. Hyperlipidemia: LDL was 125 in 10/2022. Declines statin therapy.    4. History of snoring: She underwent home sleep study in 12/2022 which was nondiagnostic due to lack of adequate sleep time.  In-lab sleep study was recommended, but not completed.  We will reach out to our sleep medicine team to see if she can be scheduled for in lab study.   5. Mild PAD: ABIs in June 2022 showed mild PAD bilaterally. Denies claudication. No indication for repeat study at this time.    6. Disposition: Follow-up in 6 months.         Jude Norton, NP 01/25/2024, 9:41 AM

## 2024-01-25 NOTE — Patient Instructions (Signed)
 Medication Instructions:  Your physician recommends that you continue on your current medications as directed. Please refer to the Current Medication list given to you today.  *If you need a refill on your cardiac medications before your next appointment, please call your pharmacy*  Lab Work: NONE ordered at this time of appointment   Testing/Procedures: NONE ordered at this time of appointment   Follow-Up: At [facility], you and your health needs are our priority.  As part of our continuing mission to provide you with exceptional heart care, our providers are all part of one team.  This team includes your primary Cardiologist (physician) and Advanced Practice Providers or APPs (Physician Assistants and Nurse Practitioners) who all work together to provide you with the care you need, when you need it.  Your next appointment:   6 month(s)  Provider:   Alexandria Angel, MD or Marlana Silvan, NP          We recommend signing up for the patient portal called "MyChart".  Sign up information is provided on this After Visit Summary.  MyChart is used to connect with patients for Virtual Visits (Telemedicine).  Patients are able to view lab/test results, encounter notes, upcoming appointments, etc.  Non-urgent messages can be sent to your provider as well.   To learn more about what you can do with MyChart, go to [email or web address].

## 2024-03-09 ENCOUNTER — Telehealth: Payer: Self-pay | Admitting: Nurse Practitioner

## 2024-03-09 NOTE — Telephone Encounter (Signed)
 Spoke with pt, Aware of dr vertie recommendations. She does report an increase in stress, aware that can increase her palpitations. She will reach out to her medical doctor for something to take for stress. She requested an appointment to be seen. Follow up scheduled

## 2024-03-09 NOTE — Telephone Encounter (Signed)
 Patient c/o Palpitations:  STAT if patient reporting lightheadedness, shortness of breath, or chest pain  How long have you had palpitations/irregular HR/ Afib? Are you having the symptoms now? Since yesterday, yes  Are you currently experiencing lightheadedness, SOB or CP? no  Do you have a history of afib (atrial fibrillation) or irregular heart rhythm? yes  Have you checked your BP or HR? (document readings if available): no  Are you experiencing any other symptoms? no

## 2024-03-09 NOTE — Telephone Encounter (Signed)
 Patient identification verified by 2 forms. Shirl Ellen, RN     Called and spoke to patient  Patient states:  - palpitations that started yesterday that last only for few seconds.   Patient denies:  - Caffeine use. Diet changes  - SOB, chest pain, dizziness, vision changes,              Interventions/Plan: - Encounter forwarded to primary cardiologist for review/recommendations.  - patient okay to wear monitor at this time if recommended.    Reviewed ED warning signs/precautions  Patient agrees with plan, no questions at this time

## 2024-03-15 NOTE — Progress Notes (Deleted)
 Office Visit    Patient Name: Paula Riley Date of Encounter: 03/15/2024  Primary Care Provider:  Leonce Lucie PARAS, PA-C Primary Cardiologist:  Redell Shallow, MD  Chief Complaint    43 year old female with a history of palpitations, PACs and PVCs, atypical chest pain, hyperlipidemia, mild PAD, anxiety, and snoring who presents for follow-up related to palpitations.   Past Medical History    Past Medical History:  Diagnosis Date   Anxiety    GERD (gastroesophageal reflux disease)    Heart murmur    History of Holter monitoring 06/2010 and 11/2011   Cardionet montior with sinus tachycardia, PAC's only, no arrhythmias either monitor   Hx of echocardiogram 06/2010   normal   Mental disorder    Palpitations    Pregnancy induced hypertension    Past Surgical History:  Procedure Laterality Date   DENTAL SURGERY     INDUCED ABORTION      Allergies  No Known Allergies   Labs/Other Studies Reviewed    The following studies were reviewed today:  Cardiac Studies & Procedures   ______________________________________________________________________________________________     ECHOCARDIOGRAM  ECHOCARDIOGRAM COMPLETE 05/12/2019  Narrative ECHOCARDIOGRAM REPORT    Patient Name:   Paula Riley Date of Exam: 05/12/2019 Medical Rec #:  [id number]     Height:       65.0 in Accession #:    [id number]    Weight:       110.0 lb Date of Birth:  [birth year]      BSA:          1.53 m Patient Age:    38 years      BP:           120/78 mmHg Patient Gender: F             HR:           81 bpm. Exam Location:  [facility]   Procedure: 2D Echo  Indications:    Palpitations 785.1 / R00.2  History:        Patient has prior history of Echocardiogram examinations, most recent 06/27/2010. Signs/Symptoms: Murmur Risk Factors: Non-Smoker. GERD, HPV, Sinus Tachycardia, Palpitations.  Sonographer:    Orvil Holmes RDCS (AE) Referring Phys: [id number] BRIAN S  CRENSHAW  IMPRESSIONS   1. The left ventricle has normal systolic function, with an ejection fraction of 55-60%. The cavity size was normal. Left ventricular diastolic parameters were normal. 2. The right ventricle has normal systolic function. The cavity was normal. There is no increase in right ventricular wall thickness. 3. The aorta is normal unless otherwise noted. 4. The aortic root is normal in size and structure.  FINDINGS Left Ventricle: The left ventricle has normal systolic function, with an ejection fraction of 55-60%. The cavity size was normal. There is no increase in left ventricular wall thickness. Left ventricular diastolic parameters were normal.  Right Ventricle: The right ventricle has normal systolic function. The cavity was normal. There is no increase in right ventricular wall thickness.  Left Atrium: Left atrial size was normal in size.  Right Atrium: Right atrial size was normal in size. Right atrial pressure is estimated at 10 mmHg.  Interatrial Septum: No atrial level shunt detected by color flow Doppler.  Pericardium: There is no evidence of pericardial effusion.  Mitral Valve: The mitral valve is normal in structure. Mitral valve regurgitation is not visualized by color flow Doppler.  Tricuspid Valve: The tricuspid valve is normal in structure. Tricuspid valve regurgitation is  trivial by color flow Doppler.  Aortic Valve: The aortic valve is normal in structure. Aortic valve regurgitation was not visualized by color flow Doppler.  Pulmonic Valve: The pulmonic valve was not well visualized. Pulmonic valve regurgitation is not visualized by color flow Doppler.  Aorta: The aortic root is normal in size and structure. The aorta is normal unless otherwise noted.  Venous: The inferior vena cava is normal in size with greater than 50% respiratory variability.   +--------------+--------++ LEFT VENTRICLE          +----------------+----------++ +--------------+--------++ Diastology                 PLAX 2D                +----------------+----------++ +--------------+--------++ LV e' lateral:  17.00 cm/s LVIDd:        3.93 cm  +----------------+----------++ +--------------+--------++ LV E/e' lateral:5.3        LVIDs:        2.66 cm  +----------------+----------++ +--------------+--------++ LV e' medial:   12.20 cm/s LV PW:        0.78 cm  +----------------+----------++ +--------------+--------++ LV E/e' medial: 7.4        LV IVS:       0.66 cm  +----------------+----------++ +--------------+--------++ LVOT diam:    1.90 cm  +--------------+--------++ LV SV:        41 ml    +--------------+--------++ LV SV Index:  27.31    +--------------+--------++ LVOT Area:    2.84 cm +--------------+--------++                        +--------------+--------++  +---------------+----------++ RIGHT VENTRICLE           +---------------+----------++ RV S prime:    13.80 cm/s +---------------+----------++ TAPSE (M-mode):2.3 cm     +---------------+----------++  +---------------+-------++-----------++ LEFT ATRIUM           Index       +---------------+-------++-----------++ LA diam:       2.70 cm1.76 cm/m  +---------------+-------++-----------++ LA Vol (A2C):  29.0 ml18.90 ml/m +---------------+-------++-----------++ LA Vol (A4C):  32.4 ml21.12 ml/m +---------------+-------++-----------++ LA Biplane Vol:33.7 ml21.96 ml/m +---------------+-------++-----------++ +------------+---------++-----------++ RIGHT ATRIUM         Index       +------------+---------++-----------++ RA Pressure:3.00 mmHg            +------------+---------++-----------++ RA Area:    9.19 cm             +------------+---------++-----------++ RA Volume:  17.30 ml 11.28  ml/m +------------+---------++-----------++  +-------------+-------++ AORTA                +-------------+-------++ Ao Root diam:2.30 cm +-------------+-------++  +--------------+----------++ +---------------+---------++ MITRAL VALVE             TRICUSPID VALVE          +--------------+----------++ +---------------+---------++ MV Area (PHT):5.75 cm   Estimated RAP: 3.00 mmHg +--------------+----------++ +---------------+---------++ MV PHT:       38.28 msec +--------------+----------++ +--------------+-------+ MV Decel Time:132 msec   SHUNTS                +--------------+----------++ +--------------+-------+ +--------------+----------++ Systemic Diam:1.90 cm MV E velocity:90.30 cm/s +--------------+-------+ +--------------+----------++ MV A velocity:77.70 cm/s +--------------+----------++ MV E/A ratio: 1.16       +--------------+----------++   Vina Gull MD Electronically signed by Vina Gull MD Signature Date/Time: 05/12/2019/4:04:12 PM    Final          ______________________________________________________________________________________________     Recent Labs: 07/16/2023: TSH  1.610 12/09/2023: ALT 15; Hemoglobin 12.1; Magnesium  1.9; Platelets 244 12/25/2023: BUN 8; Creatinine, Ser 0.73; Potassium 4.8; Sodium 140  Recent Lipid Panel    Component Value Date/Time   CHOL 240 (H) 01/23/2022 0932   TRIG 35 01/23/2022 0932   HDL 84 01/23/2022 0932   CHOLHDL 2.9 01/23/2022 0932   LDLCALC 151 (H) 01/23/2022 0932    History of Present Illness    43 year old female with the above past medical history including palpitations, PACs and PVCs, atypical chest pain, hyperlipidemia, mild PAD, anxiety, and snoring.   Previous monitor in October 2011 showed no significant arrhythmia.  CardioNet in 2013 revealed rhythm, sinus tach since.  Repeat 2016 showed sinus with PACs.  Holter monitor in 2019 showed sinus bradycardia,  normal sinus rhythm, sinus tachycardia, occasional PACs and rare PVCs.  Echocardiogram in August 2020 showed normal LV systolic function.  ABIs in June 2022 showed mild PAD bilaterally.  She underwent home sleep study in 12/2022 which was nondiagnostic due to lack of adequate sleep time.  Lab sleep study was recommended, but not completed.  She was seen in the ED on 12/08/2023 in the setting of tachycardia, workup was unremarkable overall though she was positive for COVID-19, potassium was 3.4. She contacted our office on 12/22/2023 with complaints of increased palpitations following COVID-19 infection.  She was last seen in the office on 01/25/2024 and was stable from a cardiac standpoint.  She did report intermittent palpitations, overall improved.  She contacted our office on 03/09/2024 with concern for an episode of palpitations that lasted only a few seconds.   She presents today for follow-up. Since her last visit she has  1. Palpitations: Prior monitor showed sinus rhythm with PACs, PVCs.  Recent increase in palpitations following COVID-19 infection, increased stress and anxiety.  She declined cardiac monitor.  She notes rare fleeting palpitations, 1 episode per week, denies any associated symptoms.  Continue to monitor symptoms.  No indication for monitor at this time.  Encouraged adequate hydration, stress management.  Continue metoprolol .   2. Atypical chest pain: Echo in August 2020 showed normal LV systolic function. Stable with no anginal symptoms. No indication for ischemic evaluation.    3. Hyperlipidemia: LDL was 125 in 10/2022. Declines statin therapy.    4. History of snoring: She underwent home sleep study in 12/2022 which was nondiagnostic due to lack of adequate sleep time.  In-lab sleep study was recommended, but not completed.  We will reach out to our sleep medicine team to see if she can be scheduled for in lab study.   5. Mild PAD: ABIs in June 2022 showed mild PAD bilaterally. Denies  claudication. No indication for repeat study at this time.    6. Disposition: Follow-up i  Home Medications    Current Outpatient Medications  Medication Sig Dispense Refill   Acetaminophen  (TYLENOL  PO) Take by mouth.     benzonatate  (TESSALON ) 100 MG capsule Take 1 capsule (100 mg total) by mouth 3 (three) times daily as needed for cough. Do not take with alcohol or while operating or driving heavy machinery 21 capsule 0   clonazePAM (KLONOPIN) 0.5 MG tablet Take 0.5 mg by mouth daily as needed.     metoprolol  succinate (TOPROL -XL) 50 MG 24 hr tablet TAKE 2 TABLETS BY MOUTH EVERY DAY WITH OR IMMEDIATELY FOLLOWING A MEAL 180 tablet 3   PARAGARD  INTRAUTERINE COPPER  IU by Intrauterine route.     Vitamin D , Ergocalciferol , (DRISDOL) 1.25 MG (50000 UNIT) CAPS capsule  Take by mouth.     Current Facility-Administered Medications  Medication Dose Route Frequency Provider Last Rate Last Admin   paragard  intrauterine copper  IUD 1 each  1 each Intrauterine Once          Review of Systems    ***.  All other systems reviewed and are otherwise negative except as noted above.    Physical Exam    VS:  There were no vitals taken for this visit. , BMI There is no height or weight on file to calculate BMI.     GEN: Well nourished, well developed, in no acute distress. HEENT: normal. Neck: Supple, no JVD, carotid bruits, or masses. Cardiac: RRR, no murmurs, rubs, or gallops. No clubbing, cyanosis, edema.  Radials/DP/PT 2+ and equal bilaterally.  Respiratory:  Respirations regular and unlabored, clear to auscultation bilaterally. GI: Soft, nontender, nondistended, BS + x 4. MS: no deformity or atrophy. Skin: warm and dry, no rash. Neuro:  Strength and sensation are intact. Psych: Normal affect.  Accessory Clinical Findings    ECG personally reviewed by me today -    - no acute changes.   Lab Results  Component Value Date   WBC 4.0 12/09/2023   HGB 12.1 12/09/2023   HCT 37.5 12/09/2023    MCV 99.2 12/09/2023   PLT 244 12/09/2023   Lab Results  Component Value Date   CREATININE 0.73 12/25/2023   BUN 8 12/25/2023   NA 140 12/25/2023   K 4.8 12/25/2023   CL 106 12/25/2023   CO2 22 12/25/2023   Lab Results  Component Value Date   ALT 15 12/09/2023   AST 18 12/09/2023   ALKPHOS 31 (L) 12/09/2023   BILITOT 0.4 12/09/2023   Lab Results  Component Value Date   CHOL 240 (H) 01/23/2022   HDL 84 01/23/2022   LDLCALC 151 (H) 01/23/2022   TRIG 35 01/23/2022   CHOLHDL 2.9 01/23/2022    No results found for: HGBA1C  Assessment & Plan    1.  ***  No BP recorded.  {Refresh Note OR Click here to enter BP  :1}***   Damien JAYSON Braver, NP 03/15/2024, 3:19 PM

## 2024-03-16 ENCOUNTER — Ambulatory Visit: Admitting: Nurse Practitioner

## 2024-03-22 ENCOUNTER — Ambulatory Visit
Admission: EM | Admit: 2024-03-22 | Discharge: 2024-03-22 | Disposition: A | Discharge status: Home / Self Care | Source: Ambulatory Visit | Attending: Nurse Practitioner | Admitting: Nurse Practitioner

## 2024-03-22 DIAGNOSIS — Z8639 Personal history of other endocrine, nutritional and metabolic disease: Secondary | ICD-10-CM | POA: Diagnosis not present

## 2024-03-22 DIAGNOSIS — R202 Paresthesia of skin: Secondary | ICD-10-CM | POA: Diagnosis not present

## 2024-03-22 NOTE — Discharge Instructions (Addendum)
 Your lab results are pending.  You will be contacted if your pending test results are abnormal. Continue taking your current medications. You may take over-the-counter Tylenol  or ibuprofen  as needed for pain or discomfort. Make sure you are eating foods that are high in potassium such as fruits and vegetables. Go to the emergency department immediately if you experience facial weakness, dizziness, lightheadedness, chest pain, palpitations, or other concerns. If symptoms fail to improve, would like for you to follow-up with your primary care physician for further evaluation. Follow-up as needed.

## 2024-03-22 NOTE — ED Provider Notes (Signed)
 [facility]    CSN: [id number] Arrival date & time: 03/22/24  1406      History   Chief Complaint No chief complaint on file.   HPI Paula Riley is a 43 y.o. female.   The history is provided by the patient.   Patient presents with a 3-day history of a tingling sensation that runs down the right side of her face.  She states that she is experiencing at least 2-3 episodes per day at this time.  She denies other symptoms such as injury, trauma, fever, chills, headache, ear pain, dizziness, visual changes, facial weakness, chest pain, shortness of breath, palpitations, or difficulty breathing..  Patient states that she currently takes metoprolol  to control her heart rate.  She also endorses an underlying history of anxiety.  Per review of the chart, patient was seen earlier this year and her potassium level was low during a prior ER visit.  She states that she did follow-up with cardiology and they repeated the blood work and her levels were normal at that time.  Patient is followed by cardiology.  Past Medical History:  Diagnosis Date   Anxiety    GERD (gastroesophageal reflux disease)    Heart murmur    History of Holter monitoring 06/2010 and 11/2011   Cardionet montior with sinus tachycardia, PAC's only, no arrhythmias either monitor   Hx of echocardiogram 06/2010   normal   Mental disorder    Palpitations    Pregnancy induced hypertension     Patient Active Problem List   Diagnosis Date Noted   IUD threads lost 10/20/2023   IUD check up 10/20/2023   Menorrhagia with irregular cycle 10/20/2023   Encounter for IUD removal and reinsertion 08/17/2023   Malpositioned intrauterine device 08/17/2023   Routine Papanicolaou smear 07/16/2023   Pregnancy examination or test, negative result 07/16/2023   Irregular intermenstrual bleeding 02/25/2021   History of abnormal cervical Pap smear 09/03/2020   Encounter for gynecological examination with Papanicolaou smear of  cervix 09/03/2020   Anxiety 09/03/2020   IUD (intrauterine device) in place 03/29/2020   Anemia 12/15/2019   COVID-19 09/19/2019   LGSIL on Pap smear of cervix 09/19/2019   Breast cyst, right 10/15/2017   Anxiety about health 05/03/2015   Sinus tachycardia 08/28/2014   Palpitations 12/02/2011   GERD (gastroesophageal reflux disease) 11/13/2011   Abdominal pain, other specified site 11/13/2011    Past Surgical History:  Procedure Laterality Date   DENTAL SURGERY     INDUCED ABORTION      OB History     Gravida  4   Para  3   Term  3   Preterm      AB  1   Living  3      SAB      IAB  1   Ectopic      Multiple  0   Live Births  3            Home Medications    Prior to Admission medications   Medication Sig Start Date End Date Taking? Authorizing Provider  Acetaminophen  (TYLENOL  PO) Take by mouth.    [provider]  benzonatate  (TESSALON ) 100 MG capsule Take 1 capsule (100 mg total) by mouth 3 (three) times daily as needed for cough. Do not take with alcohol or while operating or driving heavy machinery 6/74/74   Chandra Harlene LABOR, NP  clonazePAM (KLONOPIN) 0.5 MG tablet Take 0.5 mg by mouth daily as  needed. 10/16/22   [provider]  metoprolol  succinate (TOPROL -XL) 50 MG 24 hr tablet TAKE 2 TABLETS BY MOUTH EVERY DAY WITH OR IMMEDIATELY FOLLOWING A MEAL 01/11/24   Daneen Perkins C, NP  PARAGARD  INTRAUTERINE COPPER  IU by Intrauterine route.    [provider]  Vitamin D , Ergocalciferol , (DRISDOL) 1.25 MG (50000 UNIT) CAPS capsule Take by mouth. 08/23/20   [provider]    Family History Family History  Problem Relation Age of Onset   Hypertension Other    CAD Other    Diabetes Maternal Grandmother    Breast cancer Maternal Grandmother    Healthy Son    Healthy Sister        x 6   Healthy Sister        x 2   Healthy Son     Social History Social History   Tobacco Use   Smoking status: Never    Smokeless tobacco: Never  Vaping Use   Vaping status: Never Used  Substance Use Topics   Alcohol use: No    Alcohol/week: 0.0 standard drinks of alcohol   Drug use: No     Allergies   Patient has no known allergies.   Review of Systems Review of Systems Per HPI  Physical Exam Triage Vital Signs ED Triage Vitals  Encounter Vitals Group     BP 03/22/24 1419 133/79     Girls Systolic BP Percentile --      Girls Diastolic BP Percentile --      Boys Systolic BP Percentile --      Boys Diastolic BP Percentile --      Pulse Rate 03/22/24 1419 (!) 105     Resp 03/22/24 1419 18     Temp 03/22/24 1419 97.8 F (36.6 C)     Temp Source 03/22/24 1419 Oral     SpO2 03/22/24 1419 100 %     Weight --      Height --      Head Circumference --      Peak Flow --      Pain Score 03/22/24 1421 0     Pain Loc --      Pain Education --      Exclude from Growth Chart --    No data found.  Updated Vital Signs BP 133/79 (BP Location: Right Arm)   Pulse (!) 105   Temp 97.8 F (36.6 C) (Oral)   Resp 18   SpO2 100%   Visual Acuity Right Eye Distance:   Left Eye Distance:   Bilateral Distance:    Right Eye Near:   Left Eye Near:    Bilateral Near:     Physical Exam Vitals and nursing note reviewed.  Constitutional:      General: She is not in acute distress.    Appearance: Normal appearance.  HENT:     Head: Normocephalic.     Right Ear: Tympanic membrane, ear canal and external ear normal.     Left Ear: Tympanic membrane, ear canal and external ear normal.     Nose: Nose normal.     Mouth/Throat:     Mouth: Mucous membranes are moist.  Eyes:     Extraocular Movements: Extraocular movements intact.     Conjunctiva/sclera: Conjunctivae normal.     Pupils: Pupils are equal, round, and reactive to light.  Cardiovascular:     Rate and Rhythm: Regular rhythm. Tachycardia present.     Pulses: Normal pulses.  Heart sounds: Normal heart sounds.  Pulmonary:     Effort:  Pulmonary effort is normal. No respiratory distress.     Breath sounds: Normal breath sounds. No stridor. No wheezing, rhonchi or rales.  Abdominal:     General: Bowel sounds are normal.     Palpations: Abdomen is soft.     Tenderness: There is no abdominal tenderness.  Musculoskeletal:     Cervical back: Normal range of motion.  Skin:    General: Skin is warm and dry.  Neurological:     General: No focal deficit present.     Mental Status: She is alert and oriented to person, place, and time.  Psychiatric:        Mood and Affect: Mood normal.        Behavior: Behavior normal.      UC Treatments / Results  Labs (all labs ordered are listed, but only abnormal results are displayed) Labs Reviewed - No data to display  EKG   Radiology No results found.  Procedures Procedures (including critical care time)  Medications Ordered in UC Medications - No data to display  Initial Impression / Assessment and Plan / UC Course  I have reviewed the triage vital signs and the nursing notes.  Pertinent labs & imaging results that were available during my care of the patient were reviewed by me and considered in my medical decision making (see chart for details).  Patient presents for a tingling sensation to the right side of her face has been present for the past few days.  Exam was within normal limits, neurological exam is within normal limits.  Patient does report prior history of hypokalemia.  Will repeat BMP to check potassium level.  Supportive care recommendations were provided discussed with the patient to include over-the-counter analgesics, dietary changes to increase her potassium level.  Patient was given strict ER follow-up precautions.  Patient advised to follow-up with her PCP if symptoms fail to improve with this treatment.  Patient was in agreement with this plan of care and verbalizes understanding.  All questions were answered.  Patient stable for discharge.  Final  Clinical Impressions(s) / UC Diagnoses   Final diagnoses:  None   Discharge Instructions   None    ED Prescriptions   None    PDMP not reviewed this encounter.   Gilmer Etta PARAS, NP 03/22/24 1453

## 2024-03-22 NOTE — ED Triage Notes (Signed)
 Pt reports chill x 4 days

## 2024-03-23 LAB — BASIC METABOLIC PANEL WITH GFR
BUN/Creatinine Ratio: 20 (ref 9–23)
BUN: 13 mg/dL (ref 6–24)
CO2: 21 mmol/L (ref 20–29)
Calcium: 9.2 mg/dL (ref 8.7–10.2)
Chloride: 101 mmol/L (ref 96–106)
Creatinine, Ser: 0.64 mg/dL (ref 0.57–1.00)
Glucose: 82 mg/dL (ref 70–99)
Potassium: 4.7 mmol/L (ref 3.5–5.2)
Sodium: 138 mmol/L (ref 134–144)
eGFR: 113 mL/min/1.73 (ref >59)

## 2024-03-24 ENCOUNTER — Ambulatory Visit (HOSPITAL_COMMUNITY): Payer: Self-pay

## 2024-03-30 ENCOUNTER — Ambulatory Visit: Admitting: Adult Health

## 2024-04-14 ENCOUNTER — Ambulatory Visit: Admitting: Adult Health

## 2024-04-14 ENCOUNTER — Encounter: Payer: Self-pay | Admitting: Adult Health

## 2024-04-14 ENCOUNTER — Other Ambulatory Visit (HOSPITAL_COMMUNITY)
Admission: RE | Admit: 2024-04-14 | Discharge: 2024-04-14 | Disposition: A | Discharge status: Home / Self Care | Source: Ambulatory Visit | Attending: Adult Health | Admitting: Adult Health

## 2024-04-14 VITALS — BP 116/78 | HR 92 | Ht 65.0 in | Wt 120.5 lb

## 2024-04-14 DIAGNOSIS — N926 Irregular menstruation, unspecified: Secondary | ICD-10-CM | POA: Diagnosis present

## 2024-04-14 DIAGNOSIS — T8332XD Displacement of intrauterine contraceptive device, subsequent encounter: Secondary | ICD-10-CM

## 2024-04-14 DIAGNOSIS — Z113 Encounter for screening for infections with a predominantly sexual mode of transmission: Secondary | ICD-10-CM | POA: Insufficient documentation

## 2024-04-14 DIAGNOSIS — Z3200 Encounter for pregnancy test, result unknown: Secondary | ICD-10-CM | POA: Diagnosis not present

## 2024-04-14 DIAGNOSIS — Z3202 Encounter for pregnancy test, result negative: Secondary | ICD-10-CM | POA: Diagnosis not present

## 2024-04-14 LAB — POCT URINE PREGNANCY: Preg Test, Ur: NEGATIVE

## 2024-04-14 NOTE — Progress Notes (Signed)
 Subjective:     Patient ID: Paula Riley, female   DOB: [birth year], 43 y.o.   MRN: [id number]  HPI Paula Riley is a 43 year old black female,with DP, [id number], in complaining of irregular bleeding with [name] IUD. She had period June 4 and bleeding again June 28 for 2 weeks, that second week had more mucous, and then period July 25. No cramping.     Component Value Date/Time   DIAGPAP  07/16/2023 0937    - Negative for Intraepithelial Lesions or Malignancy (NILM)   DIAGPAP - Benign reactive/reparative changes 07/16/2023 0937   DIAGPAP  09/03/2020 1428    - Negative for intraepithelial lesion or malignancy (NILM)   HPVHIGH Negative 07/16/2023 0937   HPVHIGH Negative 09/03/2020 1428   HPVHIGH Negative 09/01/2019 1447   ADEQPAP  07/16/2023 0937    Satisfactory for evaluation; transformation zone component PRESENT.   ADEQPAP  09/03/2020 1428    Satisfactory for evaluation; transformation zone component ABSENT.   ADEQPAP  09/01/2019 1447    Satisfactory for evaluation; transformation zone component PRESENT.   PCP is GORMAN Mace PA Review of Systems +irregular bleeding with [name] IUD. She had period June 4 and bleeding again June 28 for 2 weeks, that second week had more mucous, and then period July 25.  No cramping.   Reviewed past medical,surgical, social and family history. Reviewed medications and allergies.  Objective:   Physical Exam BP 116/78 (BP Location: Left Arm, Patient Position: Sitting, Cuff Size: Normal)   Pulse 92   Ht 5' 5 (1.651 m)   Wt 120 lb 8 oz (54.7 kg)   LMP 04/08/2024 (Exact Date)   BMI 20.05 kg/m  UPT is negative Skin warm and dry.Pelvic: external genitalia is normal in appearance no lesions, vagina: redish tan discharge,urethra has no lesions or masses noted, cervix:smooth and bulbous, no IUD strings seen, uterus: normal size, shape and contour, non tender, no masses felt, adnexa: no masses or tenderness noted. Bladder is non tender and no masses felt. CV  swab obtained.     Upstream - 04/14/24 0925       Pregnancy Intention Screening   Does the patient want to become pregnant in the next year? No    Does the patient's partner want to become pregnant in the next year? No    Would the patient like to discuss contraceptive options today? No      Contraception Wrap Up   Current Method IUD or IUS    End Method IUD or IUS    Contraception Counseling Provided Yes         Examination chaperoned by Clarita Salt LPN   Assessment:     1. Negative pregnancy test - POCT urine pregnancy  2. Irregular bleeding(Primary)  irregular bleeding with [name] IUD. She had period June 4 and bleeding again June 28 for 2 weeks, that second week had more mucous, and then period July 25 Will get US  04/22/24 at 9:30 am at [facility] to assess uterus and ovaries and IUD placement  - Cervicovaginal ancillary only( [place]) - US  PELVIC COMPLETE WITH TRANSVAGINAL; Future  3. Intrauterine contraceptive device threads lost, subsequent encounter No strings seen Will get US  to assess IUD placement  - US  PELVIC COMPLETE WITH TRANSVAGINAL; Future  4. Screening examination for STD (sexually transmitted disease) CV swab sent for GC/CHL,trich,BV and yeast  - Cervicovaginal ancillary only( [place])     Plan:    Call her with US  results, she  does not use MyCHART  Follow up  TBD

## 2024-04-15 LAB — CERVICOVAGINAL ANCILLARY ONLY
Bacterial Vaginitis (gardnerella): POSITIVE — AB
Candida Glabrata: NEGATIVE
Candida Vaginitis: NEGATIVE
Chlamydia: NEGATIVE
Comment: NEGATIVE
Comment: NEGATIVE
Comment: NEGATIVE
Comment: NEGATIVE
Comment: NEGATIVE
Comment: NORMAL
Neisseria Gonorrhea: NEGATIVE
Trichomonas: NEGATIVE

## 2024-04-18 ENCOUNTER — Ambulatory Visit: Payer: Self-pay | Admitting: Adult Health

## 2024-04-18 MED ORDER — METRONIDAZOLE 500 MG PO TABS
500.0000 mg | ORAL_TABLET | Freq: Two times a day (BID) | ORAL | 0 refills | Status: DC
Start: 2024-04-18 — End: 2024-06-14

## 2024-04-19 ENCOUNTER — Other Ambulatory Visit: Payer: Self-pay

## 2024-04-19 ENCOUNTER — Encounter: Payer: Self-pay | Admitting: Emergency Medicine

## 2024-04-19 ENCOUNTER — Ambulatory Visit: Admission: EM | Admit: 2024-04-19 | Discharge: 2024-04-19 | Disposition: A | Discharge status: Home / Self Care

## 2024-04-19 DIAGNOSIS — S8012XA Contusion of left lower leg, initial encounter: Secondary | ICD-10-CM

## 2024-04-19 NOTE — ED Triage Notes (Signed)
 Pt reports bruise to left leg since getting off work today. Pt denies any known injury. Gait steady.

## 2024-04-19 NOTE — ED Provider Notes (Signed)
 [facility]    CSN: [id number] Arrival date & time: 04/19/24  1715      History   Chief Complaint Chief Complaint  Patient presents with   Skin Problem    HPI Paula Riley is a 43 y.o. female.   Pt reports bruise to left leg since getting off work today. Pt denies any known injury. Gait steady.   Patient presenting today with a new slightly tender bruise to the posterior left lower leg that she first noticed when she got off work today.  She denies known injury to the area, pain with walking, numbness, tingling, edema, chest pain, shortness of breath, palpitations.  So far not trying anything over-the-counter for symptoms.    Past Medical History:  Diagnosis Date   Anxiety    GERD (gastroesophageal reflux disease)    Heart murmur    History of Holter monitoring 06/2010 and 11/2011   Cardionet montior with sinus tachycardia, PAC's only, no arrhythmias either monitor   Hx of echocardiogram 06/2010   normal   Mental disorder    Palpitations    Pregnancy induced hypertension     Patient Active Problem List   Diagnosis Date Noted   Screening examination for STD (sexually transmitted disease) 04/14/2024   Irregular bleeding 04/14/2024   Negative pregnancy test 04/14/2024   IUD threads lost 10/20/2023   IUD check up 10/20/2023   Menorrhagia with irregular cycle 10/20/2023   Encounter for IUD removal and reinsertion 08/17/2023   Malpositioned intrauterine device 08/17/2023   Routine Papanicolaou smear 07/16/2023   Pregnancy examination or test, negative result 07/16/2023   Irregular intermenstrual bleeding 02/25/2021   History of abnormal cervical Pap smear 09/03/2020   Encounter for gynecological examination with Papanicolaou smear of cervix 09/03/2020   Anxiety 09/03/2020   IUD (intrauterine device) in place 03/29/2020   Anemia 12/15/2019   COVID-19 09/19/2019   LGSIL on Pap smear of cervix 09/19/2019   Breast cyst, right 10/15/2017   Anxiety about  health 05/03/2015   Sinus tachycardia 08/28/2014   Palpitations 12/02/2011   GERD (gastroesophageal reflux disease) 11/13/2011   Abdominal pain, other specified site 11/13/2011    Past Surgical History:  Procedure Laterality Date   DENTAL SURGERY     INDUCED ABORTION      OB History     Gravida  4   Para  3   Term  3   Preterm      AB  1   Living  3      SAB      IAB  1   Ectopic      Multiple  0   Live Births  3            Home Medications    Prior to Admission medications   Medication Sig Start Date End Date Taking? Authorizing Provider  Acetaminophen  (TYLENOL  PO) Take by mouth.    [provider]  clonazePAM (KLONOPIN) 0.5 MG tablet Take 0.5 mg by mouth daily as needed. 10/16/22   [provider]  metoprolol  succinate (TOPROL -XL) 50 MG 24 hr tablet TAKE 2 TABLETS BY MOUTH EVERY DAY WITH OR IMMEDIATELY FOLLOWING A MEAL 01/11/24   Monge, Damien BROCKS, NP  metroNIDAZOLE  (FLAGYL ) 500 MG tablet Take 1 tablet (500 mg total) by mouth 2 (two) times daily. 04/18/24   Signa Delon LABOR, NP  PARAGARD  INTRAUTERINE COPPER  IU by Intrauterine route.    [provider]    Family History Family History  Problem  Relation Age of Onset   Hypertension Other    CAD Other    Diabetes Maternal Grandmother    Breast cancer Maternal Grandmother    Healthy Son    Healthy Sister        x 6   Healthy Sister        x 2   Healthy Son     Social History Social History   Tobacco Use   Smoking status: Never   Smokeless tobacco: Never  Vaping Use   Vaping status: Never Used  Substance Use Topics   Alcohol use: No    Alcohol/week: 0.0 standard drinks of alcohol   Drug use: No     Allergies   Patient has no known allergies.   Review of Systems Review of Systems Per HPI  Physical Exam Triage Vital Signs ED Triage Vitals  Encounter Vitals Group     BP 04/19/24 1759 127/77     Girls Systolic BP Percentile --      Girls Diastolic BP  Percentile --      Boys Systolic BP Percentile --      Boys Diastolic BP Percentile --      Pulse Rate 04/19/24 1759 (!) 110     Resp 04/19/24 1759 20     Temp 04/19/24 1759 98 F (36.7 C)     Temp Source 04/19/24 1759 Oral     SpO2 04/19/24 1759 100 %     Weight --      Height --      Head Circumference --      Peak Flow --      Pain Score 04/19/24 1801 0     Pain Loc --      Pain Education --      Exclude from Growth Chart --    No data found.  Updated Vital Signs BP 127/77 (BP Location: Right Arm)   Pulse (!) 110   Temp 98 F (36.7 C) (Oral)   Resp 20   LMP 04/08/2024 (Exact Date)   SpO2 100%   Visual Acuity Right Eye Distance:   Left Eye Distance:   Bilateral Distance:    Right Eye Near:   Left Eye Near:    Bilateral Near:     Physical Exam Vitals and nursing note reviewed.  Constitutional:      Appearance: Normal appearance. She is not ill-appearing.  HENT:     Head: Atraumatic.  Eyes:     Extraocular Movements: Extraocular movements intact.     Conjunctiva/sclera: Conjunctivae normal.  Cardiovascular:     Rate and Rhythm: Normal rate and regular rhythm.     Heart sounds: Normal heart sounds.  Pulmonary:     Effort: Pulmonary effort is normal.     Breath sounds: Normal breath sounds.  Musculoskeletal:        General: Normal range of motion.     Cervical back: Normal range of motion and neck supple.     Comments: No left leg edema, minimally tender to palpation just over the bruised area, negative Homans' sign and squeeze test left lower extremity  Skin:    General: Skin is warm and dry.     Findings: Bruising present.     Comments: Mild contusion present to the left mid calf region in an oblong shape, very mildly tender to palpation.  Nonedematous, no palpable masses  Neurological:     Mental Status: She is alert and oriented to person, place, and time.  Comments: Left lower extremity neurovascular intact  Psychiatric:        Mood and Affect:  Mood normal.        Thought Content: Thought content normal.        Judgment: Judgment normal.      UC Treatments / Results  Labs (all labs ordered are listed, but only abnormal results are displayed) Labs Reviewed - No data to display  EKG   Radiology No results found.  Procedures Procedures (including critical care time)  Medications Ordered in UC Medications - No data to display  Initial Impression / Assessment and Plan / UC Course  I have reviewed the triage vital signs and the nursing notes.  Pertinent labs & imaging results that were available during my care of the patient were reviewed by me and considered in my medical decision making (see chart for details).     Consistent with contusion, treat with RICE protocol, over-the-counter pain relievers.  Return for worsening symptoms.  Very low suspicion for DVT, ultrasound deferred with shared decision making.  Final Clinical Impressions(s) / UC Diagnoses   Final diagnoses:  Contusion of left lower extremity, initial encounter   Discharge Instructions   None    ED Prescriptions   None    PDMP not reviewed this encounter.   Stuart Vernell Norris, [place] 04/19/24 1850

## 2024-04-22 ENCOUNTER — Ambulatory Visit (HOSPITAL_COMMUNITY)
Admission: RE | Admit: 2024-04-22 | Discharge: 2024-04-22 | Disposition: A | Discharge status: Home / Self Care | Source: Ambulatory Visit | Attending: Adult Health | Admitting: Adult Health

## 2024-04-22 DIAGNOSIS — N926 Irregular menstruation, unspecified: Secondary | ICD-10-CM | POA: Insufficient documentation

## 2024-04-22 DIAGNOSIS — T8332XD Displacement of intrauterine contraceptive device, subsequent encounter: Secondary | ICD-10-CM | POA: Insufficient documentation

## 2024-04-28 ENCOUNTER — Telehealth: Payer: Self-pay | Admitting: Adult Health

## 2024-04-28 NOTE — Telephone Encounter (Signed)
 Pt aware that US  was normal and IUD in place

## 2024-04-28 NOTE — Telephone Encounter (Signed)
 Patient calling concerning some results. Please advise.

## 2024-04-28 NOTE — Telephone Encounter (Signed)
 Pt don't look at her Mychart. Pt aware of +BV. Med sent to pharmacy. No alcohol or sex while on med. Pt voiced understanding. Pt would like results of US . Thanks! JSY

## 2024-06-14 ENCOUNTER — Encounter: Payer: Self-pay | Admitting: Nurse Practitioner

## 2024-06-14 ENCOUNTER — Ambulatory Visit: Attending: Nurse Practitioner | Admitting: Nurse Practitioner

## 2024-06-14 ENCOUNTER — Ambulatory Visit (INDEPENDENT_AMBULATORY_CARE_PROVIDER_SITE_OTHER)

## 2024-06-14 ENCOUNTER — Telehealth: Payer: Self-pay

## 2024-06-14 VITALS — BP 132/70 | HR 104 | Ht 65.0 in | Wt 120.4 lb

## 2024-06-14 DIAGNOSIS — I739 Peripheral vascular disease, unspecified: Secondary | ICD-10-CM | POA: Diagnosis present

## 2024-06-14 DIAGNOSIS — Z87898 Personal history of other specified conditions: Secondary | ICD-10-CM

## 2024-06-14 DIAGNOSIS — R002 Palpitations: Secondary | ICD-10-CM

## 2024-06-14 DIAGNOSIS — G4733 Obstructive sleep apnea (adult) (pediatric): Secondary | ICD-10-CM

## 2024-06-14 DIAGNOSIS — E785 Hyperlipidemia, unspecified: Secondary | ICD-10-CM | POA: Diagnosis not present

## 2024-06-14 DIAGNOSIS — R0789 Other chest pain: Secondary | ICD-10-CM | POA: Diagnosis not present

## 2024-06-14 DIAGNOSIS — G4719 Other hypersomnia: Secondary | ICD-10-CM

## 2024-06-14 DIAGNOSIS — Z862 Personal history of diseases of the blood and blood-forming organs and certain disorders involving the immune mechanism: Secondary | ICD-10-CM | POA: Insufficient documentation

## 2024-06-14 NOTE — Telephone Encounter (Signed)
**Note De-Identified Alaster Asfaw Obfuscation** I started a NPSG PA through the [facility] provider portal and it is currently pending review. Case Number: [id number]

## 2024-06-14 NOTE — Progress Notes (Unsigned)
Enrolled patient for a 7 day Zio XT monitor to be mailed to patients home   [place] to read 

## 2024-06-14 NOTE — Patient Instructions (Signed)
 Medication Instructions:  Your physician recommends that you continue on your current medications as directed. Please refer to the Current Medication list given to you today.  *If you need a refill on your cardiac medications before your next appointment, please call your pharmacy*  Lab Work: CBC, BMET, Magnesium , Iron  today  Testing/Procedures: ZIO XT- Long Term Monitor Instructions  Your physician has requested you wear a ZIO patch monitor for 7 days.  This is a single patch monitor. [employer] supplies one patch monitor per enrollment. Additional stickers are not available. Please do not apply patch if you will be having a Nuclear Stress Test,  Echocardiogram, Cardiac CT, MRI, or Chest Xray during the period you would be wearing the  monitor. The patch cannot be worn during these tests. You cannot remove and re-apply the  ZIO XT patch monitor.  Your ZIO patch monitor will be mailed 3 day USPS to your address on file. It may take 3-5 days  to receive your monitor after you have been enrolled.  Once you have received your monitor, please review the enclosed instructions. Your monitor  has already been registered assigning a specific monitor serial # to you.  Billing and Patient Assistance Program Information  We have supplied [employer] with any of your insurance information on file for billing purposes. [employer] offers a sliding scale Patient Assistance Program for patients that do not have  insurance, or whose insurance does not completely cover the cost of the ZIO monitor.  You must apply for the Patient Assistance Program to qualify for this discounted rate.  To apply, please call [employer] at [phone], select option 4, select option 2, ask to apply for  Patient Assistance Program. Meredeth will ask your household income, and how many people  are in your household. They will quote your out-of-pocket cost based on that information.  [employer] will also be able to set up a 63-month,  interest-free payment plan if needed.  Applying the monitor   Shave hair from upper left chest.  Hold abrader disc by orange tab. Rub abrader in 40 strokes over the upper left chest as  indicated in your monitor instructions.  Clean area with 4 enclosed alcohol pads. Let dry.  Apply patch as indicated in monitor instructions. Patch will be placed under collarbone on left  side of chest with arrow pointing upward.  Rub patch adhesive wings for 2 minutes. Remove white label marked 1. Remove the white  label marked 2. Rub patch adhesive wings for 2 additional minutes.  While looking in a mirror, press and release button in center of patch. A small green light will  flash 3-4 times. This will be your only indicator that the monitor has been turned on.  Do not shower for the first 24 hours. You may shower after the first 24 hours.  Press the button if you feel a symptom. You will hear a small click. Record Date, Time and  Symptom in the Patient Logbook.  When you are ready to remove the patch, follow instructions on the last 2 pages of Patient  Logbook. Stick patch monitor onto the last page of Patient Logbook.  Place Patient Logbook in the blue and white box. Use locking tab on box and tape box closed  securely. The blue and white box has prepaid postage on it. Please place it in the mailbox as  soon as possible. Your physician should have your test results approximately 7 days after the  monitor has been mailed back to  [employer].  Call [facility] Customer Care at [phone] if you have questions regarding  your ZIO XT patch monitor. Call them immediately if you see an orange light blinking on your  monitor.  If your monitor falls off in less than 4 days, contact our Monitor department at [phone].  If your monitor becomes loose or falls off after 4 days call [employer] at [phone] for  suggestions on securing your monitor   Follow-Up: At [facility], you  and your health needs are our priority.  As part of our continuing mission to provide you with exceptional heart care, our providers are all part of one team.  This team includes your primary Cardiologist (physician) and Advanced Practice Providers or APPs (Physician Assistants and Nurse Practitioners) who all work together to provide you with the care you need, when you need it.  Your next appointment:   4-6 week(s)  Provider:   Redell Shallow, MD    We recommend signing up for the patient portal called MyChart.  Sign up information is provided on this After Visit Summary.  MyChart is used to connect with patients for Virtual Visits (Telemedicine).  Patients are able to view lab/test results, encounter notes, upcoming appointments, etc.  Non-urgent messages can be sent to your provider as well.   To learn more about what you can do with MyChart, go to [email or web address].

## 2024-06-14 NOTE — Progress Notes (Signed)
 Office Visit    Patient Name: Paula Riley Date of Encounter: 06/14/2024  Primary Care Provider:  Leonce Lucie PARAS, PA-C Primary Cardiologist:  Redell Shallow, MD  Chief Complaint    43 year old female with a history of palpitations, PACs and PVCs, atypical chest pain, hyperlipidemia, mild PAD, anxiety, and snoring who presents for follow-up related to palpitations.   Past Medical History    Past Medical History:  Diagnosis Date   Anxiety    GERD (gastroesophageal reflux disease)    Heart murmur    History of Holter monitoring 06/2010 and 11/2011   Cardionet montior with sinus tachycardia, PAC's only, no arrhythmias either monitor   Hx of echocardiogram 06/2010   normal   Mental disorder    Palpitations    Pregnancy induced hypertension    Past Surgical History:  Procedure Laterality Date   DENTAL SURGERY     INDUCED ABORTION      Allergies  No Known Allergies   Labs/Other Studies Reviewed    The following studies were reviewed today:  Cardiac Studies & Procedures   ______________________________________________________________________________________________     ECHOCARDIOGRAM  ECHOCARDIOGRAM COMPLETE 05/12/2019  Narrative ECHOCARDIOGRAM REPORT    Patient Name:   Paula Riley Date of Exam: 05/12/2019 Medical Rec #:  [id number]     Height:       65.0 in Accession #:    [id number]    Weight:       110.0 lb Date of Birth:  May 15, 1981      BSA:          1.53 m Patient Age:    38 years      BP:           120/78 mmHg Patient Gender: F             HR:           81 bpm. Exam Location:  [facility]   Procedure: 2D Echo  Indications:    Palpitations 785.1 / R00.2  History:        Patient has prior history of Echocardiogram examinations, most recent 06/27/2010. Signs/Symptoms: Murmur Risk Factors: Non-Smoker. GERD, HPV, Sinus Tachycardia, Palpitations.  Sonographer:    Orvil Holmes RDCS (AE) Referring Phys: [id number] BRIAN S  CRENSHAW  IMPRESSIONS   1. The left ventricle has normal systolic function, with an ejection fraction of 55-60%. The cavity size was normal. Left ventricular diastolic parameters were normal. 2. The right ventricle has normal systolic function. The cavity was normal. There is no increase in right ventricular wall thickness. 3. The aorta is normal unless otherwise noted. 4. The aortic root is normal in size and structure.  FINDINGS Left Ventricle: The left ventricle has normal systolic function, with an ejection fraction of 55-60%. The cavity size was normal. There is no increase in left ventricular wall thickness. Left ventricular diastolic parameters were normal.  Right Ventricle: The right ventricle has normal systolic function. The cavity was normal. There is no increase in right ventricular wall thickness.  Left Atrium: Left atrial size was normal in size.  Right Atrium: Right atrial size was normal in size. Right atrial pressure is estimated at 10 mmHg.  Interatrial Septum: No atrial level shunt detected by color flow Doppler.  Pericardium: There is no evidence of pericardial effusion.  Mitral Valve: The mitral valve is normal in structure. Mitral valve regurgitation is not visualized by color flow Doppler.  Tricuspid Valve: The tricuspid valve is normal in structure. Tricuspid valve regurgitation is  trivial by color flow Doppler.  Aortic Valve: The aortic valve is normal in structure. Aortic valve regurgitation was not visualized by color flow Doppler.  Pulmonic Valve: The pulmonic valve was not well visualized. Pulmonic valve regurgitation is not visualized by color flow Doppler.  Aorta: The aortic root is normal in size and structure. The aorta is normal unless otherwise noted.  Venous: The inferior vena cava is normal in size with greater than 50% respiratory variability.   +--------------+--------++ LEFT VENTRICLE          +----------------+----------++ +--------------+--------++ Diastology                 PLAX 2D                +----------------+----------++ +--------------+--------++ LV e' lateral:  17.00 cm/s LVIDd:        3.93 cm  +----------------+----------++ +--------------+--------++ LV E/e' lateral:5.3        LVIDs:        2.66 cm  +----------------+----------++ +--------------+--------++ LV e' medial:   12.20 cm/s LV PW:        0.78 cm  +----------------+----------++ +--------------+--------++ LV E/e' medial: 7.4        LV IVS:       0.66 cm  +----------------+----------++ +--------------+--------++ LVOT diam:    1.90 cm  +--------------+--------++ LV SV:        41 ml    +--------------+--------++ LV SV Index:  27.31    +--------------+--------++ LVOT Area:    2.84 cm +--------------+--------++                        +--------------+--------++  +---------------+----------++ RIGHT VENTRICLE           +---------------+----------++ RV S prime:    13.80 cm/s +---------------+----------++ TAPSE (M-mode):2.3 cm     +---------------+----------++  +---------------+-------++-----------++ LEFT ATRIUM           Index       +---------------+-------++-----------++ LA diam:       2.70 cm1.76 cm/m  +---------------+-------++-----------++ LA Vol (A2C):  29.0 ml18.90 ml/m +---------------+-------++-----------++ LA Vol (A4C):  32.4 ml21.12 ml/m +---------------+-------++-----------++ LA Biplane Vol:33.7 ml21.96 ml/m +---------------+-------++-----------++ +------------+---------++-----------++ RIGHT ATRIUM         Index       +------------+---------++-----------++ RA Pressure:3.00 mmHg            +------------+---------++-----------++ RA Area:    9.19 cm             +------------+---------++-----------++ RA Volume:  17.30 ml 11.28  ml/m +------------+---------++-----------++  +-------------+-------++ AORTA                +-------------+-------++ Ao Root diam:2.30 cm +-------------+-------++  +--------------+----------++ +---------------+---------++ MITRAL VALVE             TRICUSPID VALVE          +--------------+----------++ +---------------+---------++ MV Area (PHT):5.75 cm   Estimated RAP: 3.00 mmHg +--------------+----------++ +---------------+---------++ MV PHT:       38.28 msec +--------------+----------++ +--------------+-------+ MV Decel Time:132 msec   SHUNTS                +--------------+----------++ +--------------+-------+ +--------------+----------++ Systemic Diam:1.90 cm MV E velocity:90.30 cm/s +--------------+-------+ +--------------+----------++ MV A velocity:77.70 cm/s +--------------+----------++ MV E/A ratio: 1.16       +--------------+----------++   Vina Gull MD Electronically signed by Vina Gull MD Signature Date/Time: 05/12/2019/4:04:12 PM    Final          ______________________________________________________________________________________________     Recent Labs: 07/16/2023: TSH  1.610 12/09/2023: ALT 15; Hemoglobin 12.1; Magnesium  1.9; Platelets 244 03/22/2024: BUN 13; Creatinine, Ser 0.64; Potassium 4.7; Sodium 138  Recent Lipid Panel    Component Value Date/Time   CHOL 240 (H) 01/23/2022 0932   TRIG 35 01/23/2022 0932   HDL 84 01/23/2022 0932   CHOLHDL 2.9 01/23/2022 0932   LDLCALC 151 (H) 01/23/2022 0932    History of Present Illness    43 year old female with the above past medical history including palpitations, PACs and PVCs, atypical chest pain, hyperlipidemia, mild PAD, anxiety, and snoring.   Previous monitor in October 2011 showed no significant arrhythmia.  CardioNet in 2013 revealed rhythm, sinus tach since.  Repeat 2016 showed sinus with PACs.  Holter monitor in 2019 showed sinus bradycardia,  normal sinus rhythm, sinus tachycardia, occasional PACs and rare PVCs.  Echocardiogram in August 2020 showed normal LV systolic function.  ABIs in June 2022 showed mild PAD bilaterally.  She underwent home sleep study in 12/2022 which was nondiagnostic due to lack of adequate sleep time.  Lab sleep study was recommended, but not completed.  She was seen in the ED on 12/08/2023 in the setting of tachycardia, workup was unremarkable overall though she was positive for COVID-19, potassium was 3.4. She contacted our office on 12/22/2023 with complaints of increased palpitations following COVID-19 infection.  She was last seen in the office on 12/25/2023 and was stable from a cardiac standpoint.  She declined repeat cardiac monitor.  She contacted our office on 03/10/2023 with concern for increased palpitations. Follow-up appointment was recommended.   She presents today for follow-up. Since her last visit she reports daily palpitations despite taking metoprolol  succinate 50 mg daily.  She denies any chest pain, shortness of breath.  She does report increased anxiety, near panic attacks.  She is visibly anxious in office today, trembling, heart rate is elevated.    Home Medications    Current Outpatient Medications  Medication Sig Dispense Refill   Acetaminophen  (TYLENOL  PO) Take by mouth. (Patient taking differently: Take 500 mg by mouth as needed. Pt takes 1,000 mg 2 tablets.)     clonazePAM (KLONOPIN) 0.5 MG tablet Take 0.5 mg by mouth daily as needed.     metoprolol  succinate (TOPROL -XL) 50 MG 24 hr tablet TAKE 2 TABLETS BY MOUTH EVERY DAY WITH OR IMMEDIATELY FOLLOWING A MEAL 180 tablet 3   PARAGARD  INTRAUTERINE COPPER  IU by Intrauterine route.     Current Facility-Administered Medications  Medication Dose Route Frequency Provider Last Rate Last Admin   paragard  intrauterine copper  IUD 1 each  1 each Intrauterine Once          Review of Systems    She denies chest pain, dyspnea, pnd, orthopnea, n, v,  dizziness, syncope, edema, weight gain, or early satiety. All other systems reviewed and are otherwise negative except as noted above.   Physical Exam    VS:  BP 132/70   Pulse (!) 104   Ht 5' 5 (1.651 m)   Wt 120 lb 6.4 oz (54.6 kg)   SpO2 99%   BMI 20.04 kg/m   GEN: Well nourished, well developed, in no acute distress. HEENT: normal. Neck: Supple, no JVD, carotid bruits, or masses. Cardiac: RRR, no murmurs, rubs, or gallops. No clubbing, cyanosis, edema.  Radials/DP/PT 2+ and equal bilaterally.  Respiratory:  Respirations regular and unlabored, clear to auscultation bilaterally. GI: Soft, nontender, nondistended, BS + x 4. MS: no deformity or atrophy. Skin: warm and dry, no rash. Neuro:  Strength  and sensation are intact. Psych: Normal affect.  Accessory Clinical Findings    ECG personally reviewed by me today - EKG Interpretation Date/Time:  Tuesday June 14 2024 09:37:25 EDT Ventricular Rate:  104 PR Interval:  152 QRS Duration:  68 QT Interval:  352 QTC Calculation: 462 R Axis:   61  Text Interpretation: Sinus tachycardia When compared with ECG of 25-Dec-2023 08:11, No significant change was found Confirmed by Daneen Perkins ([id number]) on 06/14/2024 9:42:34 AM  - no acute changes.   Lab Results  Component Value Date   WBC 4.0 12/09/2023   HGB 12.1 12/09/2023   HCT 37.5 12/09/2023   MCV 99.2 12/09/2023   PLT 244 12/09/2023   Lab Results  Component Value Date   CREATININE 0.64 03/22/2024   BUN 13 03/22/2024   NA 138 03/22/2024   K 4.7 03/22/2024   CL 101 03/22/2024   CO2 21 03/22/2024   Lab Results  Component Value Date   ALT 15 12/09/2023   AST 18 12/09/2023   ALKPHOS 31 (L) 12/09/2023   BILITOT 0.4 12/09/2023   Lab Results  Component Value Date   CHOL 240 (H) 01/23/2022   HDL 84 01/23/2022   LDLCALC 151 (H) 01/23/2022   TRIG 35 01/23/2022   CHOLHDL 2.9 01/23/2022    No results found for: HGBA1C  Assessment & Plan    1. Palpitations: Prior  monitor showed sinus rhythm with PACs, PVCs.  She reports daily palpitations despite taking metoprolol  succinate 50 mg daily. She denies any chest pain, shortness of breath.  She does report increased anxiety, near panic attacks.  EKG today shows sinus tachycardia.  Will check CBC, BMET, TSH, magnesium .  Will check 7-day ZIO.  We discussed possibly transitioning from metoprolol  to propranolol, she declines any medication changes today.  Recommend follow-up with PCP for anxiety management. Continue metoprolol .    2. Atypical chest pain: Prior complaints of chest pain.  Echo in August 2020 showed normal LV systolic function.  Denies any recent chest pain.  No indication for ischemic evaluation.    3. Hyperlipidemia: LDL was 125 in 10/2022.  The 10-year ASCVD risk score (Arnett DK, et al., 2019) is: 0.4%. Not on statin therapy.    4. History of snoring: She underwent home sleep study in 12/2022 which was nondiagnostic due to lack of adequate sleep time.  In-lab sleep study was recommended, but not completed.  We will reach out to our sleep medicine team to see if she can be scheduled for in lab study.   5. Mild PAD: ABIs in June 2022 showed mild PAD bilaterally. Denies claudication. No indication for repeat study at this time.    6. Irregular bleeding/history of anemia: She reports a history of irregular bleeding, mild anemia.  She is requesting to have her iron  levels checked.  Will check iron , TIBC, ferritin.  Recommend follow-up with PCP.  7. Disposition: Follow-up in 4 to 6 weeks with Dr. Pietro.      Perkins JAYSON Daneen, NP 06/14/2024, 2:10 PM

## 2024-06-15 LAB — BASIC METABOLIC PANEL WITH GFR
BUN/Creatinine Ratio: 11 (ref 9–23)
BUN: 8 mg/dL (ref 6–24)
CO2: 20 mmol/L (ref 20–29)
Calcium: 9.7 mg/dL (ref 8.7–10.2)
Chloride: 105 mmol/L (ref 96–106)
Creatinine, Ser: 0.7 mg/dL (ref 0.57–1.00)
Glucose: 87 mg/dL (ref 70–99)
Potassium: 4.2 mmol/L (ref 3.5–5.2)
Sodium: 141 mmol/L (ref 134–144)
eGFR: 111 mL/min/1.73 (ref >59)

## 2024-06-15 LAB — MAGNESIUM: Magnesium: 2 mg/dL (ref 1.6–2.3)

## 2024-06-15 LAB — IRON,TIBC AND FERRITIN PANEL
Ferritin: 14 ng/mL — ABNORMAL LOW (ref 15–150)
Iron Saturation: 8 % — CL (ref 15–55)
Iron: 34 ug/dL (ref 27–159)
Total Iron Binding Capacity: 423 ug/dL (ref 250–450)
UIBC: 389 ug/dL (ref 131–425)

## 2024-06-15 LAB — CBC
Hematocrit: 39.6 % (ref 34.0–46.6)
Hemoglobin: 12.7 g/dL (ref 11.1–15.9)
MCH: 29.7 pg (ref 26.6–33.0)
MCHC: 32.1 g/dL (ref 31.5–35.7)
MCV: 93 fL (ref 79–97)
Platelets: 313 x10E3/uL (ref 150–450)
RBC: 4.27 x10E6/uL (ref 3.77–5.28)
RDW: 12.6 % (ref 11.7–15.4)
WBC: 4.7 x10E3/uL (ref 3.4–10.8)

## 2024-06-15 LAB — TSH: TSH: 1.08 u[IU]/mL (ref 0.450–4.500)

## 2024-06-17 ENCOUNTER — Ambulatory Visit: Payer: Self-pay | Admitting: Nurse Practitioner

## 2024-06-20 NOTE — Telephone Encounter (Signed)
**Note De-Identified Lakeyn Dokken Obfuscation** Per letter from the [employer] Provider Portal, they have approved this NPSG PA from 06/14/2024 to 09/12/2024. Authorization Number: [id number] I have transferred the order to the sleep lab.  I called the pt and made her aware of this approval and I provided her with the Sleep Lab's phone number so she can call them to be scheduled.  The pt verbalized understanding to all information given and thanked me for my call.

## 2024-06-21 ENCOUNTER — Telehealth: Payer: Self-pay | Admitting: Adult Health

## 2024-06-21 ENCOUNTER — Encounter: Payer: Self-pay | Admitting: Adult Health

## 2024-06-21 ENCOUNTER — Ambulatory Visit (INDEPENDENT_AMBULATORY_CARE_PROVIDER_SITE_OTHER): Admitting: Adult Health

## 2024-06-21 VITALS — BP 129/84 | HR 111 | Ht 65.0 in | Wt 121.5 lb

## 2024-06-21 DIAGNOSIS — N920 Excessive and frequent menstruation with regular cycle: Secondary | ICD-10-CM

## 2024-06-21 DIAGNOSIS — R790 Abnormal level of blood mineral: Secondary | ICD-10-CM | POA: Diagnosis not present

## 2024-06-21 MED ORDER — IRON 325 (65 FE) MG PO TABS: ORAL_TABLET | ORAL | Status: AC

## 2024-06-21 NOTE — Progress Notes (Signed)
  Subjective:     Patient ID: Paula Riley, female   DOB: 1981/07/27, 43 y.o.   MRN: [id number]  HPI Paula Riley is a 43 year old black female,with DP, [id number] in to talk about low ferritin level on labs, was 43, 06/14/24. HGB was 12.7, she had drawn when seeing cardiologist. She has had iron  infusions in the past. She has ParaGard  IUD, and periods last 5-7 days with 2 heavy days, may change pad every 3-4 hours.      Component Value Date/Time   DIAGPAP  07/16/2023 0937    - Negative for Intraepithelial Lesions or Malignancy (NILM)   DIAGPAP - Benign reactive/reparative changes 07/16/2023 0937   DIAGPAP  09/03/2020 1428    - Negative for intraepithelial lesion or malignancy (NILM)   HPVHIGH Negative 07/16/2023 0937   HPVHIGH Negative 09/03/2020 1428   HPVHIGH Negative 09/01/2019 1447   ADEQPAP  07/16/2023 0937    Satisfactory for evaluation; transformation zone component PRESENT.   ADEQPAP  09/03/2020 1428    Satisfactory for evaluation; transformation zone component ABSENT.   ADEQPAP  09/01/2019 1447    Satisfactory for evaluation; transformation zone component PRESENT.    PCP is GORMAN Mace PA Review of Systems +periods last 5-7 days with 2 heavy days, may change pad every 3-4 hours.    She denies any fatigue, shortness of breath, dizziness or headache. Reviewed past medical,surgical, social and family history. Reviewed medications and allergies.  Objective:   Physical Exam BP 129/84 (BP Location: Left Arm, Patient Position: Sitting, Cuff Size: Normal)   Pulse (!) 111   Ht 5' 5 (1.651 m)   Wt 121 lb 8 oz (55.1 kg)   LMP 06/03/2024 (Exact Date)   BMI 20.22 kg/m     Skin warm and dry. Lungs: clear to ausculation bilaterally. Cardiovascular: regular rate and rhythm.  Upstream - 06/21/24 1116       Pregnancy Intention Screening   Does the patient want to become pregnant in the next year? No    Does the patient's partner want to become pregnant in the next year? No       Contraception Wrap Up   Current Method IUD or IUS    End Method IUD or IUS    Contraception Counseling Provided Yes           Assessment:     1. Low ferritin level (Primary) Take OTC iron  supplement and eat iron  rich foods, she does not eat red meat Meds ordered this encounter  Medications   Ferrous Sulfate  (IRON ) 325 (65 Fe) MG TABS    Sig: Take 1 daily    Supervising Provider:   JAYNE MINDER H [[id number]]   Review handout on iron  rich diet  2. Menorrhagia with regular cycle Discussed changing IUD to mirena, endometrial ablation or taking birth control pills, she declines all of this Could try aygestin to control bleeding if ferritin continues to be low    Plan:     Follow up in 6 weeks for ROS and will recheck CBC and iron  panel then

## 2024-06-21 NOTE — Telephone Encounter (Signed)
 Pt aware she needs to schedule an appt with a provider to discuss. Pt voiced understanding and call was transferred to [facility] for appt. JSY

## 2024-06-21 NOTE — Telephone Encounter (Signed)
 Patient calling that her iron  is low after her visit with her cardo and they wanted her to follow up with you to see what she needs to do patient asking for call back

## 2024-06-27 ENCOUNTER — Telehealth: Payer: Self-pay | Admitting: Adult Health

## 2024-06-27 ENCOUNTER — Telehealth: Payer: Self-pay | Admitting: Nurse Practitioner

## 2024-06-27 NOTE — Telephone Encounter (Signed)
 Pt states she is unclear about her iron . Is she suppose to get it otc or will a prescription be called in? Please call pt

## 2024-06-27 NOTE — Telephone Encounter (Signed)
  1. Is this related to a heart monitor you are wearing?  (If the patient says no, please ask     if they are caling about ICD/pacemaker.) Heart Monitor  2. What is your issue?? Pt lost her key to her mailbox but the Health and safety inspector is suppose to give her a new key today. Should she go ahead and put the monitor on because she is getting messages that she needs to return it   Please route to covering RN/CMA/RMA for results. Route to monitor technicians or your monitor tech representative for your site for any technical concerns

## 2024-06-27 NOTE — Telephone Encounter (Signed)
 Pt aware the iron  she needs to take is OTC. 325 mg daily. Pt voiced understanding. JSY

## 2024-06-28 NOTE — Telephone Encounter (Signed)
 Pt f/u in regards to her heart monitor. Please advise.

## 2024-06-28 NOTE — Telephone Encounter (Signed)
 Returned call and answered all her questions. Patient will apply monitor as soon as she gets a key to her mailbox

## 2024-06-29 ENCOUNTER — Encounter (HOSPITAL_COMMUNITY): Payer: Self-pay

## 2024-06-29 ENCOUNTER — Other Ambulatory Visit: Payer: Self-pay

## 2024-06-29 ENCOUNTER — Emergency Department (HOSPITAL_COMMUNITY)
Admission: EM | Admit: 2024-06-29 | Discharge: 2024-06-29 | Disposition: A | Discharge status: Home / Self Care | Attending: Emergency Medicine | Admitting: Emergency Medicine

## 2024-06-29 ENCOUNTER — Emergency Department (HOSPITAL_COMMUNITY)

## 2024-06-29 DIAGNOSIS — R079 Chest pain, unspecified: Secondary | ICD-10-CM

## 2024-06-29 DIAGNOSIS — E871 Hypo-osmolality and hyponatremia: Secondary | ICD-10-CM | POA: Diagnosis not present

## 2024-06-29 DIAGNOSIS — R0789 Other chest pain: Secondary | ICD-10-CM | POA: Insufficient documentation

## 2024-06-29 LAB — BASIC METABOLIC PANEL WITH GFR
Anion gap: 9 (ref 5–15)
BUN: 16 mg/dL (ref 6–20)
CO2: 22 mmol/L (ref 22–32)
Calcium: 9.3 mg/dL (ref 8.9–10.3)
Chloride: 103 mmol/L (ref 98–111)
Creatinine, Ser: 0.67 mg/dL (ref 0.44–1.00)
GFR, Estimated: >60 mL/min (ref >60)
Glucose, Bld: 105 mg/dL — ABNORMAL HIGH (ref 70–99)
Potassium: 4 mmol/L (ref 3.5–5.1)
Sodium: 134 mmol/L — ABNORMAL LOW (ref 135–145)

## 2024-06-29 LAB — HEPATIC FUNCTION PANEL
ALT: 15 U/L (ref 0–44)
AST: 25 U/L (ref 15–41)
Albumin: 4.2 g/dL (ref 3.5–5.0)
Alkaline Phosphatase: 31 U/L — ABNORMAL LOW (ref 38–126)
Bilirubin, Direct: 0.1 mg/dL (ref 0.0–0.2)
Indirect Bilirubin: 0.1 mg/dL — ABNORMAL LOW (ref 0.3–0.9)
Total Bilirubin: 0.3 mg/dL (ref 0.0–1.2)
Total Protein: 6.8 g/dL (ref 6.5–8.1)

## 2024-06-29 LAB — CBC
HCT: 36.1 % (ref 36.0–46.0)
Hemoglobin: 11.8 g/dL — ABNORMAL LOW (ref 12.0–15.0)
MCH: 30.3 pg (ref 26.0–34.0)
MCHC: 32.7 g/dL (ref 30.0–36.0)
MCV: 92.8 fL (ref 80.0–100.0)
Platelets: 350 K/uL (ref 150–400)
RBC: 3.89 MIL/uL (ref 3.87–5.11)
RDW: 13.8 % (ref 11.5–15.5)
WBC: 5.8 K/uL (ref 4.0–10.5)
nRBC: 0 % (ref 0.0–0.2)

## 2024-06-29 LAB — TROPONIN T, HIGH SENSITIVITY
Troponin T High Sensitivity: <15 ng/L (ref 0–19)
Troponin T High Sensitivity: <15 ng/L (ref 0–19)

## 2024-06-29 LAB — MAGNESIUM: Magnesium: 1.9 mg/dL (ref 1.7–2.4)

## 2024-06-29 LAB — LIPASE, BLOOD: Lipase: 37 U/L (ref 11–51)

## 2024-06-29 NOTE — Discharge Instructions (Signed)
 Your test results today are reassuring.  Return to the emergency department for any new or worsening symptoms of concern.

## 2024-06-29 NOTE — ED Provider Notes (Signed)
 [facility] EMERGENCY DEPARTMENT AT [facility] Provider Note   CSN: [id number] Arrival date & time: 06/29/24  2047     Patient presents with: Chest Pain   Denver Harder is a 43 y.o. female.    Chest Pain Patient presents for chest pain.  Medical history includes GERD, anxiety, anemia.  Earlier today, she had onset of pain across the anterior aspect of her chest.  It did resolve but then recurred again this evening.  At the time, she was at work.  She denies any significant exertion at the time.  Pain has since resolved.  She denies any symptoms currently.     Prior to Admission medications   Medication Sig Start Date End Date Taking? Authorizing Provider  Acetaminophen  (TYLENOL  PO) Take by mouth.    [provider]  clonazePAM (KLONOPIN) 0.5 MG tablet Take 0.5 mg by mouth daily as needed. 10/16/22   [provider]  Ferrous Sulfate  (IRON ) 325 (65 Fe) MG TABS Take 1 daily 06/21/24   Signa Nest A, NP  metoprolol  succinate (TOPROL -XL) 50 MG 24 hr tablet TAKE 2 TABLETS BY MOUTH EVERY DAY WITH OR IMMEDIATELY FOLLOWING A MEAL 01/11/24   Daneen Perkins C, NP  PARAGARD  INTRAUTERINE COPPER  IU by Intrauterine route.    [provider]    Allergies: Patient has no known allergies.    Review of Systems  Cardiovascular:  Positive for chest pain.  All other systems reviewed and are negative.   Updated Vital Signs BP 125/81   Pulse 93   Temp 97.8 F (36.6 C) (Temporal)   Resp 18   Ht 5' 5 (1.651 m)   Wt 54.4 kg   LMP 06/03/2024 (Exact Date)   SpO2 100%   BMI 19.97 kg/m   Physical Exam Vitals and nursing note reviewed.  Constitutional:      General: She is not in acute distress.    Appearance: She is well-developed. She is not ill-appearing, toxic-appearing or diaphoretic.  HENT:     Head: Normocephalic and atraumatic.  Eyes:     Conjunctiva/sclera: Conjunctivae normal.  Cardiovascular:     Rate and Rhythm: Normal rate and regular  rhythm.     Heart sounds: No murmur heard. Pulmonary:     Effort: Pulmonary effort is normal. No respiratory distress.     Breath sounds: Normal breath sounds. No decreased breath sounds, wheezing, rhonchi or rales.  Chest:     Chest wall: Tenderness present.  Abdominal:     Palpations: Abdomen is soft.     Tenderness: There is no abdominal tenderness.  Musculoskeletal:        General: No swelling. Normal range of motion.     Cervical back: Normal range of motion and neck supple.  Skin:    General: Skin is warm and dry.     Coloration: Skin is not cyanotic or pale.  Neurological:     General: No focal deficit present.     Mental Status: She is alert and oriented to person, place, and time.  Psychiatric:        Mood and Affect: Mood normal.        Behavior: Behavior normal.     (all labs ordered are listed, but only abnormal results are displayed) Labs Reviewed  BASIC METABOLIC PANEL WITH GFR - Abnormal; Notable for the following components:      Result Value   Sodium 134 (*)    Glucose, Bld 105 (*)    All other components  within normal limits  CBC - Abnormal; Notable for the following components:   Hemoglobin 11.8 (*)    All other components within normal limits  HEPATIC FUNCTION PANEL - Abnormal; Notable for the following components:   Alkaline Phosphatase 31 (*)    Indirect Bilirubin 0.1 (*)    All other components within normal limits  MAGNESIUM   LIPASE, BLOOD  POC URINE PREG, ED  TROPONIN T, HIGH SENSITIVITY  TROPONIN T, HIGH SENSITIVITY    EKG: EKG Interpretation Date/Time:  Wednesday June 29 2024 20:59:07 EDT Ventricular Rate:  99 PR Interval:  164 QRS Duration:  68 QT Interval:  342 QTC Calculation: 438 R Axis:   77  Text Interpretation: Normal sinus rhythm Confirmed by Melvenia Motto [phone]) on 06/29/2024 9:34:35 PM  Radiology: [facility] Chest 2 View Result Date: 06/29/2024 EXAM: 2 VIEW(S) XRAY OF THE CHEST 06/29/2024 09:20:00 PM COMPARISON: Chest x-ray  12/09/2023. CLINICAL HISTORY: chest pain. Sudden onset chest pain/discomfort (AP) - h/x of heart palpitations (seen her cardiologist 2 wks ago) FINDINGS: LUNGS AND PLEURA: No focal pulmonary opacity. No pulmonary edema. No pleural effusion. No pneumothorax. HEART AND MEDIASTINUM: No acute abnormality of the cardiac and mediastinal silhouettes. BONES AND SOFT TISSUES: No acute osseous abnormality. IMPRESSION: 1. No acute cardiopulmonary disease. Electronically signed by: Greig Pique MD 06/29/2024 09:26 PM EDT RP Workstation: [id number]     Procedures   Medications Ordered in the ED - No data to display                                  Medical Decision Making Amount and/or Complexity of Data Reviewed Labs: ordered. Radiology: ordered.   Patient presenting for anterior chest pain.  She had 2 transient episodes of this today.  On arrival in the ED, vital signs are normal.  Patient is well-appearing on exam.  She denies any current symptoms.  She does have some mild tenderness to anterior chest.  EKG shows normal sinus rhythm without any concerning ST segment abnormalities.  I suspect musculoskeletal etiology.  Laboratory workup was initiated.  Lab work was reassuring, including normal troponins x 2.  Patient was discharged in good condition.     Final diagnoses:  Chest pain, unspecified type    ED Discharge Orders     None          Melvenia Motto, MD 06/29/24 2335

## 2024-06-29 NOTE — ED Triage Notes (Signed)
 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

## 2024-07-19 DIAGNOSIS — R002 Palpitations: Secondary | ICD-10-CM

## 2024-08-02 ENCOUNTER — Ambulatory Visit: Admitting: Adult Health

## 2024-08-10 NOTE — Progress Notes (Signed)
 HPI: FU palpitations. Previous monitor in October of 2011 showed no significant arrhythmias. Cardionet 3/13 revealed sinus to sinus tachycardia with PACs. Repeat 3/16 showed sinus with pacs. Holter monitor 8/19 showed sinus bradycardia, normal sinus rhythm, sinus tachycardia, occasional PAC and rare PVC. Echocardiogram August 2020 showed normal LV systolic function.  ABIs June 2022 mild bilaterally.  Seen in the emergency room October 2025 with chest pain.  Electrocardiogram showed sinus rhythm with no ST changes.  Troponins were normal, liver functions unrevealing, hemoglobin 11.8.  Monitor repeated November 2025 and showed sinus rhythm with occasional PAC, 1 4 beat run of SVT, occasional PVC, isolated triplet.  Since last seen, she denies dyspnea, recurrent chest pain or syncope.  Occasional palpitations unchanged.  Current Outpatient Medications  Medication Sig Dispense Refill   Acetaminophen  (TYLENOL  PO) Take by mouth.     clonazePAM (KLONOPIN) 0.5 MG tablet Take 0.5 mg by mouth daily as needed.     Ferrous Sulfate  (IRON ) 325 (65 Fe) MG TABS Take 1 daily     metoprolol  succinate (TOPROL -XL) 50 MG 24 hr tablet TAKE 2 TABLETS BY MOUTH EVERY DAY WITH OR IMMEDIATELY FOLLOWING A MEAL 180 tablet 3   PARAGARD  INTRAUTERINE COPPER  IU by Intrauterine route.     Current Facility-Administered Medications  Medication Dose Route Frequency Provider Last Rate Last Admin   paragard  intrauterine copper  IUD 1 each  1 each Intrauterine Once          Past Medical History:  Diagnosis Date   Anxiety    GERD (gastroesophageal reflux disease)    Heart murmur    History of Holter monitoring 06/2010 and 11/2011   Cardionet montior with sinus tachycardia, PAC's only, no arrhythmias either monitor   Hx of echocardiogram 06/2010   normal   Mental disorder    Palpitations    Pregnancy induced hypertension     Past Surgical History:  Procedure Laterality Date   DENTAL SURGERY     INDUCED ABORTION       Social History   Socioeconomic History   Marital status: Media Planner    Spouse name: Not on file   Number of children: 3   Years of education: 13   Highest education level: Some college, no degree  Occupational History   Occupation: [employer]  Tobacco Use   Smoking status: Never   Smokeless tobacco: Never  Vaping Use   Vaping status: Never Used  Substance and Sexual Activity   Alcohol use: No    Alcohol/week: 0.0 standard drinks of alcohol   Drug use: No   Sexual activity: Yes    Birth control/protection: I.U.D.  Other Topics Concern   Not on file  Social History Narrative   Lives with 2 children in an apartment on the second floor.     Works at [employer].  Education: high school.   Social Drivers of Corporate Investment Banker Strain: Low Risk  (09/03/2020)   Overall Financial Resource Strain (CARDIA)    Difficulty of Paying Living Expenses: Not hard at all  Food Insecurity: No Food Insecurity (09/03/2020)   Hunger Vital Sign    Worried About Running Out of Food in the Last Year: Never true    Ran Out of Food in the Last Year: Never true  Transportation Needs: No Transportation Needs (09/03/2020)   PRAPARE - Administrator, Civil Service (Medical): No    Lack of Transportation (Non-Medical): No  Physical Activity: Insufficiently Active (09/03/2020)   Exercise  Vital Sign    Days of Exercise per Week: 1 day    Minutes of Exercise per Session: 10 min  Stress: Stress Concern Present (09/03/2020)   [employer] of Occupational Health - Occupational Stress Questionnaire    Feeling of Stress : Very much  Social Connections: Moderately Integrated (09/03/2020)   Social Connection and Isolation Panel    Frequency of Communication with Friends and Family: More than three times a week    Frequency of Social Gatherings with Friends and Family: Twice a week    Attends Religious Services: 1 to 4 times per year    Active Member of [employer] or Organizations: No     Attends Banker Meetings: Never    Marital Status: Living with partner  Intimate Partner Violence: Not At Risk (09/03/2020)   Humiliation, Afraid, Rape, and Kick questionnaire    Fear of Current or Ex-Partner: No    Emotionally Abused: No    Physically Abused: No    Sexually Abused: No    Family History  Problem Relation Age of Onset   Hypertension Other    CAD Other    Diabetes Maternal Grandmother    Breast cancer Maternal Grandmother    Healthy Son    Healthy Sister        x 6   Healthy Sister        x 2   Healthy Son     ROS: no fevers or chills, productive cough, hemoptysis, dysphasia, odynophagia, melena, hematochezia, dysuria, hematuria, rash, seizure activity, orthopnea, PND, pedal edema, claudication. Remaining systems are negative.  Physical Exam: Well-developed well-nourished in no acute distress.  Skin is warm and dry.  HEENT is normal.  Neck is supple.  Chest is clear to auscultation with normal expansion.  Cardiovascular exam is regular rate and rhythm.  Abdominal exam nontender or distended. No masses palpated. Extremities show no edema. neuro grossly intact  EKG Interpretation Date/Time:  Tuesday August 23 2024 08:48:28 EST Ventricular Rate:  96 PR Interval:  162 QRS Duration:  74 QT Interval:  346 QTC Calculation: 437 R Axis:   61  Text Interpretation: Normal sinus rhythm Normal ECG Confirmed by Pietro Rogue ([id number]) on 08/23/2024 8:51:46 AM    A/P  1 palpitations-previous echocardiogram showed normal LV function.  Monitor again showed PACs, PVCs and 3 beats of nonsustained ventricular tachycardia.  Continue beta-blocker.  2 history of atypical chest pain-recent symptoms atypical, electrocardiogram showed no diagnostic ST changes and troponin is normal.  3 hyperlipidemia-continue diet.  4 severe anxiety-follow-up primary care.  Rogue Pietro, MD

## 2024-08-16 ENCOUNTER — Ambulatory Visit: Admitting: Adult Health

## 2024-08-16 ENCOUNTER — Encounter: Payer: Self-pay | Admitting: Adult Health

## 2024-08-16 VITALS — BP 122/79 | HR 87 | Ht 65.0 in | Wt 118.5 lb

## 2024-08-16 DIAGNOSIS — R790 Abnormal level of blood mineral: Secondary | ICD-10-CM

## 2024-08-16 DIAGNOSIS — N920 Excessive and frequent menstruation with regular cycle: Secondary | ICD-10-CM

## 2024-08-16 NOTE — Progress Notes (Addendum)
  Subjective:     Patient ID: Remigio Merles, female   DOB: May 02, 1981, 43 y.o.   MRN: [id number]  HPI Geana is a 43 year old black female, with DP, [id number] back in follow up on taking iron  and she stopped due to GI upset, has tried to eat more spinach, does not eat meat.    Component Value Date/Time   DIAGPAP  07/16/2023 0937    - Negative for Intraepithelial Lesions or Malignancy (NILM)   DIAGPAP - Benign reactive/reparative changes 07/16/2023 0937   DIAGPAP  09/03/2020 1428    - Negative for intraepithelial lesion or malignancy (NILM)   HPVHIGH Negative 07/16/2023 0937   HPVHIGH Negative 09/03/2020 1428   HPVHIGH Negative 09/01/2019 1447   ADEQPAP  07/16/2023 0937    Satisfactory for evaluation; transformation zone component PRESENT.   ADEQPAP  09/03/2020 1428    Satisfactory for evaluation; transformation zone component ABSENT.   ADEQPAP  09/01/2019 1447    Satisfactory for evaluation; transformation zone component PRESENT.    Review of Systems Denies any headaches, dizziness, shortness of breath or being tired. Periods last about 7 days, will spot 2 days be heavy 2 days then spot about 3 days Reviewed past medical,surgical, social and family history. Reviewed medications and allergies.     Objective:   Physical Exam BP 122/79 (BP Location: Right Arm, Patient Position: Sitting, Cuff Size: Normal)   Pulse 87   Ht 5' 5 (1.651 m)   Wt 118 lb 8 oz (53.8 kg)   LMP 07/29/2024 (Exact Date)   BMI 19.72 kg/m     Skin warm and dry.  Lungs: clear to ausculation bilaterally. Cardiovascular: regular rate and rhythm.   Upstream - 08/16/24 0919       Pregnancy Intention Screening   Does the patient want to become pregnant in the next year? No    Does the patient's partner want to become pregnant in the next year? No    Would the patient like to discuss contraceptive options today? No      Contraception Wrap Up   Current Method IUD or IUS    End Method IUD or IUS    Contraception  Counseling Provided Yes          Assessment:     1. Menorrhagia with regular cycle (Primary) Periods last about 7 days, will spot 2 days be heavy 2 days then spot about 3 days, has [name] IUD Will check CBC and iron  panel - CBC - Iron , TIBC and Ferritin Panel  2. Low ferritin level Check iron  panel, try taking 2 Flintstone complete with iron  daily  - Iron , TIBC and Ferritin Panel     Plan:    Will talk when labs back, she does not use mychart  Follow up prn

## 2024-08-17 ENCOUNTER — Ambulatory Visit: Payer: Self-pay | Admitting: Adult Health

## 2024-08-17 ENCOUNTER — Other Ambulatory Visit: Payer: Self-pay | Admitting: Adult Health

## 2024-08-17 DIAGNOSIS — D500 Iron deficiency anemia secondary to blood loss (chronic): Secondary | ICD-10-CM

## 2024-08-17 LAB — CBC
Hematocrit: 39.3 % (ref 34.0–46.6)
Hemoglobin: 12.6 g/dL (ref 11.1–15.9)
MCH: 30.1 pg (ref 26.6–33.0)
MCHC: 32.1 g/dL (ref 31.5–35.7)
MCV: 94 fL (ref 79–97)
Platelets: 318 x10E3/uL (ref 150–450)
RBC: 4.18 x10E6/uL (ref 3.77–5.28)
RDW: 14.1 % (ref 11.7–15.4)
WBC: 3.3 x10E3/uL — ABNORMAL LOW (ref 3.4–10.8)

## 2024-08-17 LAB — IRON,TIBC AND FERRITIN PANEL
Ferritin: 16 ng/mL (ref 15–150)
Iron Saturation: 17 % (ref 15–55)
Iron: 63 ug/dL (ref 27–159)
Total Iron Binding Capacity: 380 ug/dL (ref 250–450)
UIBC: 317 ug/dL (ref 131–425)

## 2024-08-17 NOTE — Telephone Encounter (Signed)
-----   Message from Elgin sent at 08/17/2024  8:52 AM EST ----- Let her know results, she does not do mychart well.  THX

## 2024-08-17 NOTE — Telephone Encounter (Signed)
 Pt aware of results and to take Flinstones and continue to eat iron  rich foods. Pt voiced understanding. JSY

## 2024-08-17 NOTE — Progress Notes (Signed)
 Ck CBC in 4 weeks

## 2024-08-23 ENCOUNTER — Encounter: Payer: Self-pay | Admitting: Cardiology

## 2024-08-23 ENCOUNTER — Ambulatory Visit: Attending: Cardiology | Admitting: Cardiology

## 2024-08-23 VITALS — BP 110/60 | HR 110 | Ht 65.0 in | Wt 119.0 lb

## 2024-08-23 DIAGNOSIS — E785 Hyperlipidemia, unspecified: Secondary | ICD-10-CM

## 2024-08-23 DIAGNOSIS — R000 Tachycardia, unspecified: Secondary | ICD-10-CM

## 2024-08-23 DIAGNOSIS — R0789 Other chest pain: Secondary | ICD-10-CM

## 2024-08-23 DIAGNOSIS — R002 Palpitations: Secondary | ICD-10-CM

## 2024-08-23 NOTE — Patient Instructions (Signed)

## 2024-09-01 ENCOUNTER — Ambulatory Visit (HOSPITAL_BASED_OUTPATIENT_CLINIC_OR_DEPARTMENT_OTHER): Attending: Cardiology | Admitting: Cardiology

## 2024-09-06 ENCOUNTER — Ambulatory Visit: Admission: EM | Admit: 2024-09-06 | Discharge: 2024-09-06 | Disposition: A | Discharge status: Home / Self Care

## 2024-09-06 ENCOUNTER — Encounter: Payer: Self-pay | Admitting: Emergency Medicine

## 2024-09-06 ENCOUNTER — Other Ambulatory Visit: Payer: Self-pay

## 2024-09-06 DIAGNOSIS — R198 Other specified symptoms and signs involving the digestive system and abdomen: Secondary | ICD-10-CM | POA: Diagnosis not present

## 2024-09-06 DIAGNOSIS — F411 Generalized anxiety disorder: Secondary | ICD-10-CM | POA: Diagnosis not present

## 2024-09-06 DIAGNOSIS — R195 Other fecal abnormalities: Secondary | ICD-10-CM

## 2024-09-06 NOTE — ED Triage Notes (Signed)
 Pt reports loud noises from stomach after eating. X2 weeks. Pt denies diarrhea but reports soft stools. Denies any nausea, vomiting, pain.

## 2024-09-06 NOTE — Discharge Instructions (Addendum)
 Please follow up with a primary care provider regarding the increase in your nerves lately.    Also recommend a daily probiotic and/or elimination of gluten/lactose from your diet to see if this helps with your abdominal symptoms.  Recommend close follow up with a PCP to monitor for symptom improvement.  If you develop severe abdominal pain, nausea/vomiting associated with the gurgling, or blood in your stool, please seek care in the ER.

## 2024-09-06 NOTE — ED Provider Notes (Signed)
 " [facility]    CSN: [id number] Arrival date & time: 09/06/24  0825      History   Chief Complaint Chief Complaint  Patient presents with   Diarrhea    HPI Paula Riley is a 43 y.o. female.   Patient presents today with 2-week history of bubble gut, and looser stools than normal.  She denies abdominal pain, fever, nausea/vomiting, change in appetite, blood in the stool, and urinary symptoms.  Reports the loose stools are brown in color, globby, and denies blood in the stool.  Reports there are some formed pieces of stool.  Denies recent dietary changes.  Reports symptoms initially started as constipation while taking iron  supplement, then she stopped supplement and noticed these type of stools.  Patient is visibly anxious today, reports my nerves have been tore up lately.  Tells me she has taken clonazepam in the past but primary care office recently abruptly closed and she no longer has the medication on hand.  She denies SI or HI.     Past Medical History:  Diagnosis Date   Anxiety    GERD (gastroesophageal reflux disease)    Heart murmur    History of Holter monitoring 06/2010 and 11/2011   Cardionet montior with sinus tachycardia, PAC's only, no arrhythmias either monitor   Hx of echocardiogram 06/2010   normal   Mental disorder    Palpitations    Pregnancy induced hypertension     Patient Active Problem List   Diagnosis Date Noted   Menorrhagia with regular cycle 06/21/2024   Low ferritin level 06/21/2024   Screening examination for STD (sexually transmitted disease) 04/14/2024   Irregular bleeding 04/14/2024   Negative pregnancy test 04/14/2024   IUD threads lost 10/20/2023   IUD check up 10/20/2023   Menorrhagia with irregular cycle 10/20/2023   Encounter for IUD removal and reinsertion 08/17/2023   Malpositioned intrauterine device 08/17/2023   Routine Papanicolaou smear 07/16/2023   Pregnancy examination or test, negative result  07/16/2023   Irregular intermenstrual bleeding 02/25/2021   History of abnormal cervical Pap smear 09/03/2020   Encounter for gynecological examination with Papanicolaou smear of cervix 09/03/2020   Anxiety 09/03/2020   IUD (intrauterine device) in place 03/29/2020   Anemia 12/15/2019   COVID-19 09/19/2019   LGSIL on Pap smear of cervix 09/19/2019   Breast cyst, right 10/15/2017   Anxiety about health 05/03/2015   Sinus tachycardia 08/28/2014   Palpitations 12/02/2011   GERD (gastroesophageal reflux disease) 11/13/2011   Abdominal pain, other specified site 11/13/2011    Past Surgical History:  Procedure Laterality Date   DENTAL SURGERY     INDUCED ABORTION      OB History     Gravida  4   Para  3   Term  3   Preterm      AB  1   Living  3      SAB      IAB  1   Ectopic      Multiple  0   Live Births  3            Home Medications    Prior to Admission medications  Medication Sig Start Date End Date Taking? Authorizing Provider  Acetaminophen  (TYLENOL  PO) Take by mouth.    [provider]  clonazePAM (KLONOPIN) 0.5 MG tablet Take 0.5 mg by mouth daily as needed. 10/16/22   [provider]  Ferrous Sulfate  (IRON ) 325 (65 Fe) MG TABS Take  1 daily 06/21/24   Signa Delon LABOR, NP  metoprolol  succinate (TOPROL -XL) 50 MG 24 hr tablet TAKE 2 TABLETS BY MOUTH EVERY DAY WITH OR IMMEDIATELY FOLLOWING A MEAL 01/11/24   Daneen Damien BROCKS, NP  PARAGARD  INTRAUTERINE COPPER  IU by Intrauterine route.    [provider]    Family History Family History  Problem Relation Age of Onset   Hypertension Other    CAD Other    Diabetes Maternal Grandmother    Breast cancer Maternal Grandmother    Healthy Son    Healthy Sister        x 6   Healthy Sister        x 2   Healthy Son     Social History Social History[1]   Allergies   Patient has no known allergies.   Review of Systems Review of Systems Per HPI  Physical Exam Triage  Vital Signs ED Triage Vitals  Encounter Vitals Group     BP 09/06/24 0958 128/77     Girls Systolic BP Percentile --      Girls Diastolic BP Percentile --      Boys Systolic BP Percentile --      Boys Diastolic BP Percentile --      Pulse Rate 09/06/24 0958 (!) 116     Resp 09/06/24 0958 20     Temp 09/06/24 0958 98.2 F (36.8 C)     Temp Source 09/06/24 0958 Oral     SpO2 09/06/24 0958 97 %     Weight --      Height --      Head Circumference --      Peak Flow --      Pain Score 09/06/24 1003 0     Pain Loc --      Pain Education --      Exclude from Growth Chart --    No data found.  Updated Vital Signs BP 128/77 (BP Location: Right Arm) Comment: x2 attempts.  Pulse (!) 116 Comment: pt noted to be moderately anxious. reports hx of similar when coming to doctor. NAD noted.  Temp 98.2 F (36.8 C) (Oral)   Resp 20   LMP 08/23/2024 (Exact Date)   SpO2 97%   Visual Acuity Right Eye Distance:   Left Eye Distance:   Bilateral Distance:    Right Eye Near:   Left Eye Near:    Bilateral Near:     Physical Exam Vitals and nursing note reviewed.  Constitutional:      General: She is not in acute distress.    Appearance: Normal appearance. She is not toxic-appearing.  HENT:     Head: Normocephalic and atraumatic.     Mouth/Throat:     Mouth: Mucous membranes are moist.     Pharynx: Oropharynx is clear.  Eyes:     General: No scleral icterus.    Extraocular Movements: Extraocular movements intact.  Cardiovascular:     Rate and Rhythm: Regular rhythm. Tachycardia present.  Pulmonary:     Effort: Pulmonary effort is normal. No respiratory distress.     Breath sounds: Normal breath sounds. No wheezing, rhonchi or rales.  Abdominal:     General: Abdomen is flat. Bowel sounds are normal. There is no distension.     Palpations: Abdomen is soft.     Tenderness: There is no abdominal tenderness. There is no guarding or rebound.  Musculoskeletal:     Cervical back: Normal  range of motion.  Lymphadenopathy:  Cervical: No cervical adenopathy.  Skin:    General: Skin is warm and dry.     Capillary Refill: Capillary refill takes less than 2 seconds.     Coloration: Skin is not jaundiced or pale.     Findings: No erythema.  Neurological:     Mental Status: She is alert and oriented to person, place, and time.  Psychiatric:        Mood and Affect: Mood is anxious.        Behavior: Behavior is cooperative.      UC Treatments / Results  Labs (all labs ordered are listed, but only abnormal results are displayed) Labs Reviewed - No data to display  EKG   Radiology No results found.  Procedures Procedures (including critical care time)  Medications Ordered in UC Medications - No data to display  Initial Impression / Assessment and Plan / UC Course  I have reviewed the triage vital signs and the nursing notes.  Pertinent labs & imaging results that were available during my care of the patient were reviewed by me and considered in my medical decision making (see chart for details).   Patient is a pleasant, well appearing 43 year old female presenting today for digestion concerns.  Vital signs are stable and examination is reassuring today.  Patient has tachycardia at baseline and follows closely with Cardiology.  No red flags on examination - abdomen is nontender to palpation; no guarding or rebound tenderness.  We discussed difficult to determine cause of abdominal symptoms today.  Query uncontrolled anxiety versus possible food intolerance; offered treating anxiety with hydroxyzine , patient declines today.  Also discussed elimination diet from either gluten or lactose with patient,  she did not seem interested in this.  Recommended daily probiotic and follow-up with primary care provider if symptoms persist.  With worsening symptoms, ER recommended.   The patient was given the opportunity to ask questions.  All questions answered to their  satisfaction.  The patient is in agreement to this plan.   Final Clinical Impressions(s) / UC Diagnoses   Final diagnoses:  Borborygmi  Passage of loose stools  Anxiety state     Discharge Instructions      Please follow up with a primary care provider regarding the increase in your nerves lately.    Also recommend a daily probiotic and/or elimination of gluten/lactose from your diet to see if this helps with your abdominal symptoms.  Recommend close follow up with a PCP to monitor for symptom improvement.  If you develop severe abdominal pain, nausea/vomiting associated with the gurgling, or blood in your stool, please seek care in the ER.     ED Prescriptions   None    PDMP not reviewed this encounter.     [1]  Social History Tobacco Use   Smoking status: Never   Smokeless tobacco: Never  Vaping Use   Vaping status: Never Used  Substance Use Topics   Alcohol use: No    Alcohol/week: 0.0 standard drinks of alcohol   Drug use: No     Chandra Harlene LABOR, NP 09/06/24 1330  "

## 2024-10-18 ENCOUNTER — Telehealth: Payer: Self-pay | Admitting: Cardiology

## 2024-10-18 NOTE — Telephone Encounter (Signed)
" ° °  How long have you had palpitations/irregular HR/ Afib? Are you having the symptoms now? Since yesterday, yes   Are you currently experiencing lightheadedness, SOB or CP? No   Do you have a history of afib (atrial fibrillation) or irregular heart rhythm? Yes   Have you checked your BP or HR? (document readings if available): NO   Are you experiencing any other symptoms? No    Pt has been having increased palpitations since yesterday. Please advise.  "

## 2024-10-19 ENCOUNTER — Encounter: Payer: Self-pay | Admitting: Emergency Medicine

## 2024-10-19 ENCOUNTER — Ambulatory Visit: Admitting: Emergency Medicine

## 2024-10-19 ENCOUNTER — Telehealth: Payer: Self-pay

## 2024-10-19 VITALS — BP 120/70 | HR 111 | Ht 65.0 in | Wt 117.8 lb

## 2024-10-19 DIAGNOSIS — R000 Tachycardia, unspecified: Secondary | ICD-10-CM

## 2024-10-19 DIAGNOSIS — R0789 Other chest pain: Secondary | ICD-10-CM | POA: Diagnosis not present

## 2024-10-19 DIAGNOSIS — E785 Hyperlipidemia, unspecified: Secondary | ICD-10-CM

## 2024-10-19 DIAGNOSIS — R002 Palpitations: Secondary | ICD-10-CM | POA: Diagnosis not present

## 2024-10-19 NOTE — Progress Notes (Signed)
 " Cardiology Office Note:    Date:  10/19/2024  ID:  Paula Riley, DOB 11-30-1980, MRN [id number] PCP: Patient, No Pcp Per  [facility] Providers Cardiologist:  Redell Shallow, MD       Patient Profile:       Chief Complaint: Acute visit for palpitations History of Present Illness:  Paula Riley is a 44 y.o. female with visit-pertinent history of palpitations, PACs and PVCs, atypical chest pain, hyperlipidemia, mild PAD, anxiety  Heart monitor in October 2011 showed no significant arrhythmia.  CardioNet in 2013 revealed sinus rhythm to sinus tachycardia with PACs..  Repeat in 2016 showed sinus with PACs.  Holter monitor in 2019 showed sinus bradycardia, normal sinus rhythm, sinus tachycardia, occasional PACs and rare PVCs.  Echocardiogram in August 2020 showed normal LV systolic function.  ABIs in June 2022 showed mild peripheral arterial disease bilaterally.  Patient underwent sleep study in 12/2022 which was nondiagnostic due to lack of adequate sleep time.  Heart monitor repeated November 2025 and showed sinus rhythm with occasional PAC, one 4 beat run of SVT, occasional PVC, isolated triplet.  Last seen in clinic by Dr. Shallow on 08/23/2024.  No changes were made.  Patient was to continue beta-blocking therapy.   Discussed the use of AI scribe software for clinical note transcription with the patient, who gave verbal consent to proceed.  History of Present Illness [name] is a 44 year old female with premature atrial contractions (PACs) and premature ventricular contractions (PVCs) who presents with increased palpitations.  Over the past few days she has had more frequent palpitations that occur suddenly, feel like extra beats, and are intermittent throughout the day. Symptoms improve with lying down and rest.   She takes metoprolol  50 mg twice daily, which slows but does not eliminate the palpitations. They are more noticeable with physical activity and at work and lessen  with rest. She avoids caffeine and sodas, drinks mostly water, and identifies stress and anxiety as triggers.  She has anxiety attacks and recently used Klonopin, which reduces worry but not the palpitations. She describes a cycle where anxiety worsens palpitations, which then increases her anxiety.  She works multiple jobs, retail banker, and notes palpitations are more pronounced during her busier work periods and improve when she can rest.  She denies chest pains, dyspnea, orthopnea, syncope, presyncope   Review of systems:  Please see the history of present illness. All other systems are reviewed and otherwise negative.      Studies Reviewed:    EKG Interpretation Date/Time:  Wednesday October 19 2024 11:23:19 EST Ventricular Rate:  107 PR Interval:  150 QRS Duration:  72 QT Interval:  336 QTC Calculation: 448 R Axis:   66  Text Interpretation: Sinus tachycardia with frequent Premature ventricular complexes When compared with ECG of 23-Aug-2024 08:48, Premature ventricular complexes are now Present Confirmed by Rana Dixon [id number]) on 10/19/2024 11:28:41 AM    ZIO 06/14/2024 Patch Wear Time:  10 days and 1 hours (2025-10-16T14:12:16-0400 to 2025-10-26T15:54:55-0400)   Patient had a min HR of 63 bpm, max HR of 160 bpm, and avg HR of 88 bpm. Predominant underlying rhythm was Sinus Rhythm. 1 run of Supraventricular Tachycardia occurred lasting 4 beats with a max rate of 160 bpm (avg 155 bpm). Isolated SVEs were rare  (<1.0%), SVE Couplets were rare (<1.0%), and no SVE Triplets were present. Isolated VEs were rare (<1.0%, 93), VE Triplets were rare (<1.0%, 1), and no VE Couplets were present.  NSR, sinus tachycardia, occasional PAC, one 4 beat run of SVT, occasional PVC, isolated triplet. Redell Shallow  Echocardiogram 05/12/2019 1. The left ventricle has normal systolic function, with an ejection  fraction of 55-60%. The cavity size was normal. Left  ventricular diastolic  parameters were normal.   2. The right ventricle has normal systolic function. The cavity was  normal. There is no increase in right ventricular wall thickness.   3. The aorta is normal unless otherwise noted.   4. The aortic root is normal in size and structure.   Holter monitor 05/10/2018 Sinus bradycardia, normal sinus rhythm, sinus tachycardia, occasional PAC, rare PVC  Risk Assessment/Calculations:              Physical Exam:   VS:  BP 120/70   Pulse (!) 111   Ht 5' 5 (1.651 m)   Wt 117 lb 12.8 oz (53.4 kg)   SpO2 99%   BMI 19.60 kg/m    Wt Readings from Last 3 Encounters:  10/19/24 117 lb 12.8 oz (53.4 kg)  08/23/24 119 lb (54 kg)  08/16/24 118 lb 8 oz (53.8 kg)    GEN: Well nourished, well developed in no acute distress NECK: No JVD; No carotid bruits CARDIAC: RRR, no murmurs, rubs, gallops RESPIRATORY:  Clear to auscultation without rales, wheezing or rhonchi  ABDOMEN: Soft, non-tender, non-distended EXTREMITIES:  No edema; No acute deformity      Assessment and Plan:  Palpitations Prior monitors showed sinus rhythm with PACs/PVCs Zio 07/2024 showed NSR, sinus tachycardia, occasional PVC, one 4 beat run of SVT, occasional PVC - EKG today shows sinus tachycardia with PVCs - Over the past few days she has had more frequent palpitations described as extra beats which are more noticeable with physical activity and at work and improve while at rest.  She describes a cycle where anxiety worsens her palpitations.  Taking Klonopin as needed - We discussed possibility of transitioning from metoprolol  to propranolol in the future - We discussed trialing magnesium  glycinate nightly to improve her symptoms - She will continue to work with PCP for anxiety management, work on sleep hygiene, and staying adequately hydrated - Continue metoprolol  succinate 50 mg twice daily  Atypical chest pain History of prior complaints of chest pains Echo in August  2020 showed normal LV function - Today she is stable without chest pains.  No indication for further ischemic evaluation at this time  Hyperlipidemia LDL 125 on 10/2023 - Managed with diet and exercise - The 10-year ASCVD risk score (Arnett DK, et al., 2019) is: 0.3%       Dispo: Follow-up as scheduled with Dr. Shallow  Signed, Lum LITTIE Louis, NP  "

## 2024-10-19 NOTE — Patient Instructions (Addendum)
 Medication Instructions:  No medication changes were made during today's visit.   Take Magnesium  Glycinate 120mg .you can purchase the brand name Pure Encapsulations from [employer].  *If you need a refill on your cardiac medications before your next appointment, please call your pharmacy*  Lab Work: No labs were ordered during today's visit.  If you have labs (blood work) drawn today and your tests are completely normal, you will receive your results only by: MyChart Message (if you have MyChart) OR A paper copy in the mail If you have any lab test that is abnormal or we need to change your treatment, we will call you to review the results.  Testing/Procedures: No procedures were ordered during today's visit.   Follow-Up: At [facility], you and your health needs are our priority.  As part of our continuing mission to provide you with exceptional heart care, our providers are all part of one team.  This team includes your primary Cardiologist (physician) and Advanced Practice Providers or APPs (Physician Assistants and Nurse Practitioners) who all work together to provide you with the care you need, when you need it.  Your next appointment:  follow up as needed for cardiac concerns   We recommend signing up for the patient portal called MyChart.  Sign up information is provided on this After Visit Summary.  MyChart is used to connect with patients for Virtual Visits (Telemedicine).  Patients are able to view lab/test results, encounter notes, upcoming appointments, etc.  Non-urgent messages can be sent to your provider as well.   To learn more about what you can do with MyChart, go to [email or web address].   Other Instructions

## 2024-10-21 ENCOUNTER — Telehealth: Payer: Self-pay | Admitting: Cardiology

## 2024-10-21 NOTE — Telephone Encounter (Signed)
 S/w the pt- she reports that she is still having frequent palpitations. Happens during rest and when active. Rest is not helping to decrease the palpitations. She took the metoprolol  100 mg around 5 am this morning. She reports that she does not drink anything with caffeine in it. She reports that she is well hydrated with water.   No shortness of breath or dizziness.   Informed her that I would send this information to her providers and call her back with recommendations. Given ER precautions. She verbalized understanding.

## 2024-10-21 NOTE — Telephone Encounter (Signed)
 Pt called and wanted Dr. Pietro to know she has not had any relief with her palpitations since visit on 2/4. Please advise.   Patient c/o Palpitations:  STAT if patient reporting lightheadedness, shortness of breath, or chest pain  How long have you had palpitations/irregular HR/ Afib? Are you having the symptoms now? Yes   Are you currently experiencing lightheadedness, SOB or CP? No   Do you have a history of afib (atrial fibrillation) or irregular heart rhythm? Yes   Have you checked your BP or HR? (document readings if available): No   Are you experiencing any other symptoms? No

## 2024-12-07 ENCOUNTER — Ambulatory Visit: Admitting: Physician Assistant
